# Patient Record
Sex: Male | Born: 1950
Health system: Southern US, Community
[De-identification: ages and names within clinical notes are randomized; demographics above are authoritative.]

## PROBLEM LIST (undated history)

## (undated) DIAGNOSIS — Z9889 Other specified postprocedural states: Secondary | ICD-10-CM

## (undated) DIAGNOSIS — Z860100 Personal history of colon polyps, unspecified: Secondary | ICD-10-CM

## (undated) DIAGNOSIS — I4891 Unspecified atrial fibrillation: Secondary | ICD-10-CM

## (undated) DIAGNOSIS — N400 Enlarged prostate without lower urinary tract symptoms: Secondary | ICD-10-CM

## (undated) DIAGNOSIS — R972 Elevated prostate specific antigen [PSA]: Secondary | ICD-10-CM

## (undated) DIAGNOSIS — E785 Hyperlipidemia, unspecified: Secondary | ICD-10-CM

## (undated) DIAGNOSIS — I10 Essential (primary) hypertension: Secondary | ICD-10-CM

## (undated) HISTORY — DX: Hyperlipidemia, unspecified: E78.5

## (undated) HISTORY — DX: Other specified postprocedural states: Z98.890

## (undated) HISTORY — DX: Benign prostatic hyperplasia without lower urinary tract symptoms: N40.0

## (undated) HISTORY — DX: Essential (primary) hypertension: I10

## (undated) HISTORY — DX: Personal history of colon polyps, unspecified: Z86.0100

## (undated) HISTORY — DX: Elevated prostate specific antigen (PSA): R97.20

## (undated) HISTORY — DX: Personal history of colonic polyps: Z86.010

## (undated) HISTORY — PX: APPENDECTOMY: SHX54

## (undated) HISTORY — PX: LIPOMA EXCISION: SHX5283

## (undated) HISTORY — PX: HERNIA REPAIR: SHX51

## (undated) HISTORY — PX: COLONOSCOPY W/ POLYPECTOMY: SHX1380

---

## 2005-05-14 ENCOUNTER — Ambulatory Visit: Payer: Self-pay | Admitting: Gastroenterology

## 2005-07-10 ENCOUNTER — Ambulatory Visit: Payer: Self-pay | Admitting: Gastroenterology

## 2005-07-10 ENCOUNTER — Encounter (INDEPENDENT_AMBULATORY_CARE_PROVIDER_SITE_OTHER): Payer: Self-pay | Admitting: *Deleted

## 2005-12-03 ENCOUNTER — Encounter: Admission: RE | Admit: 2005-12-03 | Discharge: 2005-12-03 | Payer: Self-pay | Admitting: Family Medicine

## 2006-02-26 ENCOUNTER — Ambulatory Visit: Payer: Self-pay | Admitting: Internal Medicine

## 2006-03-12 ENCOUNTER — Ambulatory Visit: Payer: Self-pay | Admitting: Internal Medicine

## 2006-10-14 ENCOUNTER — Ambulatory Visit: Payer: Self-pay | Admitting: Internal Medicine

## 2006-10-18 LAB — CONVERTED CEMR LAB: PSA: 5.95 ng/mL — ABNORMAL HIGH (ref 0.10–4.00)

## 2006-10-21 ENCOUNTER — Ambulatory Visit: Payer: Self-pay | Admitting: Internal Medicine

## 2007-03-16 ENCOUNTER — Ambulatory Visit: Payer: Self-pay | Admitting: Internal Medicine

## 2007-05-12 ENCOUNTER — Ambulatory Visit: Payer: Self-pay | Admitting: Internal Medicine

## 2007-05-12 DIAGNOSIS — R972 Elevated prostate specific antigen [PSA]: Secondary | ICD-10-CM

## 2007-05-12 DIAGNOSIS — E782 Mixed hyperlipidemia: Secondary | ICD-10-CM

## 2007-05-12 LAB — CONVERTED CEMR LAB
HDL goal, serum: 40 mg/dL
LDL Goal: 160 mg/dL

## 2007-05-16 LAB — CONVERTED CEMR LAB
BUN: 15 mg/dL (ref 6–23)
Creatinine, Ser: 0.9 mg/dL (ref 0.4–1.5)
Hgb A1c MFr Bld: 5.6 % (ref 4.6–6.0)
LDL Cholesterol: 96 mg/dL (ref 0–99)
Triglycerides: 68 mg/dL (ref 0–149)
VLDL: 14 mg/dL (ref 0–40)

## 2007-05-17 ENCOUNTER — Encounter (INDEPENDENT_AMBULATORY_CARE_PROVIDER_SITE_OTHER): Payer: Self-pay | Admitting: *Deleted

## 2007-12-14 ENCOUNTER — Ambulatory Visit: Payer: Self-pay | Admitting: Internal Medicine

## 2007-12-15 ENCOUNTER — Encounter (INDEPENDENT_AMBULATORY_CARE_PROVIDER_SITE_OTHER): Payer: Self-pay | Admitting: *Deleted

## 2008-01-14 ENCOUNTER — Encounter: Payer: Self-pay | Admitting: Internal Medicine

## 2008-02-21 ENCOUNTER — Ambulatory Visit (HOSPITAL_BASED_OUTPATIENT_CLINIC_OR_DEPARTMENT_OTHER): Admission: RE | Admit: 2008-02-21 | Discharge: 2008-02-21 | Payer: Self-pay | Admitting: Surgery

## 2008-02-21 ENCOUNTER — Encounter (INDEPENDENT_AMBULATORY_CARE_PROVIDER_SITE_OTHER): Payer: Self-pay | Admitting: Surgery

## 2008-03-10 ENCOUNTER — Encounter: Payer: Self-pay | Admitting: Internal Medicine

## 2008-05-15 ENCOUNTER — Ambulatory Visit: Payer: Self-pay | Admitting: Internal Medicine

## 2008-05-15 DIAGNOSIS — I10 Essential (primary) hypertension: Secondary | ICD-10-CM

## 2008-05-15 DIAGNOSIS — Z86010 Personal history of colonic polyps: Secondary | ICD-10-CM

## 2008-05-16 ENCOUNTER — Ambulatory Visit: Payer: Self-pay | Admitting: Internal Medicine

## 2008-05-22 ENCOUNTER — Encounter (INDEPENDENT_AMBULATORY_CARE_PROVIDER_SITE_OTHER): Payer: Self-pay | Admitting: *Deleted

## 2009-02-12 ENCOUNTER — Ambulatory Visit: Payer: Self-pay | Admitting: Internal Medicine

## 2009-05-17 ENCOUNTER — Ambulatory Visit: Payer: Self-pay | Admitting: Internal Medicine

## 2009-05-17 DIAGNOSIS — N400 Enlarged prostate without lower urinary tract symptoms: Secondary | ICD-10-CM

## 2009-05-21 ENCOUNTER — Encounter (INDEPENDENT_AMBULATORY_CARE_PROVIDER_SITE_OTHER): Payer: Self-pay | Admitting: *Deleted

## 2009-08-10 ENCOUNTER — Encounter (INDEPENDENT_AMBULATORY_CARE_PROVIDER_SITE_OTHER): Payer: Self-pay | Admitting: *Deleted

## 2009-08-10 ENCOUNTER — Telehealth: Payer: Self-pay | Admitting: Internal Medicine

## 2009-08-10 DIAGNOSIS — K4091 Unilateral inguinal hernia, without obstruction or gangrene, recurrent: Secondary | ICD-10-CM

## 2009-09-28 ENCOUNTER — Encounter: Payer: Self-pay | Admitting: Internal Medicine

## 2009-11-27 ENCOUNTER — Ambulatory Visit (HOSPITAL_BASED_OUTPATIENT_CLINIC_OR_DEPARTMENT_OTHER): Admission: RE | Admit: 2009-11-27 | Discharge: 2009-11-27 | Payer: Self-pay | Admitting: Surgery

## 2009-12-12 ENCOUNTER — Encounter: Payer: Self-pay | Admitting: Internal Medicine

## 2010-02-12 ENCOUNTER — Telehealth (INDEPENDENT_AMBULATORY_CARE_PROVIDER_SITE_OTHER): Payer: Self-pay | Admitting: *Deleted

## 2010-03-05 ENCOUNTER — Ambulatory Visit: Payer: Self-pay | Admitting: Internal Medicine

## 2010-05-19 DIAGNOSIS — Z9889 Other specified postprocedural states: Secondary | ICD-10-CM

## 2010-05-19 HISTORY — DX: Other specified postprocedural states: Z98.890

## 2010-06-10 ENCOUNTER — Telehealth (INDEPENDENT_AMBULATORY_CARE_PROVIDER_SITE_OTHER): Payer: Self-pay | Admitting: *Deleted

## 2010-06-14 ENCOUNTER — Ambulatory Visit
Admission: RE | Admit: 2010-06-14 | Discharge: 2010-06-14 | Payer: Self-pay | Source: Home / Self Care | Attending: Internal Medicine | Admitting: Internal Medicine

## 2010-06-14 ENCOUNTER — Other Ambulatory Visit: Payer: Self-pay | Admitting: Internal Medicine

## 2010-06-14 LAB — LIPID PANEL
Cholesterol: 178 mg/dL (ref 0–200)
HDL: 67.3 mg/dL (ref >39.00)
Triglycerides: 56 mg/dL (ref 0.0–149.0)
VLDL: 11.2 mg/dL (ref 0.0–40.0)

## 2010-06-14 LAB — CONVERTED CEMR LAB
Blood in Urine, dipstick: NEGATIVE
Glucose, Urine, Semiquant: NEGATIVE
Ketones, urine, test strip: NEGATIVE
Nitrite: NEGATIVE
Urobilinogen, UA: 0.2
WBC Urine, dipstick: NEGATIVE

## 2010-06-14 LAB — HEPATIC FUNCTION PANEL
ALT: 20 U/L (ref 0–53)
AST: 23 U/L (ref 0–37)
Alkaline Phosphatase: 49 U/L (ref 39–117)

## 2010-06-14 LAB — CBC WITH DIFFERENTIAL/PLATELET
Basophils Relative: 0.6 % (ref 0.0–3.0)
Eosinophils Absolute: 0.1 10*3/uL (ref 0.0–0.7)
Eosinophils Relative: 0.9 % (ref 0.0–5.0)
Lymphocytes Relative: 48.5 % — ABNORMAL HIGH (ref 12.0–46.0)
Lymphs Abs: 2.8 10*3/uL (ref 0.7–4.0)
MCV: 92.2 fl (ref 78.0–100.0)
Monocytes Absolute: 0.4 10*3/uL (ref 0.1–1.0)
RDW: 13.4 % (ref 11.5–14.6)

## 2010-06-14 LAB — BASIC METABOLIC PANEL
CO2: 28 mEq/L (ref 19–32)
Calcium: 9.2 mg/dL (ref 8.4–10.5)
Creatinine, Ser: 0.8 mg/dL (ref 0.4–1.5)
Glucose, Bld: 98 mg/dL (ref 70–99)

## 2010-06-14 LAB — TSH: TSH: 1.76 u[IU]/mL (ref 0.35–5.50)

## 2010-06-16 LAB — CONVERTED CEMR LAB
ALT: 20 units/L (ref 0–53)
ALT: 23 units/L (ref 0–53)
Albumin: 4.3 g/dL (ref 3.5–5.2)
BUN: 13 mg/dL (ref 6–23)
BUN: 15 mg/dL (ref 6–23)
Basophils Absolute: 0 10*3/uL (ref 0.0–0.1)
Basophils Absolute: 0 10*3/uL (ref 0.0–0.1)
Basophils Relative: 0.6 % (ref 0.0–3.0)
Bilirubin, Direct: 0.1 mg/dL (ref 0.0–0.3)
CO2: 29 meq/L (ref 19–32)
CO2: 30 meq/L (ref 19–32)
Calcium: 9.2 mg/dL (ref 8.4–10.5)
Calcium: 9.4 mg/dL (ref 8.4–10.5)
Chloride: 103 meq/L (ref 96–112)
Cholesterol: 179 mg/dL (ref 0–200)
Creatinine, Ser: 0.8 mg/dL (ref 0.4–1.5)
Creatinine, Ser: 0.8 mg/dL (ref 0.4–1.5)
Eosinophils Absolute: 0 10*3/uL (ref 0.0–0.7)
Eosinophils Absolute: 0.1 10*3/uL (ref 0.0–0.7)
Eosinophils Relative: 0.8 % (ref 0.0–5.0)
GFR calc non Af Amer: 105 mL/min
Hemoglobin: 14.8 g/dL (ref 13.0–17.0)
Lymphs Abs: 2.4 10*3/uL (ref 0.7–4.0)
MCHC: 33.9 g/dL (ref 30.0–36.0)
MCV: 91.9 fL (ref 78.0–100.0)
Monocytes Absolute: 0.4 10*3/uL (ref 0.1–1.0)
Monocytes Relative: 7.4 % (ref 3.0–12.0)
Neutro Abs: 2.7 10*3/uL (ref 1.4–7.7)
Neutrophils Relative %: 46.1 % (ref 43.0–77.0)
Neutrophils Relative %: 54.3 % (ref 43.0–77.0)
Platelets: 217 10*3/uL (ref 150–400)
Platelets: 223 10*3/uL (ref 150.0–400.0)
Potassium: 5 meq/L (ref 3.5–5.1)
RDW: 13.3 % (ref 11.5–14.6)
Total Bilirubin: 0.9 mg/dL (ref 0.3–1.2)
Total CHOL/HDL Ratio: 3.2
Triglycerides: 58 mg/dL (ref 0–149)
WBC: 5.6 10*3/uL (ref 4.5–10.5)

## 2010-06-20 NOTE — Progress Notes (Addendum)
Summary: Hernia Concerns  Phone Note Call from Patient Call back at Home Phone [phone] Call back at [phone]   Caller: Patient Summary of Call: Message left on VM, Patient was told by Dr.Arek Spadafore that he has a hernia and would like to know if we will contact Dr.Streck or should he.  Dr.Tyner Codner please advise  Shonna Chock  August 10, 2009 8:34 AM   New Problems: INGUINAL HERNIA (ICD-550.90)   New Problems: INGUINAL HERNIA (ICD-550.90)

## 2010-06-20 NOTE — Progress Notes (Addendum)
Summary: Cpx Lab Orders  Phone Note Call from Patient   Caller: Patient Summary of Call: Pt wants to come in for cpx labs on 1/27 (cpx 2/9). Please provide orders Initial call taken by: Lavell Islam,  June 10, 2010 9:26 AM  Follow-up for Phone Call        Lipid/Hep/BMP/CBCD/TSH/PSA/Udip/Stool Cards v70.0/272.2/401.9 Follow-up by: Shonna Chock CMA,  June 10, 2010 10:20 AM  Additional Follow-up for Phone Call Additional follow up Details #1::        orders added to appt Additional Follow-up by: Lavell Islam,  June 10, 2010 10:32 AM

## 2010-06-20 NOTE — Letter (Addendum)
Summary: Results Follow up Letter  [name] [facility]  [address] [place], [place] [address]   Phone: [phone]  Fax: [phone]    05/21/2009 MRN: [id number]  [name] [name] [address] [place], [place]  [address]  Dear Mr. Hevener,  The following are the results of your recent test(s):  Test         Result    Pap Smear:        Normal _____  Not Normal _____ Comments: ______________________________________________________ Cholesterol: LDL(Bad cholesterol):         Your goal is less than:         HDL (Good cholesterol):       Your goal is more than: Comments:  ______________________________________________________ Mammogram:        Normal _____  Not Normal _____ Comments:  ___________________________________________________________________ Hemoccult:        Normal _____  Not normal _______ Comments:    _____________________________________________________________________ Other Tests: Please see attached labs done on 05/17/2009    We routinely do not discuss normal results over the telephone.  If you desire a copy of the results, or you have any questions about this information we can discuss them at your next office visit.   Sincerely,

## 2010-06-20 NOTE — Letter (Addendum)
Summary: [facility]   Imported By: Lanelle Bal 12/25/2009 14:16:57  _____________________________________________________________________  External Attachment:    Type:   Image     Comment:   External Document

## 2010-06-20 NOTE — Progress Notes (Addendum)
Summary: spider bite  Phone Note Call from Patient Call back at Work Phone [phone]   Caller: Patient Summary of Call: patient called wanted to know what to do about spider bite  Follow-up for Phone Call        left message on machine needs to scheduled ov .......Marland KitchenDoristine Devoid CMA  February 12, 2010 1:34 PM   spoke w/ patient wife says that he is fine no fevers or muscle aches or weakness along w/ no streaking and spots are starting to fade away says that if he develops any further symptoms will come in to see Dr....Marland KitchenMarland KitchenDoristine Devoid CMA  February 13, 2010 11:37 AM

## 2010-06-20 NOTE — Consult Note (Addendum)
Summary: [facility]   Imported By: Lanelle Bal 10/19/2009 09:54:48  _____________________________________________________________________  External Attachment:    Type:   Image     Comment:   External Document

## 2010-06-20 NOTE — Letter (Addendum)
Summary: Primary Care Consult Scheduled Letter  [place] [facility] [place]  [address] [place], [place] [address]   Phone: [phone]  Fax: [phone]      08/10/2009 MRN: [id number]  Abhay Tworek [address] [place], [place]  [address]    Dear Mr. Besser,    We have scheduled an appointment for you.  At the recommendation of Dr. Marga Melnick, we have scheduled you a consult with Dr. Cyndia Bent of [facility] on 09-28-2009 arrive by 10:15am.  Their address is [address], [address], [place] [address]. The office phone number is [phone].  If this appointment day and time is not convenient for you, please feel free to call the office of the doctor you are being referred to at the number listed above and reschedule the appointment.    It is important for you to keep your scheduled appointments. We are here to make sure you are given good patient care.   Thank you,    Renee, Patient Care Coordinator [place] at [facility]

## 2010-06-20 NOTE — Assessment & Plan Note (Addendum)
Summary: flu shot//lch   Nurse Visit   Allergies: No Known Drug Allergies  Orders Added: 1)  Admin 1st Vaccine [90471] 2)  Flu Vaccine 54yrs + [29562] Flu Vaccine Consent Questions     Do you have a history of severe allergic reactions to this vaccine? no    Any prior history of allergic reactions to egg and/or gelatin? no    Do you have a sensitivity to the preservative Thimersol? no    Do you have a past history of Guillan-Barre Syndrome? no    Do you currently have an acute febrile illness? no    Have you ever had a severe reaction to latex? no    Vaccine information given and explained to patient? yes    Are you currently pregnant? no    Lot Number:[id number]   Exp Date:11/16/2010   Site Given  Left Deltoid IM .lbflu

## 2010-06-27 ENCOUNTER — Encounter: Payer: Self-pay | Admitting: Internal Medicine

## 2010-06-27 ENCOUNTER — Encounter (INDEPENDENT_AMBULATORY_CARE_PROVIDER_SITE_OTHER): Payer: BC Managed Care – PPO | Admitting: Internal Medicine

## 2010-06-27 DIAGNOSIS — R972 Elevated prostate specific antigen [PSA]: Secondary | ICD-10-CM

## 2010-06-27 DIAGNOSIS — N400 Enlarged prostate without lower urinary tract symptoms: Secondary | ICD-10-CM

## 2010-06-27 DIAGNOSIS — Z86010 Personal history of colonic polyps: Secondary | ICD-10-CM

## 2010-06-27 DIAGNOSIS — Z0000 Encounter for general adult medical examination without abnormal findings: Secondary | ICD-10-CM

## 2010-06-27 DIAGNOSIS — I10 Essential (primary) hypertension: Secondary | ICD-10-CM

## 2010-06-28 DIAGNOSIS — N400 Enlarged prostate without lower urinary tract symptoms: Secondary | ICD-10-CM | POA: Insufficient documentation

## 2010-07-01 ENCOUNTER — Telehealth (INDEPENDENT_AMBULATORY_CARE_PROVIDER_SITE_OTHER): Payer: Self-pay

## 2010-07-03 ENCOUNTER — Encounter (INDEPENDENT_AMBULATORY_CARE_PROVIDER_SITE_OTHER): Payer: Self-pay | Admitting: *Deleted

## 2010-07-04 NOTE — Miscellaneous (Signed)
Summary: Office Visit ([facility])    Vital Signs:  Patient profile:   60 year old male Height:      77 inches Weight:      224 pounds BMI:     26.66 Temp:     98.2 degrees F oral Pulse rate:   75 / minute Resp:     18 [name] [name] minute BP sitting:   122 / 80  (left arm)  Vitals Entered By: Jeremy Johann CMA (June 27, 2010 2:17 PM) CC: cpx   CC:  cpx.  History of Present Illness:    [name] [name] is here for a physical ; he is asymptomatic.    Hypertension Follow-Up:  The patient reports urinary frequency (in context of BPH), but denies lightheadedness, headaches, edema, and fatigue.  The patient denies the following associated symptoms: chest pain, chest pressure, exercise intolerance, dyspnea, palpitations, and syncope.  Compliance with medications (by patient report) has been near 100%.  The patient reports that dietary compliance has been good.  The patient reports exercising 5-6 X Trennon week.  He is not  restricting   salt .  Preventive Screening-Counseling & Management  Alcohol-Tobacco     Smoking Status: never  Caffeine-Diet-Exercise     Does Patient Exercise: yes  Current Medications (verified): 1)  Benazepril Hcl 20 Mg Tabs (Benazepril Hcl) .Marland Kitchen.. 1 By Mouth Qd  Allergies (verified): No Known Drug Allergies  Past History:  Past Medical History: Central Retinal Occlusion , Dr Charlann Noss; Elevated PSA, (  3.51 in 2003;  2.68 in  2006;  3.87 in 2007; 5.95 in 2008; 4.36 in 2010) Note: he has not pursued  Urologic evaluation to date due to the conflicting literature recommendations  Colonic polyps, PMH  of, Hyperplastic 2003, 2007, Dr Victorino Dike Hypertension Benign prostatic hypertrophy  Past Surgical History: Lipomas resected from R neck Appendectomy Colon polypectomy Inguinal herniorrhaphy 2009 , 2011, Dr Cicero Duck   Family History: Father: MI @ 38 Mother: Alzheimer's Siblings: bro: HTN; MGM: HTN No FH of prostate cancer  Social  History: Occupation:Business Owner/ [employer] Married Never Smoked Alcohol use-yes: 2-3 / day Regular exercise-yes Smoking Status:  never Does Patient Exercise:  yes  Review of Systems  The patient denies anorexia, fever, weight loss, weight gain, vision loss, hoarseness, prolonged cough, hemoptysis, abdominal pain, melena, hematochezia, severe indigestion/heartburn, hematuria, suspicious skin lesions, depression, unusual weight change, enlarged lymph nodes, and angioedema.         Some incomplete voiding.    Physical Exam  General:  well-nourished;alert,appropriate and cooperative throughout examination Head:  Normocephalic and atraumatic without obvious abnormalities.  Pattern  alopecia  Eyes:  No corneal or conjunctival inflammation noted. Perrla. Funduscopic exam benign, without hemorrhages, exudates or papilledema. Ears:  External ear exam shows no significant lesions or deformities.  Otoscopic examination reveals clear canals, tympanic membranes are intact bilaterally without bulging, retraction, inflammation or discharge. Hearing is grossly normal bilaterally. Nose:  External nasal examination shows no deformity or inflammation. Nasal mucosa are pink and moist without lesions or exudates. septal dislocation & deviation Mouth:  Oral mucosa and oropharynx without lesions or exudates.  Teeth in good repair. Neck:  No deformities, masses, or tenderness noted. Lungs:  Normal respiratory effort, chest expands symmetrically. Lungs are clear to auscultation, no crackles or wheezes. Heart:  regular rhythm, no murmur, no gallop, no rub, no JVD, no HJR, and bradycardia.  S4 Abdomen:  Bowel sounds positive,abdomen soft and non-tender without masses, organomegaly or hernias noted. Rectal:  No external abnormalities noted. Normal sphincter tone. No rectal masses or tenderness. Genitalia:  Testes bilaterally descended without nodularity, tenderness or masses. No scrotal masses or lesions. R testicle >  L.No penis lesions or urethral discharge. Prostate:  1.5 -2 + enlarged.  no nodules, no asymmetry, no induration Msk:  Upper back Epidermoid Inclusion Cyst Pulses:  R and L carotid,radial,dorsalis pedis and posterior tibial pulses are full and equal bilaterally Extremities:  No clubbing, cyanosis, edema, or deformity noted with normal full range of motion of all joints.   Neurologic:  alert & oriented X3 and DTRs symmetrical and normal.   Skin:  Intact without suspicious lesions or rashes Cervical Nodes:  No lymphadenopathy noted Axillary Nodes:  No palpable lymphadenopathy Inguinal Nodes:  No significant adenopathy Psych:  memory intact for recent and remote, normally interactive, and good eye contact.     Impression & Recommendations:  Problem # 1:  ROUTINE GENERAL MEDICAL EXAM@HEALTH  CARE FACL (ICD-V70.0)  Orders: EKG w/ Interpretation (93000)  Problem # 2:  HYPERTENSION (ICD-401.9) controlled His updated medication list for this problem includes:    Benazepril Hcl 20 Mg Tabs (Benazepril hcl) .Marland Kitchen... 1 by mouth qd  Problem # 3:  PROSTATE SPECIFIC ANTIGEN, ELEVATED (ICD-790.93)  Options discussed; I recommend consultation with Dr Crecencio Mc , Urology, to optimally assess risks & options  Orders: Urology Referral (Urology)  Problem # 4:  BENIGN PROSTATIC HYPERTROPHY (ICD-600.00)  Orders: Urology Referral (Urology)  Problem # 5:  COLONIC POLYPS, HX OF (ICD-V12.72) Hyperplastic, last Colonoscopy 2007, Dr Victorino Dike  Complete Medication List: 1)  Benazepril Hcl 20 Mg Tabs (Benazepril hcl) .Marland Kitchen.. 1 by mouth qd   Patient Instructions: 1)  Referral will be made with Dr Laverle Patter as discussed. I'll verify survelliance interval for Colonoscopy. ]

## 2010-07-05 ENCOUNTER — Encounter: Payer: Self-pay | Admitting: Gastroenterology

## 2010-07-10 NOTE — Miscellaneous (Signed)
Summary: [facility]  Clinical Lists Changes  Medications: Added new medication of MOVIPREP 100 GM  SOLR (PEG-KCL-NACL-NASULF-NA ASC-C) As [place] prep instructions. - Signed Rx of MOVIPREP 100 GM  SOLR (PEG-KCL-NACL-NASULF-NA ASC-C) As [place]ee prep instructions.;  #1 x 0;  Signed;  Entered by: Ezra Sites RN;  Authorized by: Louis Meckel MD;  Method used: Electronically to [facility]*, [address] [place], [place], [place]  [address], Ph: [phone], Fax: [phone] Observations: Added new observation of NKA: T (07/05/2010 13:44)    Prescriptions: MOVIPREP 100 GM  SOLR (PEG-KCL-NACL-NASULF-NA ASC-C) As Daryan prep instructions.  #1 x 0   Entered by:   Ezra Sites RN   Authorized by:   Louis Meckel MD   Signed by:   Ezra Sites RN on 07/05/2010   Method used:   Electronically to        [facility]* (retail)       [address] [place], [place]  [address]       Ph: [phone]       Fax: [phone]   RxID:   [id number]

## 2010-07-10 NOTE — Letter (Signed)
Summary: [facility] Instructions  [facility]  [address] [place], [place] [address]   Phone: [phone]  Fax: [phone]       [name] [name]    04/01/1951    MRN: [id number]        Procedure Day /Date: Thursday 07/11/2010     Arrival Time: 9:30 am     Procedure Time: 10:30 am     Location of Procedure:                    _x _  [facility] (4th Floor)                        PREPARATION FOR COLONOSCOPY WITH MOVIPREP   Starting 5 days prior to your procedure Saturday 2/18 do not eat nuts, seeds, popcorn, corn, beans, peas,  salads, or any raw vegetables.  Do not take any fiber supplements (e.g. Metamucil, Citrucel, and Benefiber).  THE DAY BEFORE YOUR PROCEDURE         DATE: Wednesday 2/22  1.  Drink clear liquids the entire day-NO SOLID FOOD  2.  Do not drink anything colored red or purple.  Avoid juices with pulp.  No orange juice.  3.  Drink at least 64 oz. (8 glasses) of fluid/clear liquids during the day to prevent dehydration and help the prep work efficiently.  CLEAR LIQUIDS INCLUDE: Water Jello Ice Popsicles Tea (sugar ok, no milk/cream) Powdered fruit flavored drinks Coffee (sugar ok, no milk/cream) Gatorade Juice: apple, white grape, white cranberry  Lemonade Clear bullion, consomm, broth Carbonated beverages (any kind) Strained chicken noodle soup Hard Candy                             4.  In the morning, mix first dose of MoviPrep solution:    Empty 1 Pouch A and 1 Pouch B into the disposable container    Add lukewarm drinking water to the top line of the container. Mix to dissolve    Refrigerate (mixed solution should be used within 24 hrs)  5.  Begin drinking the prep at 5:00 p.m. The MoviPrep container is divided by 4 marks.   Every 15 minutes drink the solution down to the next mark (approximately 8 oz) until the full liter is complete.   6.  Follow completed prep with 16 oz of clear liquid of your choice  (Nothing red or purple).  Continue to drink clear liquids until bedtime.  7.  Before going to bed, mix second dose of MoviPrep solution:    Empty 1 Pouch A and 1 Pouch B into the disposable container    Add lukewarm drinking water to the top line of the container. Mix to dissolve    Refrigerate  THE DAY OF YOUR PROCEDURE      DATE: Thursday 2/23  Beginning at 5:30 a.m. (5 hours before procedure):         1. Every 15 minutes, drink the solution down to the next mark (approx 8 oz) until the full liter is complete.  2. Follow completed prep with 16 oz. of clear liquid of your choice.    3. You may drink clear liquids until 8:30 am (2 HOURS BEFORE PROCEDURE).   MEDICATION INSTRUCTIONS  Unless otherwise instructed, you should take regular prescription medications with a small sip of water   as early as possible the morning of your procedure.  OTHER INSTRUCTIONS  You will need a responsible adult at least 62 years of age to accompany you and drive you home.   This person must remain in the waiting room during your procedure.  Wear loose fitting clothing that is easily removed.  Leave jewelry and other valuables at home.  However, you may wish to bring a book to read or  an iPod/MP3 player to listen to music as you wait for your procedure to start.  Remove all body piercing jewelry and leave at home.  Total time from sign-in until discharge is approximately 2-3 hours.  You should go home directly after your procedure and rest.  You can resume normal activities the  day after your procedure.  The day of your procedure you should not:   Drive   Make legal decisions   Operate machinery   Drink alcohol   Return to work  You will receive specific instructions about eating, activities and medications before you leave.    The above instructions have been reviewed and explained to me by   Ezra Sites RN  July 05, 2010 2:13 PM    I fully understand  and can verbalize these instructions _____________________________ Date _________

## 2010-07-10 NOTE — Procedures (Signed)
Summary: Colon  Patient Name: [name], [name] Val [place] MRN:  Procedure Procedures: Colonoscopy CPT: [id number].  Personnel: Endoscopist: Ulyess Mort, MD.  Exam Location: Exam performed in Outpatient Clinic. Outpatient  Patient Consent: Procedure, Alternatives, Risks and Benefits discussed, consent obtained, from patient. Consent was obtained by the RN.  Indications  Surveillance of: Adenomatous Polyp(s).  History  Current Medications: Patient is not currently taking Coumadin.  Pre-Exam Physical: Performed Jan 11, 2002. Entire physical exam was normal. Rectal exam abnormal. Abdominal exam, Extremity exam, Mental status exam WNL. Abnormal PE findings include: hems. and old scarring  prev. hx of fistula.  Comments: Pt. history reviewed/updated, physical exam performed prior to initiation of sedation? Exam Exam: Extent of exam reached: Cecum, extent intended: Cecum.  Colon retroflexion performed. Images taken. ASA Classification: II. Tolerance: good.  Monitoring: Pulse and BP monitoring, Oximetry used. Supplemental O2 given.  Colon Prep Prep results: good.  Sedation Meds: Patient assessed and found to be appropriate for moderate (conscious) sedation. Fentanyl given IV. Versed given IV.  Findings POLYP: Cecum, Maximum size: 3 mm. sessile polyp. Procedure:  hot biopsy, removed, retrieved, Polyp sent to pathology.  POLYP: Cecum, Maximum size: 2 mm. sessile polyp. Procedure:  hot biopsy, removed, not retrieved,  - HEMORRHOIDS: External. Size: Grade I. ICD9: Hemorrhoids, External: 455.3. Comments: no fistulae seen.   Assessment Abnormal examination, see findings above.  Diagnoses: 455.3: Hemorrhoids, External.   Events  Unplanned Interventions: No intervention was required.  Unplanned Events: There were no complications. Plans Medication Plan: Await pathology.  Patient Education: Patient given standard instructions for: Polyps. Hemorrhoids. Yearly hemoccult testing  recommended. Patient instructed to get routine colonoscopy every 7 years.  Disposition: After procedure patient sent to recovery. After recovery patient sent home.   CC:  [name] Rud, MD This report was created from the original endoscopy report, which was reviewed and signed by the above listed endoscopist.

## 2010-07-10 NOTE — Progress Notes (Signed)
Summary: colon   ---- Converted from flag ---- ---- 07/01/2010 8:43 AM, Merri Ray CMA (AAMA) wrote:   ---- 07/01/2010 8:40 AM, Louis Meckel MD wrote: Yes. An adenomatous polyp was removed in 2007.  I will get it scheduled. Rob  ---- 06/28/2010 6:20 AM, Marga Melnick MD wrote: Cleone Slim, is Eluzer due for Colonoscopy ? Thanks, Hopp ------------------------------       Additional Follow-up for Phone Call Additional follow up Details #2::    Left message to call back Follow-up by: Selinda Michaels RN,  July 01, 2010 3:33 PM  Additional Follow-up for Phone Call Additional follow up Details #3:: Details for Additional Follow-up Action Taken: Patient scheduled for previsit 07/05/10@2pm , colon scheduled for 07/11/10@10 :30am. Patient aware of appointment date and time. Additional Follow-up by: Selinda Michaels RN,  July 02, 2010 3:48 PM

## 2010-07-10 NOTE — Letter (Signed)
Summary: [facility] Risk Profile  [facility] Risk Profile   Imported By: Maryln Gottron 07/02/2010 10:19:47  _____________________________________________________________________  External Attachment:    Type:   Image     Comment:   External Document

## 2010-07-11 ENCOUNTER — Encounter: Payer: Self-pay | Admitting: Gastroenterology

## 2010-07-11 ENCOUNTER — Other Ambulatory Visit (AMBULATORY_SURGERY_CENTER): Payer: BC Managed Care – PPO | Admitting: Gastroenterology

## 2010-07-11 DIAGNOSIS — D175 Benign lipomatous neoplasm of intra-abdominal organs: Secondary | ICD-10-CM

## 2010-07-11 DIAGNOSIS — Z1211 Encounter for screening for malignant neoplasm of colon: Secondary | ICD-10-CM

## 2010-07-11 DIAGNOSIS — Z86010 Personal history of colonic polyps: Secondary | ICD-10-CM

## 2010-07-11 DIAGNOSIS — K5730 Diverticulosis of large intestine without perforation or abscess without bleeding: Secondary | ICD-10-CM

## 2010-07-16 NOTE — Procedures (Signed)
Summary: Colonoscopy  Patient: [name] Note: All result statuses are Final unless otherwise noted.  Tests: (1) Colonoscopy (COL)   COL Colonoscopy           DONE     [facility]     [address]     [place], [place]  [address]           COLONOSCOPY PROCEDURE REPORT           PATIENT:  [name] [name], [name] [name]  MR#:  [id number]     BIRTHDATE:  1950-11-04, 59 yrs. old  GENDER:  male           ENDOSCOPIST:  Barbette Hair. Arlyce Dice, MD     Referred by:           PROCEDURE DATE:  07/11/2010     PROCEDURE:  Diagnostic Colonoscopy     ASA CLASS:  Class II     INDICATIONS:  1) screening  2) history of pre-cancerous     (adenomatous) colon polyps Index polypectomy 2007           MEDICATIONS:   Fentanyl 50 mcg IV, Versed 5 mg IV           DESCRIPTION OF PROCEDURE:   After the risks benefits and     alternatives of the procedure were thoroughly explained, informed     consent was obtained.  Digital rectal exam was performed and     revealed no abnormalities.   The [id number] endoscope was     introduced through the anus and advanced to the cecum, which was     identified by the ileocecal valve, without limitations.  The     quality of the prep was excellent, using MoviPrep.  The instrument     was then slowly withdrawn as the colon was fully examined.     <<PROCEDUREIMAGES>>           FINDINGS:  A lipoma was found in the cecum (see image2). 1cm     submucosal firm, yellowish mass  Mild diverticulosis was found in     the sigmoid colon (see image12).  This was otherwise a normal     examination of the colon (see image3, image4, image6, image7,     image8, image14, and image15).   Retroflexed views in the rectum     revealed no abnormalities.    The time to cecum =  2.25  minutes.     The scope was then withdrawn (time =  8.75  min) from the patient     and the procedure completed.           COMPLICATIONS:  None           ENDOSCOPIC IMPRESSION:     1) Lipoma in the cecum     2)  Mild diverticulosis in the sigmoid colon     3) Otherwise normal examination     RECOMMENDATIONS:     1) Colonoscopy 10 years           REPEAT EXAM:   10 year(s) Colonoscopy           ______________________________     Barbette Hair. Arlyce Dice, MD           CC:  Pecola Lawless, MD           n.     Rosalie Doctor:   Barbette Hair. Caspar Favila at 07/11/2010 11:15 AM  Heinkel, Vann, [phone]  Note: An exclamation mark (!) indicates a result that was not dispersed into the flowsheet. Document Creation Date: 07/11/2010 11:15 AM _______________________________________________________________________  (1) Order result status: Final Collection or observation date-time: 07/11/2010 11:07 Requested date-time:  Receipt date-time:  Reported date-time:  Referring Physician:   Ordering Physician: Melvia Heaps [phone]) Specimen Source:  Source: Launa Grill Order Number: [id number] Lab site:   Appended Document: Colonoscopy    Clinical Lists Changes  Observations: Added new observation of COLONNXTDUE: 06/2020 (07/11/2010 16:28)

## 2010-08-04 LAB — POCT HEMOGLOBIN-HEMACUE: Hemoglobin: 15.4 g/dL (ref 13.0–17.0)

## 2010-08-04 LAB — BASIC METABOLIC PANEL
BUN: 16 mg/dL (ref 6–23)
CO2: 29 mEq/L (ref 19–32)
Creatinine, Ser: 0.83 mg/dL (ref 0.4–1.5)
GFR calc non Af Amer: >60 mL/min (ref >60)
Sodium: 140 mEq/L (ref 135–145)

## 2010-09-17 HISTORY — PX: PROSTATE BIOPSY: SHX241

## 2010-09-18 ENCOUNTER — Other Ambulatory Visit: Payer: Self-pay | Admitting: Internal Medicine

## 2010-10-01 NOTE — Op Note (Signed)
NAME:  [name] [name], [name] [name]                ACCOUNT NO.:  [id number]   MEDICAL RECORD NO.:  [id number]          PATIENT TYPE:  AMB   LOCATION:  DSC                          FACILITY:  [facility]   PHYSICIAN:  Currie Paris, M.D.DATE OF BIRTH:  [birth year]   DATE OF PROCEDURE:  02/21/2008  DATE OF DISCHARGE:                               OPERATIVE REPORT   PREOPERATIVE DIAGNOSES:  1. Left inguinal hernia.  2. Dysplastic nevus, left lower quadrant.   POSTOPERATIVE DIAGNOSES:  1. Left inguinal hernia.  2. Dysplastic nevus, left lower quadrant.   OPERATION:  Repair of left inguinal hernia with mesh; excision of  dysplastic nevus site (3-cm incision).   SURGEON:  Currie Paris, MD   ANESTHESIA:  General.   CLINICAL HISTORY:  This is a 60 year old gentleman who has presented  with a fairly large, but reducible left inguinal hernia that he wished  to have repaired.  He is noted that his left testis was a bit smaller  than the right, but other than that no other symptoms.  He had also had  a dysplastic nevus removed by Dr. Venancio Poisson, and this was from the  left lower quadrant, and this would need a wider excision.  After  discussion with the patient, we elected to proceed to repair as well as  re-excision of the nevus site.   DESCRIPTION OF PROCEDURE:  The patient was seen in the holding area, and  he had no further questions.  I initialed left inguinal area as the  operative site for the hernia repair and marked the nevus excision scar  both with the patient's cooperation.   The patient was then taken to the operating room.  After satisfactory  general anesthesia had been obtained, the left lower quadrant was  clipped, prepped, and draped.  The time-out was done.   I used 0.25% plain Marcaine for to help with postop anesthesia.  I  infiltrated the area of the nevus as well as the skin and subcu tissues  at the incision site for the hernia repair as well as below the fascia.   The nevus scar was oriented somewhat obliquely transversely, and I did  an elliptical incision taking about a 2-mm margin on either side.  This  was done with knife and then cautery.  I oriented the specimen for  pathology.  I closed the incision with 3-0 Vicryl and 4-0 Monocryl  subcuticular plus Dermabond.   Attention was turned to the inguinal hernia and a standard angle oblique  incision was made and deepened the extra oblique aponeurosis with  bleeders tied or cauterized.  The external oblique aponeurosis was noted  to be thinned out with a fairly dilated superficial ring.   The self-retaining retractors in place.  I opened the external oblique  in line of its fibers.  I was able to get the cord structures up off the  inguinal floor and there was a fairly large protrusion of the hernia and  initially was unclear whether this was direct or indirect.  As I  continued to open the splayed out muscle  fibers on the cord, I was able  to clearly identify that this was a very large indirect sac with what  appeared to be some colon coming out in it.  Eventually, I was able to  get this stripped off enough likely to identify clearly a sac edge.  There appeared to be a complex sac with a narrow neck with a small piece  of omentum out in it as well as a broader part.  I opened this up so I  could expose this and found that did indeed have an appendix epiploica  that was out and a tiny sac component and then the main sac which was a  broad based one.  At first, I thought this might represent a sliding  hernia, but on careful inspection and freeing up, I saw that the colon  was simply stuck to the wall of the hernia sac, and I was able to  sharply free up all of this.   I then put a purse-string and closed the sac so that we had this nicely  reduced.   There was a lot of other of fatty tissue coming through and I was able  to divide this and tied off at its base to try to narrow the amount of   tissue coming through the deep ring.  I got this down to where I could  clearly see the vessels and the vas, but very little cremaster or other  tissue.  I then freed up and cleaned off the deeper tissues to be sure  that we had an intact floor.  This was clearly all coming out lateral to  the epigastric vessels,  which I could identify nicely.   I then closed the deep ring was some sutures of Prolene to get this  narrowed down.  I then put a Marlex mesh patch on as an onlay graft to  do a tension-free repair to cover the entire floor as well as to go  laterally and beyond the deep ring.  This was sutured in with a running  suture inferiorly starting at the pubic tubercle and running laterally  to the deep ring and then tacked medially well up under the external  oblique and onto the internal oblique.  What I had identified as the  ilioinguinal was allowed to come through the split and the mesh to stay  with the cord structures.  I put more local in as I worked.   Once the repair was completed, I irrigated and checked for hemostasis,  and everything appeared to be dry.  I then closed using 3-0 Vicryl to  close the external oblique and Scarpa's and 4-0 Monocryl subcuticular  plus Dermabond for the skin.   The patient tolerated the procedure well and there were no  complications.  All counts were correct.      Currie Paris, M.D.  Electronically Signed     CJS/MEDQ  D:  02/21/2008  T:  02/21/2008  Job:  [id number]   cc:   Titus Dubin. Alwyn Ren, MD,FACP,FCCP

## 2011-02-18 LAB — BASIC METABOLIC PANEL
Glucose, Bld: 112 — ABNORMAL HIGH
Potassium: 4.7

## 2011-05-06 ENCOUNTER — Ambulatory Visit (INDEPENDENT_AMBULATORY_CARE_PROVIDER_SITE_OTHER): Payer: BC Managed Care – PPO | Admitting: Internal Medicine

## 2011-05-06 VITALS — BP 138/88 | HR 72 | Temp 98.6°F | Wt 224.8 lb

## 2011-05-06 DIAGNOSIS — J019 Acute sinusitis, unspecified: Secondary | ICD-10-CM

## 2011-05-06 MED ORDER — BENZONATATE 200 MG PO CAPS: 200.0000 mg | ORAL_CAPSULE | Freq: Two times a day (BID) | ORAL | Status: AC | PRN

## 2011-05-06 MED ORDER — AMOXICILLIN 500 MG PO CAPS: 500.0000 mg | ORAL_CAPSULE | Freq: Three times a day (TID) | ORAL | Status: AC

## 2011-05-06 NOTE — Progress Notes (Signed)
  Subjective:    Patient ID: [name] [name], male    DOB: 1950/07/15, 60 y.o.   MRN: [id number]  HPI Respiratory tract infection Onset/symptoms:04/19/2011 with fever, chills, and headache; cough and nasal congestion on 04/20/2011  Exposures (illness/environmental/extrinsic): no Progression of symptoms:Began with fever, chills and headache, then non-productive cough and nasal mucous production. Headache persisted  Treatments/response:Mucinex DM/beneficial, Tylenol/beneficial but sweats occurred while taking, Claritin/not beneficial  Present symptoms: Fever/chills/sweats:No Frontal headache:Yes, R frontal Facial pain:No Nasal purulence:Mostly clear but sometimes yellow Sore throat:No  Dental pain:Yes, Right Side Lymphadenopathy:No Wheezing/shortness of breath:No Cough/sputum/hemoptysis:No, dry cough Pleuritic pain:No  Associated extrinsic/allergic symptoms:itchy eyes/ sneezing:No Past medical history: Seasonal allergies/asthma:No Smoking history:Non smoker               Review of Systems     Objective:   Physical Exam General appearance is of good health and nourishment; no acute distress or increased work of breathing is present.  No  lymphadenopathy about the head, neck, or axilla noted.   Eyes: No conjunctival inflammation or lid edema is present. EOMI.  Ears:  External ear exam shows no significant lesions or deformities.  Otoscopic examination reveals clear canals, tympanic membranes are intact bilaterally without bulging, retraction, inflammation or discharge.  Nose:  External nasal examination shows no deformity or inflammation. Nasal mucosa are pink and moist without lesions or exudates. No septal dislocation .No obstruction to airflow.   Oral exam: Dental hygiene is good; lips and gums are healthy appearing.There is no oropharyngeal erythema or exudate noted.      Heart:  Normal rate and regular rhythm. S1 and S2 normal without gallop, murmur, click, rub.S  4 Lungs:Chest clear to auscultation; no wheezes, rhonchi,rales ,or rubs present.No increased work of breathing.    Extremities:  No cyanosis, edema, or clubbing  noted    Skin: Warm & dry         Assessment & Plan:   #1 right frontal and maxillary sinusitis  #2 dry cough; he is on an ACE inhibitor, but the cough is achieved.  Plan: See orders and recommendations

## 2011-05-06 NOTE — Patient Instructions (Signed)
Plain Mucinex for thick secretions ;force NON dairy fluids. Use a Neti pot daily as needed for sinus congestion  

## 2011-06-13 ENCOUNTER — Encounter: Payer: Self-pay | Admitting: Internal Medicine

## 2011-06-13 ENCOUNTER — Ambulatory Visit (INDEPENDENT_AMBULATORY_CARE_PROVIDER_SITE_OTHER): Payer: BC Managed Care – PPO | Admitting: Internal Medicine

## 2011-06-13 VITALS — BP 114/80 | HR 70 | Temp 97.9°F | Resp 12 | Ht 77.0 in | Wt 222.8 lb

## 2011-06-13 DIAGNOSIS — Z0000 Encounter for general adult medical examination without abnormal findings: Secondary | ICD-10-CM

## 2011-06-13 DIAGNOSIS — E782 Mixed hyperlipidemia: Secondary | ICD-10-CM

## 2011-06-13 DIAGNOSIS — I10 Essential (primary) hypertension: Secondary | ICD-10-CM

## 2011-06-13 LAB — BASIC METABOLIC PANEL
Calcium: 9.2 mg/dL (ref 8.4–10.5)
GFR: 95.54 mL/min (ref >60.00)
Glucose, Bld: 98 mg/dL (ref 70–99)
Potassium: 4.8 mEq/L (ref 3.5–5.1)

## 2011-06-13 LAB — CBC WITH DIFFERENTIAL/PLATELET
Basophils Absolute: 0 10*3/uL (ref 0.0–0.1)
Eosinophils Absolute: 0 10*3/uL (ref 0.0–0.7)
Lymphs Abs: 1.8 10*3/uL (ref 0.7–4.0)
MCHC: 34 g/dL (ref 30.0–36.0)
MCV: 90 fl (ref 78.0–100.0)
Monocytes Absolute: 0.4 10*3/uL (ref 0.1–1.0)
Neutro Abs: 2.5 10*3/uL (ref 1.4–7.7)
Platelets: 236 10*3/uL (ref 150.0–400.0)
WBC: 4.7 10*3/uL (ref 4.5–10.5)

## 2011-06-13 LAB — LIPID PANEL
HDL: 62.9 mg/dL (ref >39.00)
Total CHOL/HDL Ratio: 3
Triglycerides: 48 mg/dL (ref 0.0–149.0)

## 2011-06-13 LAB — HEPATIC FUNCTION PANEL
AST: 19 U/L (ref 0–37)
Albumin: 4.4 g/dL (ref 3.5–5.2)
Bilirubin, Direct: 0 mg/dL (ref 0.0–0.3)
Total Bilirubin: 0.8 mg/dL (ref 0.3–1.2)

## 2011-06-13 NOTE — Assessment & Plan Note (Signed)
Blood pressures been well-controlled at home in the range of 120-130/70-80.

## 2011-06-13 NOTE — Progress Notes (Signed)
Subjective:    Patient ID: [name] [name], male    DOB: [birth year], 61 y.o.   MRN: [id number]  HPI  Mr. Mears is here for a physical; he denies acute issues .      Review of Systems Patient reports no new  vision/ hearing changes,anorexia, weight change, fever ,adenopathy, persistant / recurrent hoarseness, swallowing issues, chest pain,palpitations, edema,persistant / recurrent cough, hemoptysis, dyspnea(rest, exertional, paroxysmal nocturnal), gastrointestinal  bleeding (melena, rectal bleeding), abdominal pain, excessive heart burn, GU symptoms( dysuria, hematuria, pyuria, voiding/incontinence  issues) syncope, focal weakness, memory loss,numbness & tingling, skin/hair/nail changes,depression, anxiety, abnormal bruising/bleeding,or  musculoskeletal symptoms/signs.      Objective:   Physical Exam Gen.: Healthy and well-nourished in appearance. Alert, appropriate and cooperative throughout exam. Head: Normocephalic without obvious abnormalities; pattern alopecia  Eyes: No corneal or conjunctival inflammation noted. Pupils equal round reactive to light and accommodation. Fundal exam is benign without hemorrhages, exudate, papilledema. Extraocular motion intact. Vision grossly decreased OS. Ears: External  ear exam reveals no significant lesions or deformities. Canals clear .TMs normal. Hearing is grossly normal bilaterally. Nose: External nasal exam reveals no deformity or inflammation. Nasal mucosa are pink and moist. No lesions or exudates noted. Septum  Deviated to R  Mouth: Oral mucosa and oropharynx reveal no lesions or exudates. Teeth in good repair. Neck: No deformities, masses, or tenderness noted. Range of motion & Thyroid normal. Lungs: Normal respiratory effort; chest expands symmetrically. Lungs are clear to auscultation without rales, wheezes, or increased work of breathing. Heart: Normal rate and rhythm. Normal S1 and S2. No gallop, click, or rub. S 4 w/o  murmur. Abdomen:  Bowel sounds normal; abdomen soft and nontender. No masses, organomegaly or hernias noted. Genitalia: Dr Laverle Patter   .                                                                                   Musculoskeletal/extremities: No deformity or scoliosis noted of  the thoracic or lumbar spine. No clubbing, cyanosis, edema, or deformity noted. Range of motion  normal .Tone & strength  normal.Joints normal. Nail health  good. Vascular: Carotid, radial artery, dorsalis pedis and  posterior tibial pulses are full and equal. No bruits present. Large varicosities left upper, medial calf area. Neurologic: Alert and oriented x3. Deep tendon reflexes symmetrical and normal.          Skin: Intact without suspicious lesions or rashes. Large epidermoid inclusion cyst upper back. Multiple subcutaneous lipoma of trunk and extremities. Lymph: No cervical, axillary lymphadenopathy present. Psych: Mood and affect are normal. Normally interactive                                                                                         Assessment & Plan:  #1 comprehensive physical exam; no acute findings #2 see Problem List with  Assessments & Recommendations Plan: see Orders

## 2011-06-13 NOTE — Patient Instructions (Addendum)
Preventive Health Care: Exercise at least 30-45 minutes a day,  3-4 days a week.  Eat a low-fat diet with lots of fruits and vegetables, up to 7-9 servings Christropher day. Consume less than 40 grams of sugar Cruzito day from foods & drinks with High Fructose Corn Sugar as # 1,2,3 or # 4 on label. Blood Pressure Goal  Ideally is an AVERAGE < 135/85. This AVERAGE should be calculated from @ least 5-7 BP readings taken @ different times of day on different days of week. You should not respond to isolated BP readings , but rather the AVERAGE for that week   

## 2011-07-11 ENCOUNTER — Other Ambulatory Visit: Payer: Self-pay | Admitting: Internal Medicine

## 2011-10-02 ENCOUNTER — Ambulatory Visit (INDEPENDENT_AMBULATORY_CARE_PROVIDER_SITE_OTHER): Payer: BC Managed Care – PPO

## 2011-10-02 DIAGNOSIS — Z23 Encounter for immunization: Secondary | ICD-10-CM

## 2012-03-23 ENCOUNTER — Other Ambulatory Visit: Payer: Self-pay | Admitting: Internal Medicine

## 2012-07-22 ENCOUNTER — Other Ambulatory Visit: Payer: Self-pay | Admitting: Internal Medicine

## 2012-08-13 ENCOUNTER — Encounter: Payer: Self-pay | Admitting: Internal Medicine

## 2012-08-13 ENCOUNTER — Ambulatory Visit (INDEPENDENT_AMBULATORY_CARE_PROVIDER_SITE_OTHER): Payer: BC Managed Care – PPO | Admitting: Internal Medicine

## 2012-08-13 VITALS — BP 126/78 | HR 71 | Temp 98.2°F | Resp 12 | Ht 76.07 in | Wt 229.8 lb

## 2012-08-13 DIAGNOSIS — H547 Unspecified visual loss: Secondary | ICD-10-CM

## 2012-08-13 DIAGNOSIS — H543 Unqualified visual loss, both eyes: Secondary | ICD-10-CM

## 2012-08-13 DIAGNOSIS — Z0000 Encounter for general adult medical examination without abnormal findings: Secondary | ICD-10-CM

## 2012-08-13 NOTE — Progress Notes (Signed)
  Subjective:    Patient ID: [name] [name], male    DOB: [birth year], 62 y.o.   MRN: [id number]  HPI He is here for a physical; he denies acute issues .     Review of Systems   He and Dr. Laverle Patter are conducting a watchful monitor of his elevated PSA. Dr. Vevelyn Royals 07/2012 office visit note was reviewed.     Objective:   Physical Exam Gen.: Well-nourished in appearance. Alert, appropriate and cooperative throughout exam. Head: Normocephalic without obvious abnormalities;  pattern alopecia  Eyes: No corneal or conjunctival inflammation noted. Extraocular motion intact. Vision grossly decreased OS even  with lenses Ears: External  ear exam reveals no significant lesions or deformities. Canals clear but narrowed by osteomata .TMs normal. Hearing is grossly normal bilaterally. Nose: External nasal exam reveals no deformity or inflammation. Nasal mucosa are pink and moist. No lesions or exudates noted. Septum deviated to R Mouth: Oral mucosa and oropharynx reveal no lesions or exudates. Teeth in good repair. Neck: No deformities, masses, or tenderness noted. Range of motion &Thyroid  normal. Lungs: Normal respiratory effort; chest expands symmetrically. Lungs are clear to auscultation without rales, wheezes, or increased work of breathing. Heart: Normal rate and rhythm. Normal S1 and S2. No gallop, click, or rub. S4 with slurring at  LSB. Abdomen: Bowel sounds normal; abdomen soft and nontender. No masses, organomegaly or hernias noted. Genitalia: Dr Laverle Patter                              Musculoskeletal/extremities: There is some asymmetry of the posterior thoracic musculature suggesting occult scoliosis. No clubbing, cyanosis, edema, or significant extremity  deformity noted. Range of motion normal .Tone & strength  Normal.Joints normal. Nail health good. Able to lie down & sit up w/o help. Negative SLR bilaterally but decreased ROM Vascular: Carotid, radial artery, dorsalis pedis and  posterior  tibial pulses are full and equal. No bruits present. Neurologic: Alert and oriented x3. Deep tendon reflexes symmetrical and normal.     Skin: Intact without suspicious lesions or rashes. Multiple lipomata Lymph: No cervical, axillary lymphadenopathy present. Psych: Mood and affect are normal. Normally interactive                                                                                        Assessment & Plan:  #1 comprehensive physical exam; no acute findings  Plan: see Orders  & Recommendations

## 2012-08-13 NOTE — Patient Instructions (Addendum)
Preventive Health Care: Exercise at least 30-45 minutes a day,  3-4 days a week.  Eat a low-fat diet with lots of fruits and vegetables, up to 7-9 servings Pinchos day. This would eliminate the need for vitamin supplements. Consume less than 40 grams of sugar (preferably ZERO) Kayshaun day from foods & drinks with High Fructose Corn Sugar as #1,2,3 or # 4 on label. Please  schedule fasting Labs : BMET,Lipids, hepatic panel, CBC & dif, TSH.PLEASE BRING THESE INSTRUCTIONS TO FOLLOW UP  LAB APPOINTMENT.This will guarantee correct labs are drawn, eliminating need for repeat blood sampling ( needle sticks ! ). Diagnoses /Codes: V70.0  If you activate the  My Chart system; lab & Xray results will be released directly  to you as soon as I review & address these through the computer. If you choose not to sign up for My Chart within 36 hours of labs being drawn; results will be reviewed & interpretation added before being copied & mailed, causing a delay in getting the results to you.If you do not receive that report within 7-10 days ,please call. Additionally you can use this system to gain direct  access to your records  if  out of town or @ an office of a  physician who is not in  the My Chart network.  This improves continuity of care & places you in control of your medical record.

## 2012-08-16 ENCOUNTER — Other Ambulatory Visit: Payer: Self-pay | Admitting: Internal Medicine

## 2012-08-16 ENCOUNTER — Encounter: Payer: Self-pay | Admitting: Internal Medicine

## 2012-08-16 DIAGNOSIS — Z0000 Encounter for general adult medical examination without abnormal findings: Secondary | ICD-10-CM

## 2012-08-17 ENCOUNTER — Other Ambulatory Visit (INDEPENDENT_AMBULATORY_CARE_PROVIDER_SITE_OTHER): Payer: BC Managed Care – PPO

## 2012-08-17 DIAGNOSIS — Z0000 Encounter for general adult medical examination without abnormal findings: Secondary | ICD-10-CM

## 2012-08-17 LAB — CBC WITH DIFFERENTIAL/PLATELET
Basophils Absolute: 0 10*3/uL (ref 0.0–0.1)
Hemoglobin: 14.1 g/dL (ref 13.0–17.0)
Lymphocytes Relative: 50.2 % — ABNORMAL HIGH (ref 12.0–46.0)
Monocytes Relative: 8.7 % (ref 3.0–12.0)
Neutro Abs: 1.9 10*3/uL (ref 1.4–7.7)
Neutrophils Relative %: 39.4 % — ABNORMAL LOW (ref 43.0–77.0)
RBC: 4.69 Mil/uL (ref 4.22–5.81)
RDW: 12.9 % (ref 11.5–14.6)

## 2012-08-17 LAB — HEPATIC FUNCTION PANEL
ALT: 23 U/L (ref 0–53)
Albumin: 4.2 g/dL (ref 3.5–5.2)
Total Protein: 7.1 g/dL (ref 6.0–8.3)

## 2012-08-17 LAB — LIPID PANEL
Cholesterol: 171 mg/dL (ref 0–200)
HDL: 53.1 mg/dL (ref >39.00)
Triglycerides: 74 mg/dL (ref 0.0–149.0)

## 2012-08-17 LAB — BASIC METABOLIC PANEL
CO2: 25 mEq/L (ref 19–32)
Calcium: 8.9 mg/dL (ref 8.4–10.5)
Creatinine, Ser: 0.8 mg/dL (ref 0.4–1.5)
Glucose, Bld: 103 mg/dL — ABNORMAL HIGH (ref 70–99)

## 2012-08-19 ENCOUNTER — Ambulatory Visit: Payer: BC Managed Care – PPO

## 2012-08-19 DIAGNOSIS — R7309 Other abnormal glucose: Secondary | ICD-10-CM

## 2012-08-19 LAB — HEMOGLOBIN A1C: Hgb A1c MFr Bld: 5.6 % (ref 4.6–6.5)

## 2012-10-27 ENCOUNTER — Other Ambulatory Visit: Payer: Self-pay | Admitting: Internal Medicine

## 2012-12-17 HISTORY — PX: OTHER SURGICAL HISTORY: SHX169

## 2012-12-23 ENCOUNTER — Other Ambulatory Visit: Payer: Self-pay | Admitting: Dermatology

## 2013-02-24 ENCOUNTER — Encounter: Payer: Self-pay | Admitting: Internal Medicine

## 2013-02-27 ENCOUNTER — Encounter: Payer: Self-pay | Admitting: Internal Medicine

## 2013-03-18 ENCOUNTER — Ambulatory Visit: Payer: BC Managed Care – PPO | Admitting: Internal Medicine

## 2013-03-21 ENCOUNTER — Encounter: Payer: Self-pay | Admitting: Internal Medicine

## 2013-03-21 ENCOUNTER — Ambulatory Visit (HOSPITAL_BASED_OUTPATIENT_CLINIC_OR_DEPARTMENT_OTHER)
Admission: RE | Admit: 2013-03-21 | Discharge: 2013-03-21 | Disposition: A | Discharge status: Home / Self Care | Payer: BC Managed Care – PPO | Source: Ambulatory Visit | Attending: Internal Medicine | Admitting: Internal Medicine

## 2013-03-21 ENCOUNTER — Other Ambulatory Visit: Payer: Self-pay | Admitting: Internal Medicine

## 2013-03-21 ENCOUNTER — Ambulatory Visit (INDEPENDENT_AMBULATORY_CARE_PROVIDER_SITE_OTHER): Payer: BC Managed Care – PPO | Admitting: Internal Medicine

## 2013-03-21 VITALS — BP 120/79 | HR 64 | Temp 98.4°F

## 2013-03-21 DIAGNOSIS — I809 Phlebitis and thrombophlebitis of unspecified site: Secondary | ICD-10-CM

## 2013-03-21 MED ORDER — AMOXICILLIN 500 MG PO CAPS: 500.0000 mg | ORAL_CAPSULE | Freq: Three times a day (TID) | ORAL | Status: DC

## 2013-03-21 MED ORDER — RIVAROXABAN 15 MG PO TABS: 15.0000 mg | ORAL_TABLET | Freq: Two times a day (BID) | ORAL | Status: DC

## 2013-03-21 NOTE — Progress Notes (Signed)
  Subjective:    Patient ID: [name] [name], male    DOB: 1951-01-29, 62 y.o.   MRN: [id number]  HPI  Symptoms began approximately 2 weeks ago as pain at the anterior left ankle. This progressed to involve the left medial calf up to the knee area. This was associated with constant pain up to a level VII which was up to level IX with palpation. There was no injury or trigger other than recent air travel to [place].  There has been associated rash and itching. He also noted a lump in the upper medial calf.  Aspercreme appears to be improving the symptoms. Ambulation improves it. It is worse in am after arising or after sitting for.a period  There is no personal or family history of deep venous thrombosis or pulmonary thromboemboli    Review of Systems  He denies fever, chills, or sweats  There's been no chest pain,pleurisy, palpitations, dyspnea, paroxysmal nocturnal dyspnea, or hemoptysis.     Objective:   Physical Exam Appears healthy and well-nourished & in no acute distress  No carotid bruits are present.No neck pain distention present at 10 - 15 degrees. Thyroid normal to palpation  Heart rhythm and rate are normal with no significant murmurs or gallops.  Chest is clear with no increased work of breathing  There is no evidence of aortic aneurysm or renal artery bruits  Abdomen soft with no organomegaly or masses. No HJR  No clubbing, cyanosis or edema present.  Pedal pulses are intact   Homans sign negative bilaterally. Left calf 37.5 cm in diameter and right calf 36 cm diameter 8 cm below the inferior patellar margin.  Erythema at the superior aspect of the left medial calf with palpable superficial thrombus.  No ischemic skin changes are present . Nails healthy  Alert and oriented. Strength, tone normal          Assessment & Plan:  #1 superficial thrombophlebitis in the context of transcontinental air travel. Rule out deep venous thrombosis.  See orders

## 2013-03-21 NOTE — Patient Instructions (Signed)
Your next office appointment will be determined based upon review of your pending Venous Doppler. Those instructions will be transmitted to you through My Chart  . Please report any significant change in your symptoms. Use warm moist compresses to 3 times a day to the affected area.

## 2013-03-24 ENCOUNTER — Other Ambulatory Visit: Payer: Self-pay

## 2013-03-28 ENCOUNTER — Ambulatory Visit (INDEPENDENT_AMBULATORY_CARE_PROVIDER_SITE_OTHER): Payer: BC Managed Care – PPO | Admitting: Internal Medicine

## 2013-03-28 ENCOUNTER — Encounter: Payer: Self-pay | Admitting: Internal Medicine

## 2013-03-28 VITALS — BP 145/87 | HR 60 | Temp 98.4°F | Resp 12 | Wt 239.4 lb

## 2013-03-28 DIAGNOSIS — I82812 Embolism and thrombosis of superficial veins of left lower extremities: Secondary | ICD-10-CM

## 2013-03-28 DIAGNOSIS — R972 Elevated prostate specific antigen [PSA]: Secondary | ICD-10-CM

## 2013-03-28 DIAGNOSIS — I82819 Embolism and thrombosis of superficial veins of unspecified lower extremities: Secondary | ICD-10-CM | POA: Insufficient documentation

## 2013-03-28 NOTE — Assessment & Plan Note (Signed)
03/28/13 no associated phlebitis suggested clinically.  Literature was reviewed in Up to Date; absence of risk of deep venous propagation mitigates against continued anticoagulation. Anti-inflammatory agents topically and by mouth will be continued along with compresses. He is to call should he have any warning symptoms as discussed.

## 2013-03-28 NOTE — Progress Notes (Signed)
Pre visit review using our clinic review tool, if applicable. No additional management support is needed unless otherwise documented below in the visit note. 

## 2013-03-28 NOTE — Progress Notes (Signed)
  Subjective:    Patient ID: [name] [name], male    DOB: [birth year], 62 y.o.   MRN: [id number]  HPI  He has noted improvement in the tenderness and pain as well as the extent of the clot. Previously it had symptoms mainly above the left medial malleolus; the symptoms have resolved. He completed the seven-day course of Xarelto last night.  The Doppler study and literature were reviewed. The deep 6 saphenous system is patent. The major risk is that the length of the thrombosed superficial veins is 10 cm.    Review of Systems  He denies any chest pain or dyspnea. There is no localized erythema and he's had no fever, chills, or sweats.     Objective:   Physical Exam  He appears healthy and well-nourished in no distress  Pedal pulses are intact. He has no edema.  There is an indurated thrombosed superficial calf vein over the left medial superior calf extending 10 cm in length and 3 cm in width. There is no associated tenderness or erythema.  Homans sign is negative.        Assessment & Plan:  See Current Assessment & Plan in Problem List under specific Diagnosis

## 2013-03-28 NOTE — Patient Instructions (Signed)
Use an anti-inflammatory cream such as Aspercreme or Zostrix cream twice a day to the affected area as needed. In lieu of this warm moist compresses or  hot water bottle can be used. Do not apply ice .Take Aleve one to 2 every 8-12 hours with food as needed. Please report warning signs as we discussed. Worrisome would be change in color or size of leg, increased pain, fever, or any chest symptoms.

## 2013-07-20 ENCOUNTER — Other Ambulatory Visit: Payer: Self-pay | Admitting: *Deleted

## 2013-07-20 MED ORDER — BENAZEPRIL HCL 20 MG PO TABS: ORAL_TABLET | ORAL | Status: DC

## 2013-07-20 NOTE — Telephone Encounter (Signed)
Rx sent to the pharmacy by e-script.//AB/CMA 

## 2013-08-30 ENCOUNTER — Encounter: Payer: BC Managed Care – PPO | Admitting: Internal Medicine

## 2013-09-01 ENCOUNTER — Ambulatory Visit (INDEPENDENT_AMBULATORY_CARE_PROVIDER_SITE_OTHER): Payer: Medicare Other | Admitting: Internal Medicine

## 2013-09-01 ENCOUNTER — Other Ambulatory Visit (INDEPENDENT_AMBULATORY_CARE_PROVIDER_SITE_OTHER): Payer: Medicare Other

## 2013-09-01 ENCOUNTER — Encounter: Payer: Self-pay | Admitting: Internal Medicine

## 2013-09-01 VITALS — BP 128/90 | HR 65 | Temp 98.3°F | Resp 13 | Ht 76.0 in | Wt 238.4 lb

## 2013-09-01 DIAGNOSIS — R7309 Other abnormal glucose: Secondary | ICD-10-CM

## 2013-09-01 DIAGNOSIS — IMO0001 Reserved for inherently not codable concepts without codable children: Secondary | ICD-10-CM

## 2013-09-01 DIAGNOSIS — Z860100 Personal history of colon polyps, unspecified: Secondary | ICD-10-CM

## 2013-09-01 DIAGNOSIS — E782 Mixed hyperlipidemia: Secondary | ICD-10-CM

## 2013-09-01 DIAGNOSIS — L570 Actinic keratosis: Secondary | ICD-10-CM | POA: Insufficient documentation

## 2013-09-01 DIAGNOSIS — L821 Other seborrheic keratosis: Secondary | ICD-10-CM | POA: Insufficient documentation

## 2013-09-01 DIAGNOSIS — I10 Essential (primary) hypertension: Secondary | ICD-10-CM

## 2013-09-01 LAB — BASIC METABOLIC PANEL
BUN: 15 mg/dL (ref 6–23)
CALCIUM: 9.5 mg/dL (ref 8.4–10.5)
CO2: 29 mEq/L (ref 19–32)
Chloride: 101 mEq/L (ref 96–112)
Creatinine, Ser: 0.8 mg/dL (ref 0.4–1.5)
GFR: 103.12 mL/min (ref >60.00)
Glucose, Bld: 108 mg/dL — ABNORMAL HIGH (ref 70–99)
Potassium: 5.3 mEq/L — ABNORMAL HIGH (ref 3.5–5.1)
SODIUM: 137 meq/L (ref 135–145)

## 2013-09-01 LAB — CBC WITH DIFFERENTIAL/PLATELET
BASOS PCT: 0.4 % (ref 0.0–3.0)
Basophils Absolute: 0 10*3/uL (ref 0.0–0.1)
EOS PCT: 0.4 % (ref 0.0–5.0)
Eosinophils Absolute: 0 10*3/uL (ref 0.0–0.7)
HCT: 44.2 % (ref 39.0–52.0)
Hemoglobin: 15.1 g/dL (ref 13.0–17.0)
LYMPHS PCT: 35.9 % (ref 12.0–46.0)
Lymphs Abs: 2 10*3/uL (ref 0.7–4.0)
MCHC: 34.3 g/dL (ref 30.0–36.0)
MCV: 89.2 fl (ref 78.0–100.0)
MONOS PCT: 7.4 % (ref 3.0–12.0)
Monocytes Absolute: 0.4 10*3/uL (ref 0.1–1.0)
NEUTROS PCT: 55.9 % (ref 43.0–77.0)
Neutro Abs: 3 10*3/uL (ref 1.4–7.7)
Platelets: 243 10*3/uL (ref 150.0–400.0)
RBC: 4.95 Mil/uL (ref 4.22–5.81)
RDW: 13.2 % (ref 11.5–14.6)
WBC: 5.4 10*3/uL (ref 4.5–10.5)

## 2013-09-01 LAB — LIPID PANEL
CHOLESTEROL: 181 mg/dL (ref 0–200)
HDL: 72.4 mg/dL (ref >39.00)
LDL CALC: 104 mg/dL — AB (ref 0–99)
TRIGLYCERIDES: 25 mg/dL (ref 0.0–149.0)
Total CHOL/HDL Ratio: 3
VLDL: 5 mg/dL (ref 0.0–40.0)

## 2013-09-01 LAB — HEPATIC FUNCTION PANEL
ALBUMIN: 4.3 g/dL (ref 3.5–5.2)
ALK PHOS: 61 U/L (ref 39–117)
ALT: 29 U/L (ref 0–53)
AST: 25 U/L (ref 0–37)
Bilirubin, Direct: 0.1 mg/dL (ref 0.0–0.3)
TOTAL PROTEIN: 7.3 g/dL (ref 6.0–8.3)
Total Bilirubin: 0.6 mg/dL (ref 0.3–1.2)

## 2013-09-01 LAB — MAGNESIUM: Magnesium: 1.9 mg/dL (ref 1.5–2.5)

## 2013-09-01 LAB — HEMOGLOBIN A1C: Hgb A1c MFr Bld: 5.4 % (ref 4.6–6.5)

## 2013-09-01 LAB — TSH: TSH: 1.36 u[IU]/mL (ref 0.35–5.50)

## 2013-09-01 NOTE — Progress Notes (Signed)
Subjective:    Patient ID: [name] [name], male    DOB: 16-Dec-1950, 63 y.o.   MRN: [id number]  HPI He is here to assess active health issues & conditions. PMH, FH, & Social history verified & updated .  Blood pressure range / average :not monitored X 6 mos Compliant with anti hypertemsive medication. No lightheadedness ; occasional dry cough.  A heart healthy /low salt diet not followed. Exercise encompasses 60  minutes 3-4  times Shaun week as  gym or golf without symptoms.  To date no statin. Low dose ASA taken    Review of Systems Significant headaches, epistaxis, chest pain, palpitations, exertional dyspnea, claudication, paroxysmal nocturnal dyspnea, or edema absent.  Significant abdominal symptoms, memory deficit, or myalgias not present. He has shin pain for which he takes magnesium daily  Minor tinnitus bilaterally.     Objective:   Physical Exam Gen.: Healthy and well-nourished in appearance. Alert, appropriate and cooperative throughout exam. Head: Normocephalic without obvious abnormalities; pattern alopecia  Eyes: No corneal or conjunctival inflammation noted. Pupils equal round reactive to light and accommodation. Extraocular motion intact.  Ears: External  ear exam reveals no significant lesions or deformities. Some wax ;TMs partially visualized. Hearing is grossly normal bilaterally. Nose: External nasal exam reveals no deformity or inflammation. Nasal mucosa are pink and moist. No lesions or exudates noted.   Mouth: Oral mucosa and oropharynx reveal no lesions or exudates. Teeth in good repair. Neck: No deformities, masses, or tenderness noted. Range of motion decreased. Thyroid normal. Lungs: Normal respiratory effort; chest expands symmetrically. Lungs are clear to auscultation without rales, wheezes, or increased work of breathing. Heart: Rate slow and rhythm regular. Normal S1 and S2. No gallop, click, or rub. No murmur. Abdomen: Bowel sounds normal; abdomen soft  and nontender. No masses, organomegaly or hernias noted. Small epigastric lipomata Genitalia: as Emanuell Dr Alinda Money                                  Musculoskeletal/extremities: No deformity or scoliosis noted of  the thoracic or lumbar spine.  No clubbing, cyanosis, edema, or significant extremity  deformity noted. Range of motion normal .Tone & strength normal. Hand joints normal   Fingernail  health good. L great toenail deformed Able to lie down & sit up w/o help. Negative SLR bilaterally Vascular: Carotid, radial artery, dorsalis pedis and  posterior tibial pulses are full and equal. No bruits present. Neurologic: Alert and oriented x3. Deep tendon reflexes symmetrical and normal.  Gait normal  . Skin: Intact without suspicious lesions or rashes. Large lipoma upper back Lymph: No cervical, axillary, or inguinal lymphadenopathy present. Psych: Mood and affect are normal. Normally interactive                                                                                        Assessment & Plan:  See Current Assessment & Plan in Problem List under specific Diagnosis The labs will be reviewed and risks and options assessed. Written recommendations will be provided by mail or directly through My Chart.Further evaluation or  change in medical therapy will be directed by those results.

## 2013-09-01 NOTE — Assessment & Plan Note (Signed)
CBC & dif 

## 2013-09-01 NOTE — Progress Notes (Signed)
Pre visit review using our clinic review tool, if applicable. No additional management support is needed unless otherwise documented below in the visit note. 

## 2013-09-01 NOTE — Assessment & Plan Note (Signed)
Check Mg++ level as he is on maintenance supplement

## 2013-09-01 NOTE — Assessment & Plan Note (Signed)
Monitor annually by Dr Alinda Money

## 2013-09-01 NOTE — Assessment & Plan Note (Signed)
Blood pressure goals reviewed. BMET 

## 2013-09-01 NOTE — Patient Instructions (Signed)

## 2013-09-01 NOTE — Assessment & Plan Note (Signed)
Lipids, LFTs,CK 

## 2013-10-24 ENCOUNTER — Other Ambulatory Visit: Payer: Self-pay | Admitting: Internal Medicine

## 2013-12-19 ENCOUNTER — Other Ambulatory Visit: Payer: Self-pay | Admitting: Dermatology

## 2014-02-14 ENCOUNTER — Encounter: Payer: Self-pay | Admitting: Internal Medicine

## 2014-03-14 ENCOUNTER — Encounter: Payer: Self-pay | Admitting: Internal Medicine

## 2014-03-14 ENCOUNTER — Ambulatory Visit (INDEPENDENT_AMBULATORY_CARE_PROVIDER_SITE_OTHER): Payer: Medicare Other | Admitting: Internal Medicine

## 2014-03-14 VITALS — BP 128/78 | HR 69 | Temp 98.2°F | Resp 13 | Wt 241.5 lb

## 2014-03-14 DIAGNOSIS — M5459 Other low back pain: Secondary | ICD-10-CM

## 2014-03-14 DIAGNOSIS — M545 Low back pain: Secondary | ICD-10-CM

## 2014-03-14 DIAGNOSIS — J310 Chronic rhinitis: Secondary | ICD-10-CM

## 2014-03-14 NOTE — Progress Notes (Signed)
Pre visit review using our clinic review tool, if applicable. No additional management support is needed unless otherwise documented below in the visit note. 

## 2014-03-14 NOTE — Patient Instructions (Signed)
Plain Mucinex (NOT D) for thick secretions ;force NON dairy fluids .   Nasal cleansing in the shower as discussed with lather of mild shampoo.After 10 seconds wash off lather while  exhaling through nostrils. Make sure that all residual soap is removed to prevent irritation.  Flonase OR Nasacort AQ 1 spray in each nostril twice a day as needed. Use the "crossover" technique into opposite nostril spraying toward opposite ear @ 45 degree angle, not straight up into nostril.  Use a Neti pot daily only  as needed for significant sinus congestion; going from open side to congested side . Plain Allegra (NOT D )  160 daily , Loratidine 10 mg , OR Zyrtec 10 mg @ bedtime  as needed for itchy eyes & sneezing. The best exercises for the low back include freestyle swimming, stretch aerobics, and yoga.Cybex & Nautilus machines rather than dead weights are better for the back.

## 2014-03-14 NOTE — Progress Notes (Signed)
Subjective:    Patient ID: [name] [name], male    DOB: December 05, 1950, 63 y.o.   MRN: [id number]  HPI  He describes a "tiredness" for at least a month in the low back. It is present 24/7. There's been no definite treatment. It is in the lumbosacral area without radiation. It's described as dull. It is worse with activity. He is concerned as he will be retiring & wants to increase golf participation.  He has no worrisome neuromuscular or constitutional symptoms. He has no GI or genitourinary symptoms. Additionally for at least 10 years he has recurrent right facial sinus pressure associated with postnasal drainage. In the morning the right nostril is occluded. He's had a recent exacerbation which he feels is related to dental work. Dental films apparently were negative. The postnasal drainage is associated with cough productive of clear material He has no signs or symptoms of extrinsic process or upper respiratory tract infection.    Review of Systems  He denies numbness, tingling, or weakness in his upper or lower extremities. He has no gait or balance issues. There is no incontinence of urine or stool. Unexplained weight loss, abdominal pain, significant dyspepsia, dysphagia, melena, rectal bleeding, or persistently small caliber stools are denied. Dysuria, pyuria,or hematuria are denied. Frontal headache, facial pain , nasal purulence, dental pain, sore throat , otic pain or otic discharge denied. No fever , chills or sweats.Extrinsic symptoms of itchy, watery eyes, sneezing, or angioedema are denied. There is no significant cough, sputum production, wheezing,or  paroxysmal nocturnal dyspnea.     Objective:   Physical Exam Gen.: Healthy and well-nourished in appearance. Alert, appropriate and cooperative throughout exam. Appears younger than stated age  Head: Normocephalic without obvious abnormalities;pattern alopecia  Eyes: No corneal or conjunctival inflammation noted.  Ears:  External  ear exam reveals no significant lesions or deformities. Canals clear .TMs normal. Hearing is grossly normal bilaterally. Nose: External nasal exam reveals no deformity or inflammation. Nasal mucosa are pink and moist. No lesions or exudates noted.  Septal deviation to R & dislocation to L. Mouth: Oral mucosa and oropharynx reveal no lesions or exudates. Teeth in good repair. Neck: No deformities, masses, or tenderness noted. Range of motion normal. Lungs: Normal respiratory effort; chest expands symmetrically. Lungs are clear to auscultation without rales, wheezes, or increased work of breathing. Heart: Normal rate and rhythm. Normal S1 and S2. No gallop, click, or rub. Grade 1/2 systolic murmur. Abdomen: Bowel sounds normal; abdomen soft and nontender. No masses, organomegaly or hernias noted. Musculoskeletal/extremities: No deformity or scoliosis noted of  the thoracic or lumbar spine.  No clubbing, cyanosis, edema, or significant extremity  deformity noted. Range of motion normal .Tone & strength normal. Hand joints normal Fingernail health good. Minor crepitus knees. Able to lie down & sit up w/o help. Negative SLR bilaterally Vascular: Carotid, radial artery, dorsalis pedis and  posterior tibial pulses are full and equal. No bruits present. Neurologic: Alert and oriented x3. Deep tendon reflexes symmetrical and normal.  Gait normal  including heel & toe walking .  Skin: Intact without suspicious lesions or rashes. Lymph: No cervical, axillary lymphadenopathy present. Psych: Mood and affect are normal. Normally interactive  Assessment & Plan:  #1non allergic, perennial rhinitis #2 mechanical LBP See orders & AVS

## 2014-08-24 ENCOUNTER — Encounter: Payer: Medicare Other | Admitting: Internal Medicine

## 2014-09-05 ENCOUNTER — Ambulatory Visit (INDEPENDENT_AMBULATORY_CARE_PROVIDER_SITE_OTHER): Payer: Medicare HMO | Admitting: Internal Medicine

## 2014-09-05 ENCOUNTER — Other Ambulatory Visit (INDEPENDENT_AMBULATORY_CARE_PROVIDER_SITE_OTHER): Payer: Medicare HMO

## 2014-09-05 ENCOUNTER — Encounter: Payer: Self-pay | Admitting: Internal Medicine

## 2014-09-05 VITALS — BP 128/86 | HR 71 | Temp 98.4°F | Resp 14 | Ht 77.0 in | Wt 221.8 lb

## 2014-09-05 DIAGNOSIS — IMO0001 Reserved for inherently not codable concepts without codable children: Secondary | ICD-10-CM

## 2014-09-05 DIAGNOSIS — M791 Myalgia: Secondary | ICD-10-CM | POA: Diagnosis not present

## 2014-09-05 DIAGNOSIS — M609 Myositis, unspecified: Secondary | ICD-10-CM

## 2014-09-05 DIAGNOSIS — I10 Essential (primary) hypertension: Secondary | ICD-10-CM | POA: Diagnosis not present

## 2014-09-05 DIAGNOSIS — Z86010 Personal history of colonic polyps: Secondary | ICD-10-CM

## 2014-09-05 DIAGNOSIS — R972 Elevated prostate specific antigen [PSA]: Secondary | ICD-10-CM | POA: Diagnosis not present

## 2014-09-05 DIAGNOSIS — R739 Hyperglycemia, unspecified: Secondary | ICD-10-CM

## 2014-09-05 DIAGNOSIS — E782 Mixed hyperlipidemia: Secondary | ICD-10-CM | POA: Diagnosis not present

## 2014-09-05 LAB — HEMOGLOBIN A1C: Hgb A1c MFr Bld: 5.5 % (ref 4.6–6.5)

## 2014-09-05 LAB — CBC WITH DIFFERENTIAL/PLATELET
Basophils Absolute: 0 10*3/uL (ref 0.0–0.1)
Basophils Relative: 0.6 % (ref 0.0–3.0)
EOS ABS: 0 10*3/uL (ref 0.0–0.7)
Eosinophils Relative: 0.4 % (ref 0.0–5.0)
HCT: 44.3 % (ref 39.0–52.0)
Hemoglobin: 15.3 g/dL (ref 13.0–17.0)
LYMPHS PCT: 33.3 % (ref 12.0–46.0)
Lymphs Abs: 1.8 10*3/uL (ref 0.7–4.0)
MCHC: 34.6 g/dL (ref 30.0–36.0)
MCV: 87.9 fl (ref 78.0–100.0)
MONO ABS: 0.4 10*3/uL (ref 0.1–1.0)
MONOS PCT: 8.5 % (ref 3.0–12.0)
NEUTROS PCT: 57.2 % (ref 43.0–77.0)
Neutro Abs: 3 10*3/uL (ref 1.4–7.7)
PLATELETS: 255 10*3/uL (ref 150.0–400.0)
RBC: 5.03 Mil/uL (ref 4.22–5.81)
RDW: 13.6 % (ref 11.5–15.5)
WBC: 5.3 10*3/uL (ref 4.0–10.5)

## 2014-09-05 LAB — LIPID PANEL
CHOLESTEROL: 170 mg/dL (ref 0–200)
HDL: 77.1 mg/dL (ref >39.00)
LDL Cholesterol: 82 mg/dL (ref 0–99)
NONHDL: 92.9
Total CHOL/HDL Ratio: 2
Triglycerides: 54 mg/dL (ref 0.0–149.0)
VLDL: 10.8 mg/dL (ref 0.0–40.0)

## 2014-09-05 LAB — BASIC METABOLIC PANEL
BUN: 17 mg/dL (ref 6–23)
CO2: 29 mEq/L (ref 19–32)
CREATININE: 0.91 mg/dL (ref 0.40–1.50)
Calcium: 9.8 mg/dL (ref 8.4–10.5)
Chloride: 103 mEq/L (ref 96–112)
GFR: 88.6 mL/min (ref >60.00)
GLUCOSE: 104 mg/dL — AB (ref 70–99)
POTASSIUM: 5.1 meq/L (ref 3.5–5.1)
Sodium: 137 mEq/L (ref 135–145)

## 2014-09-05 LAB — HEPATIC FUNCTION PANEL
ALBUMIN: 4.5 g/dL (ref 3.5–5.2)
ALT: 25 U/L (ref 0–53)
AST: 26 U/L (ref 0–37)
Alkaline Phosphatase: 76 U/L (ref 39–117)
Bilirubin, Direct: 0.2 mg/dL (ref 0.0–0.3)
Total Bilirubin: 0.8 mg/dL (ref 0.2–1.2)
Total Protein: 7.3 g/dL (ref 6.0–8.3)

## 2014-09-05 LAB — TSH: TSH: 1.69 u[IU]/mL (ref 0.35–4.50)

## 2014-09-05 LAB — MAGNESIUM: MAGNESIUM: 2.1 mg/dL (ref 1.5–2.5)

## 2014-09-05 LAB — VITAMIN D 25 HYDROXY (VIT D DEFICIENCY, FRACTURES): VITD: 29.85 ng/mL — AB (ref 30.00–100.00)

## 2014-09-05 NOTE — Assessment & Plan Note (Addendum)
CBC Colonoscopy due 2022 as Dov Dr Deatra Ina

## 2014-09-05 NOTE — Progress Notes (Signed)
Subjective:    Patient ID: [name] [name], male    DOB: [birth year], 64 y.o.   MRN: [id number]  HPI The patient is here to assess status of active health conditions.  PMH, FH, & Social History reviewed & updated.  He has been compliant with his ACE inhibitor; he has an intermittent dry cough. BP averages 130/84 @ home. He is not restricting salt [name]. He is on a modified heart healthy diet. He's at the gym 3 times a week for at least 60 minutes; he also plays golf and does yardwork two times [name] week. He has no associated cardio pulmonary symptoms. The day after exercise he does have some knee discomfort. He has had myalgias for which she has taken magnesium with benefit. He is questioning his vitamin D status as playing role in the muscle discomfort.  He has had mild hyperglycemia without associated symptoms. Maternal grandfather may have had diabetes. His last A1c was 5.4%.  Colonoscopy was last performed 07/11/10 and was negative. He previously had hyperplastic polyps on 2 occasions. Dr. Deatra Ina apparently has told him that a 10 year follow-up is adequate. He has lost 20 pounds through therapeutic interventions. He has no active GI symptoms.  He has urologic surveillance every 6 months by Dr. Alinda Money. His PSA is stable at 6. He has no genitourinary symptoms except for slight hesitancy.  Review of Systems Chest pain, palpitations, tachycardia, exertional dyspnea, paroxysmal nocturnal dyspnea, claudication or edema are absent.  Unexplained weight loss, abdominal pain, significant dyspepsia, dysphagia, melena, rectal bleeding, or persistently small caliber stools are denied.  Dysuria, pyuria, hematuria, frequency, nocturia or polyuria are denied.  Tinnitus persistently bilaterally X 2 years; ENT referral declined.    Objective:   Physical Exam  Gen.: Adequately nourished in appearance. Alert, appropriate and cooperative throughout exam.  Appears younger than stated age  Head:  Normocephalic without obvious abnormalities;  pattern alopecia  Eyes: No corneal or conjunctival inflammation noted. Pupils equal round reactive to light and accommodation. Extraocular motion intact.  Ears: External  ear exam reveals no significant lesions or deformities. Canals clear .TMs normal. Hearing is grossly normal bilaterally. Nose: External nasal exam reveals no deformity or inflammation. Nasal mucosa are pink and moist. No lesions or exudates noted.Septum deviated to right. Mouth: Oral mucosa and oropharynx reveal no lesions or exudates. Teeth in good repair. Neck: No deformities, masses, or tenderness noted. Range of motion slightly decreased. Thyroid normal. Lungs: Normal respiratory effort; chest expands symmetrically. Lungs are clear to auscultation without rales, wheezes, or increased work of breathing. Heart: Normal rate and rhythm. Normal S1 and S2. No gallop, click, or rub. Grade 1/2 R base murmur. Abdomen: Bowel sounds normal; abdomen soft and nontender. No masses, organomegaly or hernias noted. Genitalia: as [name] Dr Alinda Money                              Musculoskeletal/extremities: No deformity or scoliosis noted of  the thoracic or lumbar spine. No clubbing, cyanosis, edema, or significant extremity  deformity noted.  Range of motion normal . Tone & strength normal. Hand joints normal  Fingernail  health good. Crepitus of knees  Able to lie down & sit up w/o help.  Negative SLR bilaterally Vascular: Carotid, radial artery, dorsalis pedis and  posterior tibial pulses are full and equal. No bruits present. Neurologic: Alert and oriented x3. Deep tendon reflexes symmetrical and normal.  Gait normal  Skin: Intact without suspicious lesions or rashes. Lymph: No cervical, axillary lymphadenopathy present. Psych: Mood and affect are normal. Normally interactive                                                                                      Assessment & Plan:  See  Current Assessment & Plan in Problem List under specific Diagnosis

## 2014-09-05 NOTE — Patient Instructions (Signed)
  Your next office appointment will be determined based upon review of your pending labs .  Those instructions will be transmitted to you by My Chart   Critical results will be called.   Followup as needed for any active or acute issue. Please report any significant change in your symptoms.  Minimal Blood Pressure Goal= AVERAGE < 140/90;  Ideal is an AVERAGE < 135/85. This AVERAGE should be calculated from @ least 5-7 BP readings taken @ different times of day on different days of week. You should not respond to isolated BP readings , but rather the AVERAGE for that week .Please bring your  blood pressure cuff to office visits to verify that it is reliable.It  can also be checked against the blood pressure device at the pharmacy.

## 2014-09-05 NOTE — Assessment & Plan Note (Signed)
6 month survelliance by Dr Alinda Money

## 2014-09-05 NOTE — Assessment & Plan Note (Signed)
Mg CK Vitamin D

## 2014-09-05 NOTE — Progress Notes (Signed)
Pre visit review using our clinic review tool, if applicable. No additional management support is needed unless otherwise documented below in the visit note. 

## 2014-09-05 NOTE — Assessment & Plan Note (Signed)
Lipids, LFTs, TSH  

## 2014-09-07 ENCOUNTER — Encounter: Payer: Self-pay | Admitting: Internal Medicine

## 2014-09-07 DIAGNOSIS — E559 Vitamin D deficiency, unspecified: Secondary | ICD-10-CM | POA: Insufficient documentation

## 2014-11-13 ENCOUNTER — Other Ambulatory Visit: Payer: Self-pay | Admitting: Internal Medicine

## 2015-02-02 ENCOUNTER — Other Ambulatory Visit: Payer: Self-pay | Admitting: Internal Medicine

## 2015-02-12 ENCOUNTER — Encounter: Payer: Self-pay | Admitting: Internal Medicine

## 2015-05-03 ENCOUNTER — Telehealth: Payer: Self-pay

## 2015-05-03 NOTE — Telephone Encounter (Signed)
Call regarding AWV; Agreed to come in 1/4 Wed at 11am with wife for AWV.

## 2015-05-23 ENCOUNTER — Ambulatory Visit (INDEPENDENT_AMBULATORY_CARE_PROVIDER_SITE_OTHER): Payer: Medicare HMO

## 2015-05-23 VITALS — BP 130/80 | Ht 77.0 in | Wt 213.5 lb

## 2015-05-23 DIAGNOSIS — Z0000 Encounter for general adult medical examination without abnormal findings: Secondary | ICD-10-CM

## 2015-05-23 NOTE — Progress Notes (Addendum)
Subjective:   [name] [name] is a 64 y.o. male who presents for an Initial Medicare Annual Wellness Visit.  Review of Systems  HRA assessment completed during visit;  The Patient was informed that this wellness visit is to identify risk and educate on how to reduce risk for increase disease through lifestyle changes.   ROS deferred and to schedule CPE for April 2016  Family Hx  Dad had heart attacks; no lifestyle issues  Mother and brother are fine x high BP   Just retired this year; Plans to travel and see children; going to [facility] Describe health great   Medical issues  Annual in April 2016; HTN;  hyperlipidemia; chol 170; Trig 54; HDL 77; LDL 82; ratio 2 hyperglycemia A1c 5.5  BMI: 25 Diet; eats breakfast; oatmeal and 2 boiled eggs Lunch fruit salad Supper; a lot of meat fish; shrimp; chicken; read meat x 2;  Salmon;  Practices Portion control  Does not eat deserts or sweets;   Exercise; going to the y; elliptical and rowing;  Work the different muscle groups;  60 min 3 days a week   SAFETY/ lives in more than one level; moved bedroom downstairs;  Safety reviewed for the home; has big shower; actually  roll in shower;  including removal of clutter; railing as needed; bathroom safety; community safety; smoke detectors and firearms safety no firearms Driving accidents (no ) and seatbelt yes Sun protection: does wear sun protection  See Dermatologist annually  Stressors; none at present   Medication review/ New meds  Fall assessment No  Gait assessment; no issues   Mobilization and Functional losses in the last year./ no functional changes  Sleep patterns; wakes up middle of night; sleeps on average 7 hours    Urinary or fecal incontinence reviewed;  PSA high norm and followed by urology   Counseling: Hep C screening to be done at next physical; to schedule today for April Vit d low; 29; takes Vit d 1000 u and will have this checked at annual PSA 5.95; followed  2012; baseline; [facility] Colonoscopy; 06/2010 due 06/2020 EKG: 07/2012 Hearing: 2000hz  both ears Ophthalmology exam; august eye exam;  retinal issue on left; doesn't see anything central in left eye; some issues playing golf but doesn't bother him Immunizations none due   Current Care Team reviewed and updated  Cardiac Risk Factors include: advanced age (>66men, >68 women);male gender   Objective:    Today's Vitals   05/23/15 1157  BP: 130/80  Height: 6\' 5"  (1.956 m)  Weight: 213 lb 8 oz (96.843 kg)    Current Medications (verified) Outpatient Encounter Prescriptions as of 05/23/2015  Medication Sig  . aspirin 81 MG tablet Take 81 mg by mouth daily.  . benazepril (LOTENSIN) 20 MG tablet TAKE ONE TABLET BY MOUTH ONCE DAILY  . Cholecalciferol (VITAMIN D3) 2000 units TABS Take by mouth.  . fish oil-omega-3 fatty acids 1000 MG capsule Take 2 g by mouth daily.   Marland Kitchen MAGNESIUM CITRATE PO Take 400 mg by mouth daily.   No facility-administered encounter medications on file as of 05/23/2015.    Allergies (verified) Review of patient's allergies indicates no known allergies.   History: Past Medical History  Diagnosis Date  . History of colonic polyps 2003, 2007    hyperplastic  . Hyperlipidemia   . Hypertension   . Benign prostatic hypertrophy with elevated PSA     Dr Alinda Money, High grade PIN w/o malignancy  . Retinal vascular occlusion  Dr Delman Cheadle  . Hx of colonoscopy 2012    neg   Past Surgical History  Procedure Laterality Date  . Appendectomy    . Colonoscopy w/ polypectomy       X2 , Dr Deatra Ina  . Hernia repair  [id number]     Dr Margot Chimes  . Prostate biopsy  09/2010    Dr Alinda Money  . Lipoma excision      X 5  . Lichenoid keratosis resected  8/14   Family History  Problem Relation Age of Onset  . Alzheimer's disease Mother   . Heart attack Father 13  . Hypertension Brother   . Hypertension Maternal Grandmother   . Diabetes Neg Hx   . Stroke Neg Hx    Social  History   Occupational History  . Not on file.   Social History Main Topics  . Smoking status: Not on file  . Smokeless tobacco: Not on file  . Alcohol Use: 12.6 oz/week    21 Glasses of wine Sabien week     Comment:  3 glasses of wine  . Drug Use: No  . Sexual Activity: Not on file   Tobacco Counseling Counseling given: Yes   Activities of Daily Living In your present state of health, do you have any difficulty performing the following activities: 05/23/2015 09/05/2014  Hearing? - N  Vision? N Y  Difficulty concentrating or making decisions? N N  Walking or climbing stairs? N Y  Dressing or bathing? N N  Doing errands, shopping? N N  Preparing Food and eating ? N -  Using the Toilet? N -  In the past six months, have you accidently leaked urine? N -  Do you have problems with loss of bowel control? N -  Managing your Medications? N -  Managing your Finances? N -  Housekeeping or managing your Housekeeping? N -    Immunizations and Health Maintenance Immunization History  Administered Date(s) Administered  . H1N1 05/15/2008  . Influenza Split 02/17/2011, 02/29/2012  . Influenza Whole 03/16/2007, 02/12/2009, 03/05/2010  . Influenza, High Dose Seasonal PF 02/01/2014  . Influenza-Unspecified 02/23/2013, 02/12/2015  . Pneumococcal Conjugate-13 02/01/2014  . Pneumococcal Polysaccharide-23 04/06/2015  . Td 02/22/2008  . Tdap 10/02/2011  . Zoster 06/01/2014   Health Maintenance Due  Topic Date Due  . Hepatitis C Screening  [birth year]   To complete at next physical and blood draw  Patient Care Team: Binnie Rail, MD as PCP - General (Internal Medicine)  Indicate any recent Medical Services you may have received from other than [facility] providers in the past year (date may be approximate).    Assessment:   This is a routine wellness examination for [name] [name].  Hearing/Vision screen  Hearing Screening   125Hz  250Hz  500Hz  1000Hz  2000Hz  4000Hz  8000Hz   Right ear:     100      Left ear:     100      Dietary issues and exercise activities discussed: Current Exercise Habits:: Structured exercise class, Time (Minutes): 60, Frequency (Times/Week): 3, Weekly Exercise (Minutes/Week): 180, Intensity: Moderate  Goals    . golf     To play more golf;       Depression Screen PHQ 2/9 Scores 05/23/2015 09/05/2014  PHQ - 2 Score 0 0    Fall Risk Fall Risk  05/23/2015 09/05/2014  Falls in the past year? No No    Cognitive Function: No flowsheet data found. AD8  Score 0; discussed memory and no concerns at  this time  Screening Tests Health Maintenance  Topic Date Due  . Hepatitis C Screening  Sep 29, 1950  . INFLUENZA VACCINE  12/18/2015  . COLONOSCOPY  07/11/2020  . TETANUS/TDAP  10/01/2021  . ZOSTAVAX  Completed  . PNA vac Low Risk Adult  Completed        Plan:    During the course of the visit Hai was educated and counseled about the following appropriate screening and preventive services:   Vaccines to include Pneumoccal, Influenza, Hepatitis B, Td, Zostavax, HCV/ none due  Electrocardiogram/ 07/2012  Colorectal cancer screening/ due 06/2020  Cardiovascular disease screening/ BP normal range today;  BMI normal  Diabetes screening/A1c 5.5  Glaucoma screening/ neg   Nutrition counseling/ diet is healthy; eats well; watches portions; lost 30 lbs last year by cutting back   Prostate cancer screening/ followed by urology   Smoking cessation counseling/ na/  Patient Instructions (the written plan) were given to the patient.   [id number], RN   05/23/2015     Medical screening examination/treatment/procedure(s) were performed by non-physician practitioner and as supervising provider I was immediately available for consultation/collaboration. I agree with above. Mauricio Po, FNP

## 2015-05-23 NOTE — Patient Instructions (Addendum)
[name] [name] , Thank you for taking time to come for your Medicare Wellness Visit. I appreciate your ongoing commitment to your health goals. Please review the following plan we discussed and let me know if I can assist you in the future.   Hep c will have drawn at next lab draw  These are the goals we discussed: Goals    . golf     To play more golf;        This is a list of the screening recommended for you and due dates:  Health Maintenance  Topic Date Due  .  Hepatitis C: One time screening is recommended by Center for Disease Control  (CDC) for  adults born from [birth year] through 1967.   Aug 19, 1950  . Flu Shot  12/18/2015  . Colon Cancer Screening  07/11/2020  . Tetanus Vaccine  10/01/2021  . Shingles Vaccine  Completed  . Pneumonia vaccines  Completed     Fall Prevention in the Home  Falls can cause injuries. They can happen to people of all ages. There are many things you can do to make your home safe and to help prevent falls.  WHAT CAN I DO ON THE OUTSIDE OF MY HOME?  Regularly fix the edges of walkways and driveways and fix any cracks.  Remove anything that might make you trip as you walk through a door, such as a raised step or threshold.  Trim any bushes or trees on the path to your home.  Use bright outdoor lighting.  Clear any walking paths of anything that might make someone trip, such as rocks or tools.  Regularly check to see if handrails are loose or broken. Make sure that both sides of any steps have handrails.  Any raised decks and porches should have guardrails on the edges.  Have any leaves, snow, or ice cleared regularly.  Use sand or salt on walking paths during winter.  Clean up any spills in your garage right away. This includes oil or grease spills. WHAT CAN I DO IN THE BATHROOM?   Use night lights.  Install grab bars by the toilet and in the tub and shower. Do not use towel bars as grab bars.  Use non-skid mats or decals in the tub or  shower.  If you need to sit down in the shower, use a plastic, non-slip stool.  Keep the floor dry. Clean up any water that spills on the floor as soon as it happens.  Remove soap buildup in the tub or shower regularly.  Attach bath mats securely with double-sided non-slip rug tape.  Do not have throw rugs and other things on the floor that can make you trip. WHAT CAN I DO IN THE BEDROOM?  Use night lights.  Make sure that you have a light by your bed that is easy to reach.  Do not use any sheets or blankets that are too big for your bed. They should not hang down onto the floor.  Have a firm chair that has side arms. You can use this for support while you get dressed.  Do not have throw rugs and other things on the floor that can make you trip. WHAT CAN I DO IN THE KITCHEN?  Clean up any spills right away.  Avoid walking on wet floors.  Keep items that you use a lot in easy-to-reach places.  If you need to reach something above you, use a strong step stool that has a grab bar.  Keep electrical cords out of the way.  Do not use floor polish or wax that makes floors slippery. If you must use wax, use non-skid floor wax.  Do not have throw rugs and other things on the floor that can make you trip. WHAT CAN I DO WITH MY STAIRS?  Do not leave any items on the stairs.  Make sure that there are handrails on both sides of the stairs and use them. Fix handrails that are broken or loose. Make sure that handrails are as long as the stairways.  Check any carpeting to make sure that it is firmly attached to the stairs. Fix any carpet that is loose or worn.  Avoid having throw rugs at the top or bottom of the stairs. If you do have throw rugs, attach them to the floor with carpet tape.  Make sure that you have a light switch at the top of the stairs and the bottom of the stairs. If you do not have them, ask someone to add them for you. WHAT ELSE CAN I DO TO HELP PREVENT  FALLS?  Wear shoes that:  Do not have high heels.  Have rubber bottoms.  Are comfortable and fit you well.  Are closed at the toe. Do not wear sandals.  If you use a stepladder:  Make sure that it is fully opened. Do not climb a closed stepladder.  Make sure that both sides of the stepladder are locked into place.  Ask someone to hold it for you, if possible.  Clearly mark and make sure that you can see:  Any grab bars or handrails.  First and last steps.  Where the edge of each step is.  Use tools that help you move around (mobility aids) if they are needed. These include:  Canes.  Walkers.  Scooters.  Crutches.  Turn on the lights when you go into a dark area. Replace any light bulbs as soon as they burn out.  Set up your furniture so you have a clear path. Avoid moving your furniture around.  If any of your floors are uneven, fix them.  If there are any pets around you, be aware of where they are.  Review your medicines with your doctor. Some medicines can make you feel dizzy. This can increase your chance of falling. Ask your doctor what other things that you can do to help prevent falls.   This information is not intended to replace advice given to you by your health care provider. Make sure you discuss any questions you have with your health care provider.   Document Released: 03/01/2009 Document Revised: 09/19/2014 Document Reviewed: 06/09/2014 Elsevier Interactive Patient Education 2016 [place] Maintenance, Male A healthy lifestyle and preventative care can promote health and wellness.  Maintain regular health, dental, and eye exams.  Eat a healthy diet. Foods like vegetables, fruits, whole grains, low-fat dairy products, and lean protein foods contain the nutrients you need and are low in calories. Decrease your intake of foods high in solid fats, added sugars, and salt. Get information about a proper diet from your health care  provider, if necessary.  Regular physical exercise is one of the most important things you can do for your health. Most adults should get at least 150 minutes of moderate-intensity exercise (any activity that increases your heart rate and causes you to sweat) each week. In addition, most adults need muscle-strengthening exercises on 2 or more days a week.   Maintain a healthy  weight. The body mass index (BMI) is a screening tool to identify possible weight problems. It provides an estimate of body fat based on height and weight. Your health care provider can find your BMI and can help you achieve or maintain a healthy weight. For males 20 years and older:  A BMI below 18.5 is considered underweight.  A BMI of 18.5 to 24.9 is normal.  A BMI of 25 to 29.9 is considered overweight.  A BMI of 30 and above is considered obese.  Maintain normal blood lipids and cholesterol by exercising and minimizing your intake of saturated fat. Eat a balanced diet with plenty of fruits and vegetables. Blood tests for lipids and cholesterol should begin at age 69 and be repeated every 5 years. If your lipid or cholesterol levels are high, you are over age 11, or you are at high risk for heart disease, you may need your cholesterol levels checked more frequently.Ongoing high lipid and cholesterol levels should be treated with medicines if diet and exercise are not working.  If you smoke, find out from your health care provider how to quit. If you do not use tobacco, do not start.  Lung cancer screening is recommended for adults aged 76-80 years who are at high risk for developing lung cancer because of a history of smoking. A yearly low-dose CT scan of the lungs is recommended for people who have at least a 30-pack-year history of smoking and are current smokers or have quit within the past 15 years. A pack year of smoking is smoking an average of 1 pack of cigarettes a day for 1 year (for example, a 30-pack-year  history of smoking could mean smoking 1 pack a day for 30 years or 2 packs a day for 15 years). Yearly screening should continue until the smoker has stopped smoking for at least 15 years. Yearly screening should be stopped for people who develop a health problem that would prevent them from having lung cancer treatment.  If you choose to drink alcohol, do not have more than 2 drinks Gian day. One drink is considered to be 12 oz (360 mL) of beer, 5 oz (150 mL) of wine, or 1.5 oz (45 mL) of liquor.  Avoid the use of street drugs. Do not share needles with anyone. Ask for help if you need support or instructions about stopping the use of drugs.  High blood pressure causes heart disease and increases the risk of stroke. High blood pressure is more likely to develop in:  People who have blood pressure in the end of the normal range (100-139/85-89 mm Hg).  People who are overweight or obese.  People who are African American.  If you are 63-67 years of age, have your blood pressure checked every 3-5 years. If you are 2 years of age or older, have your blood pressure checked every year. You should have your blood pressure measured twice--once when you are at a hospital or clinic, and once when you are not at a hospital or clinic. Record the average of the two measurements. To check your blood pressure when you are not at a hospital or clinic, you can use:  An automated blood pressure machine at a pharmacy.  A home blood pressure monitor.  If you are 76-32 years old, ask your health care provider if you should take aspirin to prevent heart disease.  Diabetes screening involves taking a blood sample to check your fasting blood sugar level. This should be done  once every 3 years after age 61 if you are at a normal weight and without risk factors for diabetes. Testing should be considered at a younger age or be carried out more frequently if you are overweight and have at least 1 risk factor for  diabetes.  Colorectal cancer can be detected and often prevented. Most routine colorectal cancer screening begins at the age of 20 and continues through age 29. However, your health care provider may recommend screening at an earlier age if you have risk factors for colon cancer. On a yearly basis, your health care provider may provide home test kits to check for hidden blood in the stool. A small camera at the end of a tube may be used to directly examine the colon (sigmoidoscopy or colonoscopy) to detect the earliest forms of colorectal cancer. Talk to your health care provider about this at age 41 when routine screening begins. A direct exam of the colon should be repeated every 5-10 years through age 70, unless early forms of precancerous polyps or small growths are found.  People who are at an increased risk for hepatitis B should be screened for this virus. You are considered at high risk for hepatitis B if:  You were born in a country where hepatitis B occurs often. Talk with your health care provider about which countries are considered high risk.  Your parents were born in a high-risk country and you have not received a shot to protect against hepatitis B (hepatitis B vaccine).  You have HIV or AIDS.  You use needles to inject street drugs.  You live with, or have sex with, someone who has hepatitis B.  You are a man who has sex with other men (MSM).  You get hemodialysis treatment.  You take certain medicines for conditions like cancer, organ transplantation, and autoimmune conditions.  Hepatitis C blood testing is recommended for all people born from [birth year] through 1965 and any individual with known risk factors for hepatitis C.  Healthy men should no longer receive prostate-specific antigen (PSA) blood tests as part of routine cancer screening. Talk to your health care provider about prostate cancer screening.  Testicular cancer screening is not recommended for adolescents or  adult males who have no symptoms. Screening includes self-exam, a health care provider exam, and other screening tests. Consult with your health care provider about any symptoms you have or any concerns you have about testicular cancer.  Practice safe sex. Use condoms and avoid high-risk sexual practices to reduce the spread of sexually transmitted infections (STIs).  You should be screened for STIs, including gonorrhea and chlamydia if:  You are sexually active and are younger than 24 years.  You are older than 24 years, and your health care provider tells you that you are at risk for this type of infection.  Your sexual activity has changed since you were last screened, and you are at an increased risk for chlamydia or gonorrhea. Ask your health care provider if you are at risk.  If you are at risk of being infected with HIV, it is recommended that you take a prescription medicine daily to prevent HIV infection. This is called pre-exposure prophylaxis (PrEP). You are considered at risk if:  You are a man who has sex with other men (MSM).  You are a heterosexual man who is sexually active with multiple partners.  You take drugs by injection.  You are sexually active with a partner who has HIV.  Talk with your  health care provider about whether you are at high risk of being infected with HIV. If you choose to begin PrEP, you should first be tested for HIV. You should then be tested every 3 months for as long as you are taking PrEP.  Use sunscreen. Apply sunscreen liberally and repeatedly throughout the day. You should seek shade when your shadow is shorter than you. Protect yourself by wearing long sleeves, pants, a wide-brimmed hat, and sunglasses year round whenever you are outdoors.  Tell your health care provider of new moles or changes in moles, especially if there is a change in shape or color. Also, tell your health care provider if a mole is larger than the size of a pencil  eraser.  A one-time screening for abdominal aortic aneurysm (AAA) and surgical repair of large AAAs by ultrasound is recommended for men aged 44-75 years who are current or former smokers.  Stay current with your vaccines (immunizations).   This information is not intended to replace advice given to you by your health care provider. Make sure you discuss any questions you have with your health care provider.   Document Released: 11/01/2007 Document Revised: 05/26/2014 Document Reviewed: 09/30/2010 Elsevier Interactive Patient Education [employer].

## 2015-08-18 ENCOUNTER — Other Ambulatory Visit: Payer: Self-pay | Admitting: Internal Medicine

## 2015-09-11 ENCOUNTER — Other Ambulatory Visit (INDEPENDENT_AMBULATORY_CARE_PROVIDER_SITE_OTHER): Payer: Medicare HMO

## 2015-09-11 ENCOUNTER — Encounter: Payer: Self-pay | Admitting: Internal Medicine

## 2015-09-11 ENCOUNTER — Ambulatory Visit (INDEPENDENT_AMBULATORY_CARE_PROVIDER_SITE_OTHER): Payer: Medicare HMO | Admitting: Internal Medicine

## 2015-09-11 VITALS — BP 146/92 | HR 62 | Temp 98.2°F | Resp 16 | Ht 76.0 in | Wt 214.0 lb

## 2015-09-11 DIAGNOSIS — I10 Essential (primary) hypertension: Secondary | ICD-10-CM

## 2015-09-11 DIAGNOSIS — I82812 Embolism and thrombosis of superficial veins of left lower extremities: Secondary | ICD-10-CM

## 2015-09-11 DIAGNOSIS — E559 Vitamin D deficiency, unspecified: Secondary | ICD-10-CM

## 2015-09-11 DIAGNOSIS — Z0000 Encounter for general adult medical examination without abnormal findings: Secondary | ICD-10-CM | POA: Diagnosis not present

## 2015-09-11 DIAGNOSIS — E782 Mixed hyperlipidemia: Secondary | ICD-10-CM

## 2015-09-11 DIAGNOSIS — Z1159 Encounter for screening for other viral diseases: Secondary | ICD-10-CM | POA: Diagnosis not present

## 2015-09-11 LAB — COMPREHENSIVE METABOLIC PANEL
ALBUMIN: 4.5 g/dL (ref 3.5–5.2)
ALK PHOS: 54 U/L (ref 39–117)
ALT: 25 U/L (ref 0–53)
AST: 24 U/L (ref 0–37)
BUN: 23 mg/dL (ref 6–23)
CALCIUM: 9.6 mg/dL (ref 8.4–10.5)
CO2: 29 mEq/L (ref 19–32)
Chloride: 103 mEq/L (ref 96–112)
Creatinine, Ser: 0.87 mg/dL (ref 0.40–1.50)
GFR: 93.03 mL/min (ref >60.00)
Glucose, Bld: 111 mg/dL — ABNORMAL HIGH (ref 70–99)
POTASSIUM: 5.3 meq/L — AB (ref 3.5–5.1)
Sodium: 140 mEq/L (ref 135–145)
TOTAL PROTEIN: 7.3 g/dL (ref 6.0–8.3)
Total Bilirubin: 0.6 mg/dL (ref 0.2–1.2)

## 2015-09-11 LAB — CBC WITH DIFFERENTIAL/PLATELET
Basophils Absolute: 0 10*3/uL (ref 0.0–0.1)
Basophils Relative: 0.6 % (ref 0.0–3.0)
EOS ABS: 0 10*3/uL (ref 0.0–0.7)
EOS PCT: 0.7 % (ref 0.0–5.0)
HEMATOCRIT: 42.9 % (ref 39.0–52.0)
HEMOGLOBIN: 14.8 g/dL (ref 13.0–17.0)
Lymphocytes Relative: 39.3 % (ref 12.0–46.0)
Lymphs Abs: 1.8 10*3/uL (ref 0.7–4.0)
MCHC: 34.5 g/dL (ref 30.0–36.0)
MCV: 89.3 fl (ref 78.0–100.0)
MONO ABS: 0.4 10*3/uL (ref 0.1–1.0)
Monocytes Relative: 9.2 % (ref 3.0–12.0)
Neutro Abs: 2.4 10*3/uL (ref 1.4–7.7)
Neutrophils Relative %: 50.2 % (ref 43.0–77.0)
Platelets: 239 10*3/uL (ref 150.0–400.0)
RBC: 4.8 Mil/uL (ref 4.22–5.81)
RDW: 13.4 % (ref 11.5–15.5)
WBC: 4.7 10*3/uL (ref 4.0–10.5)

## 2015-09-11 LAB — HEPATITIS C ANTIBODY: HCV Ab: NEGATIVE

## 2015-09-11 LAB — LIPID PANEL
CHOL/HDL RATIO: 2
Cholesterol: 190 mg/dL (ref 0–200)
HDL: 82.9 mg/dL (ref >39.00)
LDL Cholesterol: 98 mg/dL (ref 0–99)
NonHDL: 106.96
Triglycerides: 47 mg/dL (ref 0.0–149.0)
VLDL: 9.4 mg/dL (ref 0.0–40.0)

## 2015-09-11 LAB — TSH: TSH: 1.49 u[IU]/mL (ref 0.35–4.50)

## 2015-09-11 NOTE — Progress Notes (Signed)
Subjective:    Patient ID: [name] [name], male    DOB: Oct 02, 1950, 65 y.o.   MRN: [id number]  HPI He is here to establish with a new pcp.   He is here for a physical exam.   Hypertension: He is taking his medication daily. He is compliant with a low sodium diet.  He denies chest pain, palpitations, edema, shortness of breath and regular headaches. He is exercising regularly.  He does monitor his blood pressure at home, it is better controlled at home - 130/78 on average.    Right Shoulder and hand pain:  His shoulder has been painful for 10-12 years.  He did have a steroid injection years ago.  His ROM is good.  He has intermittent pain with certain movements.  His right hand hurts when his shoulder hurts.  His fingers get stuck at times.  His 4-5th fingers are slightly tender and get stuck at times or feels like a tendon gets stuck. His daughter is a PT and he just wants to know exactly what is wrong so he can do PT.  He does not want surgery.    He intentionally lost weight and he feels better.  His knees and back do not hurt.   He wonders what supplements he should and should not take.    Medications and allergies reviewed with patient and updated if appropriate.  Patient Active Problem List   Diagnosis Date Noted  . Vitamin D deficiency 09/07/2014  . Lichenoid keratosis [id number]  . Myalgia and myositis 09/01/2013  . Superficial thrombosis of leg 03/28/2013  . 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 History of colonic polyps 05/15/2008  . Essential hypertension 05/15/2008  . HYPERLIPIDEMIA 05/12/2007  . Elevated prostate specific antigen (PSA) 05/12/2007    Current Outpatient Prescriptions on File Prior to Visit  Medication Sig Dispense Refill  . aspirin 81 MG tablet Take 81 mg by mouth daily.    . benazepril (LOTENSIN) 20 MG tablet TAKE ONE TABLET BY MOUTH ONCE DAILY 90 tablet 0  . Cholecalciferol (VITAMIN D3) 2000 units TABS Take by mouth.    . fish oil-omega-3 fatty acids 1000  MG capsule Take 2 g by mouth daily.     Marland Kitchen MAGNESIUM CITRATE PO Take 400 mg by mouth daily.     No current facility-administered medications on file prior to visit.    Past Medical History  Diagnosis Date  . History of colonic polyps 2003, 2007    hyperplastic  . Hyperlipidemia   . Hypertension   . Benign prostatic hypertrophy with elevated PSA     Dr Alinda Money, High grade PIN w/o malignancy  . Retinal vascular occlusion     Dr Delman Cheadle  . Hx of colonoscopy 2012    neg    Past Surgical History  Procedure Laterality Date  . Appendectomy    . Colonoscopy w/ polypectomy       X2 , Dr Deatra Ina  . Hernia repair  [id number]     Dr Margot Chimes  . Prostate biopsy  09/2010    Dr Alinda Money  . Lipoma excision      X 5  . Lichenoid keratosis resected  8/14    Social History   Social History  . Marital Status: Married    Spouse Name: N/A  . Number of Children: N/A  . Years of Education: N/A   Social History Main Topics  . Smoking status: Not on file  . Smokeless tobacco: Not on file  .  Alcohol Use: 12.6 oz/week    21 Glasses of wine [place] week     Comment:  3 glasses of wine  . Drug Use: No  . Sexual Activity: Not on file   Other Topics Concern  . Not on file   Social History Narrative    Family History  Problem Relation Age of Onset  . Alzheimer's disease Mother   . Heart attack Father 57  . Hypertension Brother   . Hypertension Maternal Grandmother   . Diabetes Neg Hx   . Stroke Neg Hx     Review of Systems  Constitutional: Negative for fever, chills, appetite change, fatigue and unexpected weight change.  HENT: Positive for hearing loss (decreased - his wife thinks he has hearing difficulty) and tinnitus.   Eyes: Positive for visual disturbance (not new - central vision loss in left eye).  Respiratory: Negative for cough, shortness of breath and wheezing.   Cardiovascular: Positive for leg swelling (wears compression sockings - left ankle ). Negative for chest pain and  palpitations.  Gastrointestinal: Negative for nausea, abdominal pain, diarrhea, constipation and blood in stool.       No gerd  Genitourinary: Negative for dysuria and hematuria.  Musculoskeletal: Positive for arthralgias. Negative for myalgias and back pain.  Skin: Negative for color change and rash.  Neurological: Negative for dizziness, light-headedness and headaches.  Psychiatric/Behavioral: Negative for dysphoric mood. The patient is not nervous/anxious.         Objective:   Filed Vitals:   09/11/15 1009  BP: 146/92  Pulse: 62  Temp: 98.2 F (36.8 C)  Resp: 16   Filed Weights   09/11/15 1009  Weight: 214 lb (97.07 kg)   Body mass index is 26.06 kg/(m^2).   Physical Exam Constitutional: He appears well-developed and well-nourished. No distress.  HENT:  Head: Normocephalic and atraumatic.  Right Ear: External ear normal.  Left Ear: External ear normal.  Mouth/Throat: Oropharynx is clear and moist.  Normal ear canals and TM b/l  Eyes: Conjunctivae and EOM are normal.  Neck: Neck supple. No tracheal deviation present. No thyromegaly present.  No carotid bruit  Cardiovascular: Normal rate, regular rhythm, normal heart sounds and intact distal pulses.   No murmur heard. Pulmonary/Chest: Effort normal and breath sounds normal. No respiratory distress. He has no wheezes. He has no rales.  Abdominal: Soft. Bowel sounds are normal. He exhibits no distension. There is no tenderness.  Genitourinary: deferred to urology Musculoskeletal: He exhibits no edema.  Lymphadenopathy:    He has no cervical adenopathy.  Skin: Skin is warm and dry. He is not diaphoretic.  Psychiatric: He has a normal mood and affect. His behavior is normal.       Assessment & Plan:    Physical exam: Screening blood work oredered Immunizations  Up to date  Colonoscopy  Up to date  Eye exams - Up to date  - done in August EKG  Last done 2014, no need to repeat today [facility] Weight - has lost weight Skin  - sees dermatology for skin check Substance abuse - drinks wine at night - has cut down, no excessive use   Right shoulder pain, right hand pain He will see Dr. Tamala Julian for further evaluation and treatment Can do PT through his daughter  See Problem List for Assessment and Plan of chronic medical problems.   Follow up annually

## 2015-09-11 NOTE — Assessment & Plan Note (Signed)
Has some residual swelling Wears compression socks in cooler weather

## 2015-09-11 NOTE — Patient Instructions (Signed)
Test(s) ordered today. Your results will be released to MyChart (or called to you) after review, usually within 72hours after test completion. If any changes need to be made, you will be notified at that same time.  All other Health Maintenance issues reviewed.   All recommended immunizations and age-appropriate screenings are up-to-date or discussed.  No immunizations administered today.   Medications reviewed and updated.  No changes recommended at this time.    Please followup in one year  Health Maintenance, Male A healthy lifestyle and preventative care can promote health and wellness.  Maintain regular health, dental, and eye exams.  Eat a healthy diet. Foods like vegetables, fruits, whole grains, low-fat dairy products, and lean protein foods contain the nutrients you need and are low in calories. Decrease your intake of foods high in solid fats, added sugars, and salt. Get information about a proper diet from your health care provider, if necessary.  Regular physical exercise is one of the most important things you can do for your health. Most adults should get at least 150 minutes of moderate-intensity exercise (any activity that increases your heart rate and causes you to sweat) each week. In addition, most adults need muscle-strengthening exercises on 2 or more days a week.   Maintain a healthy weight. The body mass index (BMI) is a screening tool to identify possible weight problems. It provides an estimate of body fat based on height and weight. Your health care provider can find your BMI and can help you achieve or maintain a healthy weight. For males 20 years and older:  A BMI below 18.5 is considered underweight.  A BMI of 18.5 to 24.9 is normal.  A BMI of 25 to 29.9 is considered overweight.  A BMI of 30 and above is considered obese.  Maintain normal blood lipids and cholesterol by exercising and minimizing your intake of saturated fat. Eat a balanced diet with  plenty of fruits and vegetables. Blood tests for lipids and cholesterol should begin at age 20 and be repeated every 5 years. If your lipid or cholesterol levels are high, you are over age 50, or you are at high risk for heart disease, you may need your cholesterol levels checked more frequently.Ongoing high lipid and cholesterol levels should be treated with medicines if diet and exercise are not working.  If you smoke, find out from your health care provider how to quit. If you do not use tobacco, do not start.  Lung cancer screening is recommended for adults aged 55-80 years who are at high risk for developing lung cancer because of a history of smoking. A yearly low-dose CT scan of the lungs is recommended for people who have at least a 30-pack-year history of smoking and are current smokers or have quit within the past 15 years. A pack year of smoking is smoking an average of 1 pack of cigarettes a day for 1 year (for example, a 30-pack-year history of smoking could mean smoking 1 pack a day for 30 years or 2 packs a day for 15 years). Yearly screening should continue until the smoker has stopped smoking for at least 15 years. Yearly screening should be stopped for people who develop a health problem that would prevent them from having lung cancer treatment.  If you choose to drink alcohol, do not have more than 2 drinks Alson day. One drink is considered to be 12 oz (360 mL) of beer, 5 oz (150 mL) of wine, or 1.5 oz (45   mL) of liquor.  Avoid the use of street drugs. Do not share needles with anyone. Ask for help if you need support or instructions about stopping the use of drugs.  High blood pressure causes heart disease and increases the risk of stroke. High blood pressure is more likely to develop in:  People who have blood pressure in the end of the normal range (100-139/85-89 mm Hg).  People who are overweight or obese.  People who are African American.  If you are 18-39 years of age, have  your blood pressure checked every 3-5 years. If you are 40 years of age or older, have your blood pressure checked every year. You should have your blood pressure measured twice--once when you are at a hospital or clinic, and once when you are not at a hospital or clinic. Record the average of the two measurements. To check your blood pressure when you are not at a hospital or clinic, you can use:  An automated blood pressure machine at a pharmacy.  A home blood pressure monitor.  If you are 45-79 years old, ask your health care provider if you should take aspirin to prevent heart disease.  Diabetes screening involves taking a blood sample to check your fasting blood sugar level. This should be done once every 3 years after age 45 if you are at a normal weight and without risk factors for diabetes. Testing should be considered at a younger age or be carried out more frequently if you are overweight and have at least 1 risk factor for diabetes.  Colorectal cancer can be detected and often prevented. Most routine colorectal cancer screening begins at the age of 50 and continues through age 75. However, your health care provider may recommend screening at an earlier age if you have risk factors for colon cancer. On a yearly basis, your health care provider may provide home test kits to check for hidden blood in the stool. A small camera at the end of a tube may be used to directly examine the colon (sigmoidoscopy or colonoscopy) to detect the earliest forms of colorectal cancer. Talk to your health care provider about this at age 50 when routine screening begins. A direct exam of the colon should be repeated every 5-10 years through age 75, unless early forms of precancerous polyps or small growths are found.  People who are at an increased risk for hepatitis B should be screened for this virus. You are considered at high risk for hepatitis B if:  You were born in a country where hepatitis B occurs often.  Talk with your health care provider about which countries are considered high risk.  Your parents were born in a high-risk country and you have not received a shot to protect against hepatitis B (hepatitis B vaccine).  You have HIV or AIDS.  You use needles to inject street drugs.  You live with, or have sex with, someone who has hepatitis B.  You are a man who has sex with other men (MSM).  You get hemodialysis treatment.  You take certain medicines for conditions like cancer, organ transplantation, and autoimmune conditions.  Hepatitis C blood testing is recommended for all people born from 1945 through 1965 and any individual with known risk factors for hepatitis C.  Healthy men should no longer receive prostate-specific antigen (PSA) blood tests as part of routine cancer screening. Talk to your health care provider about prostate cancer screening.  Testicular cancer screening is not recommended for adolescents   or adult males who have no symptoms. Screening includes self-exam, a health care provider exam, and other screening tests. Consult with your health care provider about any symptoms you have or any concerns you have about testicular cancer.  Practice safe sex. Use condoms and avoid high-risk sexual practices to reduce the spread of sexually transmitted infections (STIs).  You should be screened for STIs, including gonorrhea and chlamydia if:  You are sexually active and are younger than 24 years.  You are older than 24 years, and your health care provider tells you that you are at risk for this type of infection.  Your sexual activity has changed since you were last screened, and you are at an increased risk for chlamydia or gonorrhea. Ask your health care provider if you are at risk.  If you are at risk of being infected with HIV, it is recommended that you take a prescription medicine daily to prevent HIV infection. This is called pre-exposure prophylaxis (PrEP). You are  considered at risk if:  You are a man who has sex with other men (MSM).  You are a heterosexual man who is sexually active with multiple partners.  You take drugs by injection.  You are sexually active with a partner who has HIV.  Talk with your health care provider about whether you are at high risk of being infected with HIV. If you choose to begin PrEP, you should first be tested for HIV. You should then be tested every 3 months for as long as you are taking PrEP.  Use sunscreen. Apply sunscreen liberally and repeatedly throughout the day. You should seek shade when your shadow is shorter than you. Protect yourself by wearing long sleeves, pants, a wide-brimmed hat, and sunglasses year round whenever you are outdoors.  Tell your health care provider of new moles or changes in moles, especially if there is a change in shape or color. Also, tell your health care provider if a mole is larger than the size of a pencil eraser.  A one-time screening for abdominal aortic aneurysm (AAA) and surgical repair of large AAAs by ultrasound is recommended for men aged 65-75 years who are current or former smokers.  Stay current with your vaccines (immunizations).   This information is not intended to replace advice given to you by your health care provider. Make sure you discuss any questions you have with your health care provider.   Document Released: 11/01/2007 Document Revised: 05/26/2014 Document Reviewed: 09/30/2010 Elsevier Interactive Patient Education 2016 [employer]  

## 2015-09-11 NOTE — Assessment & Plan Note (Signed)
Taking fish oil Exercising Has lost weight Check lipid, cmp

## 2015-09-11 NOTE — Progress Notes (Signed)
Pre visit review using our clinic review tool, if applicable. No additional management support is needed unless otherwise documented below in the visit note. 

## 2015-09-11 NOTE — Assessment & Plan Note (Signed)
Taking vit d daily Will check level

## 2015-09-11 NOTE — Assessment & Plan Note (Signed)
Slightly elevated here today but well controlled at home He will continue to monitor Continue current medication Check basic labs

## 2015-09-14 ENCOUNTER — Other Ambulatory Visit: Payer: Self-pay | Admitting: Internal Medicine

## 2015-09-14 MED ORDER — BENAZEPRIL HCL 20 MG PO TABS: 20.0000 mg | ORAL_TABLET | Freq: Every day | ORAL | Status: DC

## 2015-09-14 NOTE — Addendum Note (Signed)
Addended by: Earnstine Regal on: 09/14/2015 08:25 AM   Modules accepted: Orders

## 2015-09-15 LAB — VITAMIN D 1,25 DIHYDROXY
VITAMIN D3 1, 25 (OH): 56 pg/mL
Vitamin D 1, 25 (OH)2 Total: 56 pg/mL (ref 18–72)
Vitamin D2 1, 25 (OH)2: <8 pg/mL

## 2015-09-16 ENCOUNTER — Encounter: Payer: Self-pay | Admitting: Internal Medicine

## 2015-09-27 ENCOUNTER — Ambulatory Visit (INDEPENDENT_AMBULATORY_CARE_PROVIDER_SITE_OTHER): Payer: Medicare HMO | Admitting: Family Medicine

## 2015-09-27 ENCOUNTER — Other Ambulatory Visit: Payer: Self-pay

## 2015-09-27 ENCOUNTER — Encounter: Payer: Self-pay | Admitting: Family Medicine

## 2015-09-27 VITALS — BP 130/82 | HR 71 | Ht 75.98 in | Wt 217.0 lb

## 2015-09-27 DIAGNOSIS — M25511 Pain in right shoulder: Secondary | ICD-10-CM

## 2015-09-27 DIAGNOSIS — M75101 Unspecified rotator cuff tear or rupture of right shoulder, not specified as traumatic: Secondary | ICD-10-CM | POA: Diagnosis not present

## 2015-09-27 NOTE — Assessment & Plan Note (Signed)
Patient does have what appears to be a rotator cuff syndrome. I think that this is a multifactorial with a subacromial bursitis as well as a small partial tear. Patient does have good range of motion. I do not feel that any x-rays are necessary at this time. Given topical anti-inflammatories a try, icing protocol, and patient work with Product/process development scientist to learn home exercises. Patient will try to make these changes and come back and see me again in 3-4 weeks. If continuing have pain we will consider formal physical therapy as well as injections.

## 2015-09-27 NOTE — Patient Instructions (Addendum)
Good to see you.  Ice 20 minutes 2 times daily. Usually after activity and before bed. Exercises 3 times a week.  pennsaid pinkie amount topically 2 times daily as needed.  Avoid activity where you cannot see your hands within your peripheral vision.  Vitamin D 2000IU daily Turmeric 500mg  daily can help with inflammation  See me again in 3 weeks.

## 2015-09-27 NOTE — Progress Notes (Signed)
Pre visit review using our clinic review tool, if applicable. No additional management support is needed unless otherwise documented below in the visit note. 

## 2015-09-27 NOTE — Progress Notes (Signed)
[name] [place], [place] [address] Phone: [phone] Subjective:    I'm seeing this patient by the request  of:  Binnie Rail, MD   CC: Right shoulder pain   [id number] [name] [name] is a 65 y.o. male coming in with complaint of Right shoulder pain. Patient states that this pain is more of a dull throbbing aching pain that happens intermittently. Sometimes he has no pain and symptoms of 6 out of 10 pain. Nothing that stops him from activity but seems to be annoying. Increasing in frequency. Does not rib or any true injury. Has been having this off and on for approximately 2 years. No radiation down the arm sometimes it does feel like he has some numbness. Some activities such as reaching across his chest came be painful. No nighttime pain. Some mild neck pain but no significant association. Does respond to anti-inflammatories when used.    Past Medical History  Diagnosis Date  . History of colonic polyps 2003, 2007    hyperplastic  . Hyperlipidemia   . Hypertension   . Benign prostatic hypertrophy with elevated PSA     Dr Alinda Money, High grade PIN w/o malignancy  . Retinal vascular occlusion     Dr Delman Cheadle  . Hx of colonoscopy 2012    neg   Past Surgical History  Procedure Laterality Date  . Appendectomy    . Colonoscopy w/ polypectomy       X2 , Dr Deatra Ina  . Hernia repair  [id number]     Dr Margot Chimes  . Prostate biopsy  09/2010    Dr Alinda Money  . Lipoma excision      X 5  . Lichenoid keratosis resected  8/14   Social History   Social History  . Marital Status: Married    Spouse Name: N/A  . Number of Children: N/A  . Years of Education: N/A   Social History Main Topics  . Smoking status: Never Smoker   . Smokeless tobacco: None  . Alcohol Use: 12.6 oz/week    21 Glasses of wine [place] week     Comment:  3 glasses of wine  . Drug Use: No  . Sexual Activity: Not Asked   Other Topics Concern  . None   Social History  Narrative   No Known Allergies Family History  Problem Relation Age of Onset  . Alzheimer's disease Mother   . Heart attack Father 52  . Hypertension Brother   . Hypertension Maternal Grandmother   . Diabetes Neg Hx   . Stroke Neg Hx     Past medical history, social, surgical and family history all reviewed in electronic medical record.  No pertanent information unless stated regarding to the chief complaint.   Review of Systems: No headache, visual changes, nausea, vomiting, diarrhea, constipation, dizziness, abdominal pain, skin rash, fevers, chills, night sweats, weight loss, swollen lymph nodes, body aches, joint swelling, muscle aches, chest pain, shortness of breath, mood changes.   Objective Blood pressure 130/82, pulse 71, height 6' 3.98" (1.93 m), weight 217 lb (98.431 kg), SpO2 95 %.  General: No apparent distress alert and oriented x3 mood and affect normal, dressed appropriately.  HEENT: Pupils equal, extraocular movements intact  Respiratory: Patient's speak in full sentences and does not appear short of breath  Cardiovascular: No lower extremity edema, non tender, no erythema  Skin: Warm dry intact with no signs of infection or rash on extremities or on axial  skeleton.  Abdomen: Soft nontender  Neuro: Cranial nerves II through XII are intact, neurovascularly intact in all extremities with 2+ DTRs and 2+ pulses.  Lymph: No lymphadenopathy of posterior or anterior cervical chain or axillae bilaterally.  Gait normal with good balance and coordination.  MSK:  Non tender with full range of motion and good stability and symmetric strength and tone of , elbows, wrist, hip, knee and ankles bilaterally.  Neck: Inspection unremarkable. No palpable stepoffs. Negative Spurling's maneuver. Lacks last 10 of extension. Lacks 15 of right-sided side bending as well Grip strength and sensation normal in bilateral hands Strength good C4 to T1 distribution No sensory change to C4 to  T1 Negative Hoffman sign bilaterally Reflexes normal  Shoulder: Right Inspection reveals no abnormalities, atrophy or asymmetry. Palpation is normal with no tenderness over AC joint or bicipital groove. ROM is full in all planes passively. Rotator cuff strength normal throughout. signs of impingement with positive Neer and Hawkin's tests, but negative empty can sign. Speeds and Yergason's tests normal. No labral pathology noted with negative Obrien's, negative clunk and good stability. Normal scapular function observed. No painful arc and no drop arm sign. No apprehension sign  MSK US performed of: Right This study was ordered, performed, and interpreted by Charlann Boxer D.O.  Shoulder:   Supraspinatus:  Questionable small partial-thickness tear noted an approximate 10%., Bursal bulge seen with shoulder abduction on impingement view. Infraspinatus:  Appears normal on long and transverse views. Significant increase in Doppler flow Subscapularis:  Appears normal on long and transverse views. Positive bursa Teres Minor:  Appears normal on long and transverse views. AC joint:  Mild to moderate arthritis Glenohumeral Joint:  Appears normal without effusion. Glenoid Labrum:  Intact without visualized tears. Biceps Tendon:  Appears normal on long and transverse views, no fraying of tendon, tendon located in intertubercular groove, no subluxation with shoulder internal or external rotation.  Impression: Subacromial bursitis with small tear of the supraspinatus.  Procedure note [id number]; 15 minutes spent for Therapeutic exercises as stated in above notes.  This included exercises focusing on stretching, strengthening, with significant focus on eccentric aspects. Shoulder Exercises that included:  Basic scapular stabilization to include adduction and depression of scapula Scaption, focusing on proper movement and good control Internal and External rotation utilizing a theraband, with elbow tucked  at side entire time Rows with theraband    Proper technique shown and discussed handout in great detail with ATC.  All questions were discussed and answered.      Impression and Recommendations:     This case required medical decision making of moderate complexity.      Note: This dictation was prepared with Dragon dictation along with smaller phrase technology. Any transcriptional errors that result from this process are unintentional.

## 2015-10-22 ENCOUNTER — Encounter: Payer: Self-pay | Admitting: Family Medicine

## 2015-10-22 ENCOUNTER — Ambulatory Visit (INDEPENDENT_AMBULATORY_CARE_PROVIDER_SITE_OTHER): Payer: Medicare HMO | Admitting: Family Medicine

## 2015-10-22 ENCOUNTER — Other Ambulatory Visit: Payer: Self-pay

## 2015-10-22 VITALS — BP 122/70 | HR 63 | Ht 76.0 in | Wt 214.0 lb

## 2015-10-22 DIAGNOSIS — M75101 Unspecified rotator cuff tear or rupture of right shoulder, not specified as traumatic: Secondary | ICD-10-CM | POA: Diagnosis not present

## 2015-10-22 DIAGNOSIS — M25511 Pain in right shoulder: Secondary | ICD-10-CM

## 2015-10-22 NOTE — Assessment & Plan Note (Signed)
Patient given injection today and tolerated the procedure well. We discussed icing regimen and home exercises. We discussed formal physical therapy and patient states he will work with his daughter. Encourage him to continue the vitamin supplementations. Patient will follow-up with me again in 4 weeks for further evaluation. Continuing have trouble we will either consider formal physical therapy or if any worsening symptoms radiation of pain I would consider advanced imaging. Differential would include labral pathology. Spent  25 minutes with patient face-to-face and had greater than 50% of counseling including as described above in assessment and plan.

## 2015-10-22 NOTE — Progress Notes (Signed)
Pre visit review using our clinic review tool, if applicable. No additional management support is needed unless otherwise documented below in the visit note. 

## 2015-10-22 NOTE — Progress Notes (Signed)
[name] [place], [place] [address] Phone: [phone] Subjective:    I'm seeing this patient by the request  of:  Binnie Rail, MD   CC: Right shoulder pain Follow-up  [id number] [name] [name] is a 65 y.o. male coming in with complaint of Right shoulder pain. Patient was found to have a subacromial bursitis and a very mild partial tear of the supraspinatus tendon. Patient is been doing the home exercises occasionally. Is taking the over-the-counter medications, not icing. States though that he is about 65% better. Still not all the way better. Trying to stay active and states that the shoulder doesn't stop him from doing activities but he does modify his activities.    Past Medical History  Diagnosis Date  . History of colonic polyps 2003, 2007    hyperplastic  . Hyperlipidemia   . Hypertension   . Benign prostatic hypertrophy with elevated PSA     Dr Alinda Money, High grade PIN w/o malignancy  . Retinal vascular occlusion     Dr Delman Cheadle  . Hx of colonoscopy 2012    neg   Past Surgical History  Procedure Laterality Date  . Appendectomy    . Colonoscopy w/ polypectomy       X2 , Dr Deatra Ina  . Hernia repair  [id number]     Dr Margot Chimes  . Prostate biopsy  09/2010    Dr Alinda Money  . Lipoma excision      X 5  . Lichenoid keratosis resected  8/14   Social History   Social History  . Marital Status: Married    Spouse Name: N/A  . Number of Children: N/A  . Years of Education: N/A   Social History Main Topics  . Smoking status: Never Smoker   . Smokeless tobacco: None  . Alcohol Use: 12.6 oz/week    21 Glasses of wine [place] week     Comment:  3 glasses of wine  . Drug Use: No  . Sexual Activity: Not Asked   Other Topics Concern  . None   Social History Narrative   No Known Allergies Family History  Problem Relation Age of Onset  . Alzheimer's disease Mother   . Heart attack Father 17  . Hypertension Brother   .  Hypertension Maternal Grandmother   . Diabetes Neg Hx   . Stroke Neg Hx     Past medical history, social, surgical and family history all reviewed in electronic medical record.  No pertanent information unless stated regarding to the chief complaint.   Review of Systems: No headache, visual changes, nausea, vomiting, diarrhea, constipation, dizziness, abdominal pain, skin rash, fevers, chills, night sweats, weight loss, swollen lymph nodes, body aches, joint swelling, muscle aches, chest pain, shortness of breath, mood changes.   Objective Blood pressure 122/70, pulse 63, height 6\' 4"  (1.93 m), weight 214 lb (97.07 kg), SpO2 97 %.  General: No apparent distress alert and oriented x3 mood and affect normal, dressed appropriately.  HEENT: Pupils equal, extraocular movements intact  Respiratory: Patient's speak in full sentences and does not appear short of breath  Cardiovascular: No lower extremity edema, non tender, no erythema  Skin: Warm dry intact with no signs of infection or rash on extremities or on axial skeleton.  Abdomen: Soft nontender  Neuro: Cranial nerves II through XII are intact, neurovascularly intact in all extremities with 2+ DTRs and 2+ pulses.  Lymph: No lymphadenopathy of posterior or anterior cervical chain  or axillae bilaterally.  Gait normal with good balance and coordination.  MSK:  Non tender with full range of motion and good stability and symmetric strength and tone of , elbows, wrist, hip, knee and ankles bilaterally.  Neck: Inspection unremarkable. No palpable stepoffs. Negative Spurling's maneuver. Lacks last 10 of extension. Lacks 15 of right-sided side bending as well Grip strength and sensation normal in bilateral hands Strength good C4 to T1 distribution No sensory change to C4 to T1 Negative Hoffman sign bilaterally Reflexes normal  Shoulder: Right Inspection reveals no abnormalities, atrophy or asymmetry. Palpation is normal with no tenderness  over AC joint or bicipital groove. ROM is full in all planes passively. Rotator cuff strength normal throughout. signs of impingement with positive Neer and Hawkin's tests, but negative empty can sign. Speeds and Yergason's tests normal. No labral pathology noted with negative Obrien's, negative clunk and good stability. Normal scapular function observed. No painful arc and no drop arm sign. No apprehension sign Contralateral side unremarkable Minimal change from previous exam  Procedure: Real-time Ultrasound Guided Injection of right glenohumeral joint Device: GE Logiq E  Ultrasound guided injection is preferred based studies that show increased duration, increased effect, greater accuracy, decreased procedural pain, increased response rate with ultrasound guided versus blind injection.  Verbal informed consent obtained.  Time-out conducted.  Noted no overlying erythema, induration, or other signs of local infection.  Skin prepped in a sterile fashion.  Local anesthesia: Topical Ethyl chloride.  With sterile technique and under real time ultrasound guidance:  Joint visualized.  23g 1  inch needle inserted posterior approach. Pictures taken for needle placement. Patient did have injection of 2 cc of 1% lidocaine, 2 cc of 0.5% Marcaine, and 1.0 cc of Kenalog 40 mg/dL. Completed without difficulty  Pain immediately resolved suggesting accurate placement of the medication.  Advised to call if fevers/chills, erythema, induration, drainage, or persistent bleeding.  Images permanently stored and available for review in the ultrasound unit.  Impression: Technically successful ultrasound guided injection.      Impression and Recommendations:     This case required medical decision making of moderate complexity.      Note: This dictation was prepared with Dragon dictation along with smaller phrase technology. Any transcriptional errors that result from this process are unintentional.

## 2015-10-22 NOTE — Patient Instructions (Signed)
Goo dot see you  Ice is your friend Talk to your daughter about some exercises Try to do the exercises 2-3 times a week.  See me again in 4 weeks to make sure you are doing fine

## 2015-11-21 ENCOUNTER — Ambulatory Visit (INDEPENDENT_AMBULATORY_CARE_PROVIDER_SITE_OTHER): Payer: Medicare HMO | Admitting: Family Medicine

## 2015-11-21 ENCOUNTER — Encounter: Payer: Self-pay | Admitting: Family Medicine

## 2015-11-21 VITALS — BP 118/74 | HR 69 | Ht 76.0 in | Wt 215.0 lb

## 2015-11-21 DIAGNOSIS — M75101 Unspecified rotator cuff tear or rupture of right shoulder, not specified as traumatic: Secondary | ICD-10-CM | POA: Diagnosis not present

## 2015-11-21 NOTE — Progress Notes (Signed)
[name], [place] [address] Phone: [phone] Subjective:    I'm seeing this patient by the request  of:  Binnie Rail, MD   CC: Right shoulder pain Follow-up  [id number] [name] [name] is a 65 y.o. male coming in with complaint of Right shoulder pain. Patient was found to have a subacromial bursitis and a very mild partial tear of the supraspinatus tendon. Patient was given an injection at last follow-up. States that he is doing better. States that he is 90% better. Still has some mild discomfort overall. Usually at night. Still has some mild with hematoma weakness but very minimal.    Past Medical History  Diagnosis Date  . History of colonic polyps 2003, 2007    hyperplastic  . Hyperlipidemia   . Hypertension   . Benign prostatic hypertrophy with elevated PSA     Dr Alinda Money, High grade PIN w/o malignancy  . Retinal vascular occlusion     Dr Delman Cheadle  . Hx of colonoscopy 2012    neg   Past Surgical History  Procedure Laterality Date  . Appendectomy    . Colonoscopy w/ polypectomy       X2 , Dr Deatra Ina  . Hernia repair  [id number]     Dr Margot Chimes  . Prostate biopsy  09/2010    Dr Alinda Money  . Lipoma excision      X 5  . Lichenoid keratosis resected  8/14   Social History   Social History  . Marital Status: Married    Spouse Name: N/A  . Number of Children: N/A  . Years of Education: N/A   Social History Main Topics  . Smoking status: Never Smoker   . Smokeless tobacco: None  . Alcohol Use: 12.6 oz/week    21 Glasses of wine [name] week     Comment:  3 glasses of wine  . Drug Use: No  . Sexual Activity: Not Asked   Other Topics Concern  . None   Social History Narrative   No Known Allergies Family History  Problem Relation Age of Onset  . Alzheimer's disease Mother   . Heart attack Father 62  . Hypertension Brother   . Hypertension Maternal Grandmother   . Diabetes Neg Hx   . Stroke Neg Hx     Past  medical history, social, surgical and family history all reviewed in electronic medical record.  No pertanent information unless stated regarding to the chief complaint.   Review of Systems: No headache, visual changes, nausea, vomiting, diarrhea, constipation, dizziness, abdominal pain, skin rash, fevers, chills, night sweats, weight loss, swollen lymph nodes, body aches, joint swelling, muscle aches, chest pain, shortness of breath, mood changes.   Objective Blood pressure 118/74, pulse 69, height 6\' 4"  (1.93 m), weight 215 lb (97.523 kg), SpO2 97 %.  General: No apparent distress alert and oriented x3 mood and affect normal, dressed appropriately.  HEENT: Pupils equal, extraocular movements intact  Respiratory: Patient's speak in full sentences and does not appear short of breath  Cardiovascular: No lower extremity edema, non tender, no erythema  Skin: Warm dry intact with no signs of infection or rash on extremities or on axial skeleton.  Abdomen: Soft nontender  Neuro: Cranial nerves II through XII are intact, neurovascularly intact in all extremities with 2+ DTRs and 2+ pulses.  Lymph: No lymphadenopathy of posterior or anterior cervical chain or axillae bilaterally.  Gait normal with good balance and coordination.  MSK:  Non tender with full range of motion and good stability and symmetric strength and tone of , elbows, wrist, hip, knee and ankles bilaterally.  Neck: Inspection unremarkable. No palpable stepoffs. Negative Spurling's maneuver. Lacks last 10 of extension. Lacks 15 of right-sided side bending as well Grip strength and sensation normal in bilateral hands Strength good C4 to T1 distribution No sensory change to C4 to T1 Negative Hoffman sign bilaterally Reflexes normal  Shoulder: Right Inspection reveals no abnormalities, atrophy or asymmetry. Palpation is normal with no tenderness over AC joint or bicipital groove. ROM is full in all planes passively. Rotator cuff  strength normal throughout. Improvement with minimal signs of impingement Speeds and Yergason's tests normal. No labral pathology noted with negative Obrien's, negative clunk and good stability. Normal scapular function observed. No painful arc and no drop arm sign. No apprehension sign Contralateral side unremarkable    Impression and Recommendations:     This case required medical decision making of moderate complexity.      Note: This dictation was prepared with Dragon dictation along with smaller phrase technology. Any transcriptional errors that result from this process are unintentional.

## 2015-11-21 NOTE — Progress Notes (Signed)
Pre visit review using our clinic review tool, if applicable. No additional management support is needed unless otherwise documented below in the visit note. 

## 2015-11-21 NOTE — Assessment & Plan Note (Signed)
Patient is doing significantly better. We discussed icing regimen. We discussed continuing with conservative therapy. Decline formal physical therapy because now daughter will be moving in and he thinks he will do better with the exercises on a regular basis. Patient and will follow-up with me again in 6 weeks. Likely we'll ultrasound at that time to make sure healing appropriately.

## 2015-11-21 NOTE — Patient Instructions (Signed)
Good to see you  Ice is your friend I think still. You can do it a little less at night if you would like to see what happens.  Stay active I would like to get you back in the gym but drop weight 50% and increase 10% a week.  Keep hands within peripheral vision at all times.  Have your daughter go to the gym and make sure everything is safe If you change your mind on the nitro.... Nitroglycerin Protocol   Apply 1/4 nitroglycerin patch to affected area daily.  Change position of patch within the affected area every 24 hours.  You may experience a headache during the first 1-2 weeks of using the patch, these should subside.  If you experience headaches after beginning nitroglycerin patch treatment, you may take your preferred over the counter pain reliever.  Another side effect of the nitroglycerin patch is skin irritation or rash related to patch adhesive.  Please notify our office if you develop more severe headaches or rash, and stop the patch.  Tendon healing with nitroglycerin patch may require 12 to 24 weeks depending on the extent of injury.  Men should not use if taking Viagra, Cialis, or Levitra.   Do not use if you have migraines or rosacea.   See me again in 6 weeks to make sure resolved.

## 2016-02-26 ENCOUNTER — Encounter: Payer: Self-pay | Admitting: Internal Medicine

## 2016-03-21 ENCOUNTER — Ambulatory Visit (INDEPENDENT_AMBULATORY_CARE_PROVIDER_SITE_OTHER): Payer: Medicare HMO | Admitting: Family Medicine

## 2016-03-21 ENCOUNTER — Encounter: Payer: Self-pay | Admitting: Family Medicine

## 2016-03-21 DIAGNOSIS — M75101 Unspecified rotator cuff tear or rupture of right shoulder, not specified as traumatic: Secondary | ICD-10-CM

## 2016-03-21 MED ORDER — GABAPENTIN 100 MG PO CAPS: 200.0000 mg | ORAL_CAPSULE | Freq: Every day | ORAL | 3 refills | Status: DC

## 2016-03-21 MED ORDER — NITROGLYCERIN 0.2 MG/HR TD PT24: MEDICATED_PATCH | TRANSDERMAL | 1 refills | Status: DC

## 2016-03-21 NOTE — Progress Notes (Signed)
[name] [address], [place] [address] Phone: [phone] Subjective:    I'm seeing this patient by the request  of:  Binnie Rail, MD   CC: Right shoulder pain Follow-up  [id number]  [name] [name] is a 65 y.o. male coming in with complaint of Right shoulder pain. Patient was found to have a subacromial bursitis and a very mild partial tear of the supraspinatus tendon. Patient had responded well to injection previously. States though worsening pain.  States unable to play golf.  No radiation but stay in shoulder region and a little in neck.  Waking him up at night as well.      Past Medical History:  Diagnosis Date  . Benign prostatic hypertrophy with elevated PSA    Dr Alinda Money, High grade PIN w/o malignancy  . History of colonic polyps 2003, 2007   hyperplastic  . Hx of colonoscopy 2012   neg  . Hyperlipidemia   . Hypertension   . Retinal vascular occlusion    Dr Delman Cheadle   Past Surgical History:  Procedure Laterality Date  . APPENDECTOMY    . COLONOSCOPY W/ POLYPECTOMY      X2 , Dr Deatra Ina  . HERNIA REPAIR  2009,2011    Dr Margot Chimes  . lichenoid keratosis resected  8/14  . LIPOMA EXCISION     X 5  . PROSTATE BIOPSY  09/2010   Dr Alinda Money   Social History   Social History  . Marital status: Married    Spouse name: N/A  . Number of children: N/A  . Years of education: N/A   Social History Main Topics  . Smoking status: Never Smoker  . Smokeless tobacco: None  . Alcohol use 12.6 oz/week    21 Glasses of wine [place] week     Comment:  3 glasses of wine  . Drug use: No  . Sexual activity: Not Asked   Other Topics Concern  . None   Social History Narrative  . None   No Known Allergies Family History  Problem Relation Age of Onset  . Alzheimer's disease Mother   . Heart attack Father 41  . Hypertension Brother   . Hypertension Maternal Grandmother   . Diabetes Neg Hx   . Stroke Neg Hx     Past medical history,  social, surgical and family history all reviewed in electronic medical record.  No pertanent information unless stated regarding to the chief complaint.   Review of Systems: No headache, visual changes, nausea, vomiting, diarrhea, constipation, dizziness, abdominal pain, skin rash, fevers, chills, night sweats, weight loss, swollen lymph nodes, body aches, joint swelling, muscle aches, chest pain, shortness of breath, mood changes.   Objective  Blood pressure 110/80, pulse 77, height 6\' 4"  (1.93 m), weight 221 lb (100.2 kg), SpO2 98 %.  General: No apparent distress alert and oriented x3 mood and affect normal, dressed appropriately.  HEENT: Pupils equal, extraocular movements intact  Respiratory: Patient's speak in full sentences and does not appear short of breath  Cardiovascular: No lower extremity edema, non tender, no erythema  Skin: Warm dry intact with no signs of infection or rash on extremities or on axial skeleton.  Abdomen: Soft nontender  Neuro: Cranial nerves II through XII are intact, neurovascularly intact in all extremities with 2+ DTRs and 2+ pulses.  Lymph: No lymphadenopathy of posterior or anterior cervical chain or axillae bilaterally.  Gait normal with good balance and coordination.  MSK:  Non tender with full range of motion and good stability and symmetric strength and tone of , elbows, wrist, hip, knee and ankles bilaterally.  Neck: Inspection unremarkable. No palpable stepoffs. Negative Spurling's maneuver. Lacks last 10 of extension.  Grip strength and sensation normal in bilateral hands Strength good C4 to T1 distribution No sensory change to C4 to T1 Negative Hoffman sign bilaterally Reflexes normal  Shoulder: Right Inspection reveals no abnormalities, atrophy or asymmetry. Palpation is normal with no tenderness over AC joint or bicipital groove. Mild limitation in external range of motion RTC strength  4/5 strength which is new from previous exam  Speeds  and Yergason's tests normal. Mild positive O'Brien's Normal scapular function observed. No painful arc and no drop arm sign. No apprehension sign Contralateral side unremarkable  After informed written and verbal consent, patient was seated on exam table. Right shoulder was prepped with alcohol swab and utilizing posterior approach, patient's right glenohumeral space was injected with 4:1  marcaine 0.5%: Kenalog 40mg /dL. Patient tolerated the procedure well without immediate complications.    Impression and Recommendations:     This case required medical decision making of moderate complexity.      Note: This dictation was prepared with Dragon dictation along with smaller phrase technology. Any transcriptional errors that result from this process are unintentional.

## 2016-03-21 NOTE — Assessment & Plan Note (Addendum)
Worsening symptoms. Patient was given an injection today. Tolerated the procedure well. We discussed icing regimen and home exercises. We discussed worsening symptoms at this point advance imaging would be warranted. Started on nitroglycerin warned of potential side effects. Patient will take the gabapentin on a more regular basis for nighttime relief. Follow-up again 4 weeks.

## 2016-03-21 NOTE — Patient Instructions (Signed)
Good to see you  pennsaid pinkie amount topically 2 times daily as needed.  Ice 20 minutes 2 times daily. Usually after activity and before bed. Gabapentin 200mg  at night Nitroglycerin Protocol   Apply 1/4 nitroglycerin patch to affected area daily.  Change position of patch within the affected area every 24 hours.  You may experience a headache during the first 1-2 weeks of using the patch, these should subside.  If you experience headaches after beginning nitroglycerin patch treatment, you may take your preferred over the counter pain reliever.  Another side effect of the nitroglycerin patch is skin irritation or rash related to patch adhesive.  Please notify our office if you develop more severe headaches or rash, and stop the patch.  Tendon healing with nitroglycerin patch may require 12 to 24 weeks depending on the extent of injury.  Men should not use if taking Viagra, Cialis, or Levitra.   Do not use if you have migraines or rosacea.  Injected again today  Keep up with the activity but keep hands within peripheral vision.  See me again in 4 weeks.

## 2016-05-29 ENCOUNTER — Ambulatory Visit (INDEPENDENT_AMBULATORY_CARE_PROVIDER_SITE_OTHER): Payer: PPO | Admitting: Internal Medicine

## 2016-05-29 ENCOUNTER — Encounter: Payer: Self-pay | Admitting: Internal Medicine

## 2016-05-29 VITALS — BP 122/84 | HR 86 | Temp 98.4°F | Resp 16 | Wt 216.0 lb

## 2016-05-29 DIAGNOSIS — I10 Essential (primary) hypertension: Secondary | ICD-10-CM

## 2016-05-29 DIAGNOSIS — R05 Cough: Secondary | ICD-10-CM | POA: Diagnosis not present

## 2016-05-29 DIAGNOSIS — R059 Cough, unspecified: Secondary | ICD-10-CM

## 2016-05-29 MED ORDER — IRBESARTAN 150 MG PO TABS: 150.0000 mg | ORAL_TABLET | Freq: Every day | ORAL | 1 refills | Status: DC

## 2016-05-29 MED ORDER — BENZONATATE 200 MG PO CAPS: 200.0000 mg | ORAL_CAPSULE | Freq: Three times a day (TID) | ORAL | 0 refills | Status: DC | PRN

## 2016-05-29 MED ORDER — AZITHROMYCIN 250 MG PO TABS: ORAL_TABLET | ORAL | 0 refills | Status: DC

## 2016-05-29 NOTE — Assessment & Plan Note (Signed)
BP controlled, but experiencing dry cough Possibly ACE-I induced cough Stop benazepril Start avapro Monitor BP - call with questions or concerns

## 2016-05-29 NOTE — Assessment & Plan Note (Signed)
Dry cough, minimal other symptoms Likely viral or ACE-I induced Less likely bacterial Tessalon perles Stop ACE-I - start avapro 150 mg daily Monitor BP at home   Will give rx for a zpak   - use if symptoms not better in a few days

## 2016-05-29 NOTE — Progress Notes (Signed)
Pre visit review using our clinic review tool, if applicable. No additional management support is needed unless otherwise documented below in the visit note. 

## 2016-05-29 NOTE — Progress Notes (Signed)
Subjective:    Patient ID: [name], male    DOB: 1951/03/14, 66 y.o.   MRN: [id number]  HPI He is here for an acute visit for cold symptoms.  His symptoms got worse last week, but he has been coughing since November.  He has been coughing - it is barely productive.  He has fatigue. He has mild nasal congestion, PND, headaches and lightheadedness.  She denies fever, SOB, wheeze and sinus pain.     He has tried taking Excedrin, but nothing else.   He takes benazepril daily.   He is traveling to [place] next week.   Medications and allergies reviewed with patient and updated if appropriate.  Patient Active Problem List   Diagnosis Date Noted  . Rotator cuff syndrome of right shoulder 09/27/2015  . Vitamin D deficiency 09/07/2014  . Lichenoid keratosis [id number]  . Myalgia and myositis 09/01/2013  . Superficial thrombosis of leg 03/28/2013  . 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 History of colonic polyps 05/15/2008  . Essential hypertension 05/15/2008  . HYPERLIPIDEMIA 05/12/2007  . Elevated prostate specific antigen (PSA) 05/12/2007    Current Outpatient Prescriptions on File Prior to Visit  Medication Sig Dispense Refill  . aspirin 81 MG tablet Take 81 mg by mouth daily.    . benazepril (LOTENSIN) 20 MG tablet Take 1 tablet (20 mg total) by mouth daily. 90 tablet 3  . Cholecalciferol (VITAMIN D3) 2000 units TABS Take by mouth.    . fish oil-omega-3 fatty acids 1000 MG capsule Take 2 g by mouth daily.     Marland Kitchen gabapentin (NEURONTIN) 100 MG capsule Take 2 capsules (200 mg total) by mouth at bedtime. 60 capsule 3  . MAGNESIUM CITRATE PO Take 400 mg by mouth daily.    . nitroGLYCERIN (NITRODUR - DOSED IN MG/24 HR) 0.2 mg/hr patch 1/4 patch daily 30 patch 1   No current facility-administered medications on file prior to visit.     Past Medical History:  Diagnosis Date  . Benign prostatic hypertrophy with elevated PSA    Dr Alinda Money, High grade PIN w/o malignancy  . History  of colonic polyps 2003, 2007   hyperplastic  . Hx of colonoscopy 2012   neg  . Hyperlipidemia   . Hypertension   . Retinal vascular occlusion    Dr Delman Cheadle    Past Surgical History:  Procedure Laterality Date  . APPENDECTOMY    . COLONOSCOPY W/ POLYPECTOMY      X2 , Dr Deatra Ina  . HERNIA REPAIR  2009,2011    Dr Margot Chimes  . lichenoid keratosis resected  8/14  . LIPOMA EXCISION     X 5  . PROSTATE BIOPSY  09/2010   Dr Alinda Money    Social History   Social History  . Marital status: Married    Spouse name: N/A  . Number of children: N/A  . Years of education: N/A   Social History Main Topics  . Smoking status: Never Smoker  . Smokeless tobacco: Not on file  . Alcohol use 12.6 oz/week    21 Glasses of wine [place] week     Comment:  3 glasses of wine  . Drug use: No  . Sexual activity: Not on file   Other Topics Concern  . Not on file   Social History Narrative  . No narrative on file    Family History  Problem Relation Age of Onset  . Alzheimer's disease Mother   . Heart attack Father 23  .  Hypertension Brother   . Hypertension Maternal Grandmother   . Diabetes Neg Hx   . Stroke Neg Hx     Review of Systems  Constitutional: Positive for chills. Negative for fever.  HENT: Positive for congestion and postnasal drip. Negative for ear pain, sinus pain and sore throat.   Respiratory: Positive for cough. Negative for chest tightness, shortness of breath and wheezing.   Cardiovascular: Negative for chest pain.  Gastrointestinal: Negative for abdominal pain, diarrhea and nausea.  Musculoskeletal: Negative for myalgias.  Neurological: Positive for light-headedness and headaches.       Objective:   Vitals:   05/29/16 1106  BP: 122/84  Pulse: 86  Resp: 16  Temp: 98.4 F (36.9 C)   Filed Weights   05/29/16 1106  Weight: 216 lb (98 kg)   Body mass index is 26.29 kg/m.  Wt Readings from Last 3 Encounters:  05/29/16 216 lb (98 kg)  03/21/16 221 lb (100.2 kg)    11/21/15 215 lb (97.5 kg)     Physical Exam GENERAL APPEARANCE: Appears stated age, well appearing, NAD EYES: conjunctiva clear, no icterus HEENT: bilateral tympanic membranes and ear canals normal, oropharynx with mild erythema, no thyromegaly, trachea midline, no cervical or supraclavicular lymphadenopathy LUNGS: Clear to auscultation without wheeze or crackles, unlabored breathing, good air entry bilaterally HEART: Normal S1,S2 without murmurs EXTREMITIES: Without clubbing, cyanosis, or edema        Assessment & Plan:   See Problem List for Assessment and Plan of chronic medical problems.

## 2016-05-29 NOTE — Patient Instructions (Signed)
  Medications reviewed and updated.  Changes include changing benazepril to avapro 150 mg daily for your blood pressure.  Monitor your BP at home and if there are concerns call me.   Your prescription(s) have been submitted to your pharmacy. Please take as directed and contact our office if you believe you are having problem(s) with the medication(s).  A cough medication was sent to your pharmacy.  An antibiotic was also sent - use if your symptoms do not improve.

## 2016-09-14 ENCOUNTER — Encounter: Payer: Self-pay | Admitting: Internal Medicine

## 2016-09-14 NOTE — Progress Notes (Signed)
Subjective:    Patient ID: [name] [name], male    DOB: 08-03-1950, 66 y.o.   MRN: [id number]  HPI Here for medicare wellness exam and annual physical exam.   I have personally reviewed and have noted 1.The patient's medical and social history 2.Their use of alcohol, tobacco or illicit drugs 3.Their current medications and supplements 4.The patient's functional ability including ADL's, fall risks, home safety risks and hearing or visual impairment. 5.Diet and physical activities 6.Evidence for depression or mood disorders 7.Care team reviewed  - urology - Dr Alinda Money, derm - dr lomax  He had an infection in his left lower leg a few years ago.  He has mild redness in the lower leg.  He denies itching and redness.   Shoulder getting better.  Still not 100%.    Are there smokers in your home (other than you)? No  Risk Factors Exercise:  Yard work, Office manager, gym in [employer] Dietary issues discussed: healthy diet, well balanced, eats at home  Cardiac risk factors: advanced age, hypertension, Depression Screen  Have you felt down, depressed or hopeless? No  Have you felt little interest or pleasure in doing things?  No  Activities of Daily Living In your present state of health, do you have any difficulty performing the following activities?:  Driving? No Managing money?  No Feeding yourself? No Getting from bed to chair? No Climbing a flight of stairs? No Preparing food and eating?: No Bathing or showering? No Getting dressed: No Getting to/using the toilet? No Moving around from place to place: No In the past year have you fallen or had a near fall?: No   Are you sexually active?  No  Do you have more than one partner?  N/A  Hearing Difficulties: yes, mild Do you often ask people to speak up or repeat themselves? No Do you experience ringing or noises in your ears? yes Do you have difficulty understanding  soft or whispered voices? yes Vision:              Any change in vision: no             Up to date with eye exam:   yes Memory:  Do you feel that you have a problem with memory? No  Do you often misplace items? No  Do you feel safe at home?  Yes  Cognitive Testing  Alert, Orientated? Yes  Normal Appearance? Yes  Recall of three objects?  Yes  Can perform simple calculations? Yes  Displays appropriate judgment? Yes  Can read the correct time from a watch face? Yes   Advanced Directives have been discussed with the patient? Yes - in place   Medications and allergies reviewed with patient and updated if appropriate.  Patient Active Problem List   Diagnosis Date Noted  . Cough 05/29/2016  . Rotator cuff syndrome of right shoulder 09/27/2015  . Vitamin D deficiency 09/07/2014  . Lichenoid keratosis 63/05/6008  . Myalgia and myositis 09/01/2013  . Superficial thrombosis of leg 03/28/2013  . 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 History of colonic polyps 05/15/2008  . Essential hypertension 05/15/2008  . HYPERLIPIDEMIA 05/12/2007  . Elevated prostate specific antigen (PSA) 05/12/2007    Current Outpatient Prescriptions on File Prior to Visit  Medication Sig Dispense Refill  . aspirin 81 MG tablet Take 81 mg by mouth daily.    . Cholecalciferol (VITAMIN D3) 2000 units TABS Take by mouth.    . fish oil-omega-3 fatty acids  1000 MG capsule Take 2 g by mouth daily.     Marland Kitchen MAGNESIUM CITRATE PO Take 400 mg by mouth daily.    . nitroGLYCERIN (NITRODUR - DOSED IN MG/24 HR) 0.2 mg/hr patch 1/4 patch daily 30 patch 1   No current facility-administered medications on file prior to visit.     Past Medical History:  Diagnosis Date  . Benign prostatic hypertrophy with elevated PSA    Dr Alinda Money, High grade PIN w/o malignancy  . History of colonic polyps 2003, 2007   hyperplastic  . Hx of colonoscopy 2012   neg  . Hyperlipidemia   . Hypertension   . Retinal vascular occlusion    Dr Delman Cheadle      Past Surgical History:  Procedure Laterality Date  . APPENDECTOMY    . COLONOSCOPY W/ POLYPECTOMY      X2 , Dr Deatra Ina  . HERNIA REPAIR  2009,2011    Dr Margot Chimes  . lichenoid keratosis resected  8/14  . LIPOMA EXCISION     X 5  . PROSTATE BIOPSY  09/2010   Dr Alinda Money    Social History   Social History  . Marital status: Married    Spouse name: N/A  . Number of children: N/A  . Years of education: N/A   Social History Main Topics  . Smoking status: Never Smoker  . Smokeless tobacco: Never Used  . Alcohol use 12.6 oz/week    21 Glasses of wine Yassir week     Comment:  3 glasses of wine  . Drug use: No  . Sexual activity: Not Asked   Other Topics Concern  . None   Social History Narrative  . None    Family History  Problem Relation Age of Onset  . Alzheimer's disease Mother   . Heart attack Father 34  . Hypertension Brother   . Hypertension Maternal Grandmother   . Diabetes Neg Hx   . Stroke Neg Hx     Review of Systems  Constitutional: Negative for appetite change, chills, fatigue and fever.  HENT: Positive for hearing loss, postnasal drip and tinnitus.   Eyes: Negative for visual disturbance.  Respiratory: Positive for cough (mild, dry). Negative for shortness of breath and wheezing.   Cardiovascular: Positive for leg swelling (mild, occasional). Negative for chest pain and palpitations.  Gastrointestinal: Negative for abdominal pain, blood in stool, constipation, diarrhea and nausea.       No gerd  Musculoskeletal: Negative for arthralgias and back pain.  Skin: Negative for color change and rash.  Neurological: Negative for dizziness, light-headedness and headaches.  Psychiatric/Behavioral: Negative for dysphoric mood. The patient is not nervous/anxious.        Objective:   Vitals:   09/15/16 1341  BP: 114/76  Pulse: 71  Resp: 16  Temp: 98 F (36.7 C)   Filed Weights   09/15/16 1341  Weight: 219 lb (99.3 kg)   Body mass index is 26.66  kg/m.  Wt Readings from Last 3 Encounters:  09/15/16 219 lb (99.3 kg)  05/29/16 216 lb (98 kg)  03/21/16 221 lb (100.2 kg)     Physical Exam Constitutional: He appears well-developed and well-nourished. No distress.  HENT:  Head: Normocephalic and atraumatic.  Right Ear: External ear normal.  Left Ear: External ear normal.  Mouth/Throat: Oropharynx is clear and moist.  Normal ear canals and TM b/l  Eyes: Conjunctivae and EOM are normal.  Neck: Neck supple. No tracheal deviation present. No thyromegaly present.  No  carotid bruit  Cardiovascular: Normal rate, regular rhythm, normal heart sounds and intact distal pulses.   No murmur heard. Pulmonary/Chest: Effort normal and breath sounds normal. No respiratory distress. He has no wheezes. He has no rales.  Abdominal: Soft. Bowel sounds are normal. He exhibits no distension. There is no tenderness.  Genitourinary: deferred to urology Musculoskeletal: He exhibits no edema.  Lymphadenopathy:   He has no cervical adenopathy.  Skin: Skin is warm and dry. He is not diaphoretic.  Psychiatric: He has a normal mood and affect. His behavior is normal.         Assessment & Plan:   Wellness Exam: Immunizations  Up to date  Colonoscopy  Up to date  Eye exam  Up to date  Hearing loss   Mild - does not want further evaluation at this time Memory concerns/difficulties  none Independent of ADLs  fully Stressed the importance of regular exercise   Patient received copy of preventative screening tests/immunizations recommended for the next 5-10 years.   Physical exam: Screening blood work  ordered Immunizations  Up to date  Colonoscopy  Up to date  Eye exams  Up to date  EKG  Last done 2014 Exercise  regularly Weight  BMI good for age Skin no concerns Substance abuse  - none  See Problem List for Assessment and Plan of chronic medical problems.   FU annually

## 2016-09-14 NOTE — Patient Instructions (Addendum)
[name] , Thank you for taking time to come for your Medicare Wellness Visit. I appreciate your ongoing commitment to your health goals. Please review the following plan we discussed and let me know if I can assist you in the future.   These are the goals we discussed: Goals    . Continue regular exercise                This is a list of the screening recommended for you and due dates:  Health Maintenance  Topic Date Due  . Flu Shot  12/17/2016  . Colon Cancer Screening  07/11/2020  . Tetanus Vaccine  10/01/2021  .  Hepatitis C: One time screening is recommended by Center for Disease Control  (CDC) for  adults born from [birth year] through 1967.   Completed  . Pneumonia vaccines  Completed    Test(s) ordered today. Your results will be released to [place] (or called to you) after review, usually within 72hours after test completion. If any changes need to be made, you will be notified at that same time.  All other Health Maintenance issues reviewed.   All recommended immunizations and age-appropriate screenings are up-to-date or discussed.  No immunizations administered today.   Medications reviewed and updated.   No changes recommended at this time.  Your prescription(s) have been submitted to your pharmacy. Please take as directed and contact our office if you believe you are having problem(s) with the medication(s).   Please followup in one year    Health Maintenance, Male A healthy lifestyle and preventive care is important for your health and wellness. Ask your health care provider about what schedule of regular examinations is right for you. What should I know about weight and diet?  Eat a Healthy Diet  Eat plenty of vegetables, fruits, whole grains, low-fat dairy products, and lean protein.  Do not eat a lot of foods high in solid fats, added sugars, or salt. Maintain a Healthy Weight  Regular exercise can help you achieve or maintain a healthy weight. You  should:  Do at least 150 minutes of exercise each week. The exercise should increase your heart rate and make you sweat (moderate-intensity exercise).  Do strength-training exercises at least twice a week. Watch Your Levels of Cholesterol and Blood Lipids  Have your blood tested for lipids and cholesterol every 5 years starting at 68 years of age. If you are at high risk for heart disease, you should start having your blood tested when you are 68 years old. You may need to have your cholesterol levels checked more often if:  Your lipid or cholesterol levels are high.  You are older than 68 years of age.  You are at high risk for heart disease. What should I know about cancer screening? Many types of cancers can be detected early and may often be prevented. Lung Cancer  You should be screened every year for lung cancer if:  You are a current smoker who has smoked for at least 30 years.  You are a former smoker who has quit within the past 15 years.  Talk to your health care provider about your screening options, when you should start screening, and how often you should be screened. Colorectal Cancer  Routine colorectal cancer screening usually begins at 68 years of age and should be repeated every 5-10 years until you are 68 years old. You may need to be screened more often if early forms of precancerous polyps or small  growths are found. Your health care provider may recommend screening at an earlier age if you have risk factors for colon cancer.  Your health care provider may recommend using home test kits to check for hidden blood in the stool.  A small camera at the end of a tube can be used to examine your colon (sigmoidoscopy or colonoscopy). This checks for the earliest forms of colorectal cancer. Prostate and Testicular Cancer  Depending on your age and overall health, your health care provider may do certain tests to screen for prostate and testicular cancer.  Talk to your  health care provider about any symptoms or concerns you have about testicular or prostate cancer. Skin Cancer  Check your skin from head to toe regularly.  Tell your health care provider about any new moles or changes in moles, especially if:  There is a change in a mole's size, shape, or color.  You have a mole that is larger than a pencil eraser.  Always use sunscreen. Apply sunscreen liberally and repeat throughout the day.  Protect yourself by wearing long sleeves, pants, a wide-brimmed hat, and sunglasses when outside. What should I know about heart disease, diabetes, and high blood pressure?  If you are 5-31 years of age, have your blood pressure checked every 3-5 years. If you are 21 years of age or older, have your blood pressure checked every year. You should have your blood pressure measured twice-once when you are at a hospital or clinic, and once when you are not at a hospital or clinic. Record the average of the two measurements. To check your blood pressure when you are not at a hospital or clinic, you can use:  An automated blood pressure machine at a pharmacy.  A home blood pressure monitor.  Talk to your health care provider about your target blood pressure.  If you are between 61-84 years old, ask your health care provider if you should take aspirin to prevent heart disease.  Have regular diabetes screenings by checking your fasting blood sugar level.  If you are at a normal weight and have a low risk for diabetes, have this test once every three years after the age of 13.  If you are overweight and have a high risk for diabetes, consider being tested at a younger age or more often.  A one-time screening for abdominal aortic aneurysm (AAA) by ultrasound is recommended for men aged 27-75 years who are current or former smokers. What should I know about preventing infection? Hepatitis B  If you have a higher risk for hepatitis B, you should be screened for this  virus. Talk with your health care provider to find out if you are at risk for hepatitis B infection. Hepatitis C  Blood testing is recommended for:  Everyone born from [birth year] through 1967.  Anyone with known risk factors for hepatitis C. Sexually Transmitted Diseases (STDs)  You should be screened each year for STDs including gonorrhea and chlamydia if:  You are sexually active and are younger than 68 years of age.  You are older than 68 years of age and your health care provider tells you that you are at risk for this type of infection.  Your sexual activity has changed since you were last screened and you are at an increased risk for chlamydia or gonorrhea. Ask your health care provider if you are at risk.  Talk with your health care provider about whether you are at high risk of being infected with  HIV. Your health care provider may recommend a prescription medicine to help prevent HIV infection. What else can I do?  Schedule regular health, dental, and eye exams.  Stay current with your vaccines (immunizations).  Do not use any tobacco products, such as cigarettes, chewing tobacco, and e-cigarettes. If you need help quitting, ask your health care provider.  Limit alcohol intake to no more than 2 drinks Ramey day. One drink equals 12 ounces of beer, 5 ounces of wine, or 1 ounces of hard liquor.  Do not use street drugs.  Do not share needles.  Ask your health care provider for help if you need support or information about quitting drugs.  Tell your health care provider if you often feel depressed.  Tell your health care provider if you have ever been abused or do not feel safe at home. This information is not intended to replace advice given to you by your health care provider. Make sure you discuss any questions you have with your health care provider. Document Released: 11/01/2007 Document Revised: 01/02/2016 Document Reviewed: 02/06/2015 Elsevier Interactive Patient  Education  2017 [employer].

## 2016-09-15 ENCOUNTER — Ambulatory Visit (INDEPENDENT_AMBULATORY_CARE_PROVIDER_SITE_OTHER): Payer: PPO | Admitting: Internal Medicine

## 2016-09-15 VITALS — BP 114/76 | HR 71 | Temp 98.0°F | Resp 16 | Ht 76.0 in | Wt 219.0 lb

## 2016-09-15 DIAGNOSIS — I10 Essential (primary) hypertension: Secondary | ICD-10-CM

## 2016-09-15 DIAGNOSIS — R972 Elevated prostate specific antigen [PSA]: Secondary | ICD-10-CM | POA: Diagnosis not present

## 2016-09-15 DIAGNOSIS — Z0000 Encounter for general adult medical examination without abnormal findings: Secondary | ICD-10-CM | POA: Diagnosis not present

## 2016-09-15 DIAGNOSIS — R739 Hyperglycemia, unspecified: Secondary | ICD-10-CM

## 2016-09-15 DIAGNOSIS — E782 Mixed hyperlipidemia: Secondary | ICD-10-CM | POA: Diagnosis not present

## 2016-09-15 MED ORDER — IRBESARTAN 150 MG PO TABS: 150.0000 mg | ORAL_TABLET | Freq: Every day | ORAL | 3 refills | Status: DC

## 2016-09-15 NOTE — Progress Notes (Signed)
Pre visit review using our clinic review tool, if applicable. No additional management support is needed unless otherwise documented below in the visit note. 

## 2016-09-15 NOTE — Assessment & Plan Note (Signed)
Check lipid panel  

## 2016-09-15 NOTE — Assessment & Plan Note (Signed)
BP well controlled Current regimen effective and well tolerated Continue current medications at current doses cmp,tsh 

## 2016-09-15 NOTE — Assessment & Plan Note (Signed)
Check a1c 

## 2016-09-15 NOTE — Assessment & Plan Note (Signed)
Following with Dr Alinda Money

## 2016-09-19 ENCOUNTER — Other Ambulatory Visit (INDEPENDENT_AMBULATORY_CARE_PROVIDER_SITE_OTHER): Payer: PPO

## 2016-09-19 DIAGNOSIS — R739 Hyperglycemia, unspecified: Secondary | ICD-10-CM | POA: Diagnosis not present

## 2016-09-19 DIAGNOSIS — I10 Essential (primary) hypertension: Secondary | ICD-10-CM

## 2016-09-19 DIAGNOSIS — Z0000 Encounter for general adult medical examination without abnormal findings: Secondary | ICD-10-CM

## 2016-09-19 LAB — COMPREHENSIVE METABOLIC PANEL
ALBUMIN: 4.3 g/dL (ref 3.5–5.2)
ALK PHOS: 51 U/L (ref 39–117)
ALT: 27 U/L (ref 0–53)
AST: 23 U/L (ref 0–37)
BILIRUBIN TOTAL: 0.5 mg/dL (ref 0.2–1.2)
BUN: 21 mg/dL (ref 6–23)
CALCIUM: 9.2 mg/dL (ref 8.4–10.5)
CO2: 25 mEq/L (ref 19–32)
Chloride: 106 mEq/L (ref 96–112)
Creatinine, Ser: 0.96 mg/dL (ref 0.40–1.50)
GFR: 82.78 mL/min (ref >60.00)
Glucose, Bld: 106 mg/dL — ABNORMAL HIGH (ref 70–99)
Potassium: 4.9 mEq/L (ref 3.5–5.1)
Sodium: 140 mEq/L (ref 135–145)
Total Protein: 6.8 g/dL (ref 6.0–8.3)

## 2016-09-19 LAB — LIPID PANEL
CHOL/HDL RATIO: 2
Cholesterol: 172 mg/dL (ref 0–200)
HDL: 73.1 mg/dL (ref >39.00)
LDL CALC: 88 mg/dL (ref 0–99)
NONHDL: 99.1
Triglycerides: 54 mg/dL (ref 0.0–149.0)
VLDL: 10.8 mg/dL (ref 0.0–40.0)

## 2016-09-19 LAB — CBC WITH DIFFERENTIAL/PLATELET
BASOS ABS: 0 10*3/uL (ref 0.0–0.1)
Basophils Relative: 1 % (ref 0.0–3.0)
EOS ABS: 0 10*3/uL (ref 0.0–0.7)
Eosinophils Relative: 0.8 % (ref 0.0–5.0)
HEMATOCRIT: 42.6 % (ref 39.0–52.0)
HEMOGLOBIN: 14.6 g/dL (ref 13.0–17.0)
LYMPHS PCT: 44.2 % (ref 12.0–46.0)
Lymphs Abs: 2.1 10*3/uL (ref 0.7–4.0)
MCHC: 34.2 g/dL (ref 30.0–36.0)
MCV: 91 fl (ref 78.0–100.0)
MONOS PCT: 8.9 % (ref 3.0–12.0)
Monocytes Absolute: 0.4 10*3/uL (ref 0.1–1.0)
Neutro Abs: 2.2 10*3/uL (ref 1.4–7.7)
Neutrophils Relative %: 45.1 % (ref 43.0–77.0)
PLATELETS: 226 10*3/uL (ref 150.0–400.0)
RBC: 4.68 Mil/uL (ref 4.22–5.81)
RDW: 14 % (ref 11.5–15.5)
WBC: 4.9 10*3/uL (ref 4.0–10.5)

## 2016-09-19 LAB — TSH: TSH: 1.72 u[IU]/mL (ref 0.35–4.50)

## 2016-09-19 LAB — HEMOGLOBIN A1C: Hgb A1c MFr Bld: 5.9 % (ref 4.6–6.5)

## 2016-09-20 ENCOUNTER — Encounter: Payer: Self-pay | Admitting: Internal Medicine

## 2016-09-20 DIAGNOSIS — R7303 Prediabetes: Secondary | ICD-10-CM | POA: Insufficient documentation

## 2016-09-30 DIAGNOSIS — H47292 Other optic atrophy, left eye: Secondary | ICD-10-CM | POA: Diagnosis not present

## 2017-01-02 ENCOUNTER — Encounter: Payer: Self-pay | Admitting: Family Medicine

## 2017-01-02 ENCOUNTER — Ambulatory Visit: Payer: Self-pay

## 2017-01-02 ENCOUNTER — Ambulatory Visit (INDEPENDENT_AMBULATORY_CARE_PROVIDER_SITE_OTHER): Payer: PPO | Admitting: Family Medicine

## 2017-01-02 ENCOUNTER — Ambulatory Visit (INDEPENDENT_AMBULATORY_CARE_PROVIDER_SITE_OTHER)
Admission: RE | Admit: 2017-01-02 | Discharge: 2017-01-02 | Disposition: A | Discharge status: Home / Self Care | Payer: PPO | Source: Ambulatory Visit | Attending: Family Medicine | Admitting: Family Medicine

## 2017-01-02 VITALS — BP 132/82 | HR 66 | Ht 76.0 in | Wt 226.0 lb

## 2017-01-02 DIAGNOSIS — G8929 Other chronic pain: Secondary | ICD-10-CM | POA: Diagnosis not present

## 2017-01-02 DIAGNOSIS — M7061 Trochanteric bursitis, right hip: Secondary | ICD-10-CM

## 2017-01-02 DIAGNOSIS — M25551 Pain in right hip: Secondary | ICD-10-CM | POA: Diagnosis not present

## 2017-01-02 DIAGNOSIS — M75101 Unspecified rotator cuff tear or rupture of right shoulder, not specified as traumatic: Secondary | ICD-10-CM

## 2017-01-02 DIAGNOSIS — M25511 Pain in right shoulder: Secondary | ICD-10-CM

## 2017-01-02 NOTE — Assessment & Plan Note (Signed)
Patient does have more of a greater trochanter bursitis. Discussed with patient at great length about icing regimen, home exercises, which activities to do an which was to avoid. Patient given topical anti-inflammatories, work with Product/process development scientist to learn home exercises in greater detail. X-rays ordered today because patient having some mild decrease in internal range of motion. Follow-up again in 4 weeks. Consider injection and possibly physical therapy at follow-up.

## 2017-01-02 NOTE — Progress Notes (Signed)
[name] [place], [place] [address] Phone: [phone] Subjective:    CC: Right shoulder pain, hip pain  XAJ:[id number]  [name] [name] is a 66 y.o. male coming in with complaint of right shoulder pain. He has been seen for this same issue before. No new injury but the pain has come back in the right shoulder. Patient was found to have a shoulder bursitis and did respond very well to injection and home exercises. Patient did travel all summer. Started having worsening pain again. Noticing some mild limitation in range of motion. Anything seems to be worsening over the course of time.  New problem. He is also having right hip pain. He has been trying various exercises to stretch that area. He does note pain when he lies on the right side.   Hip: Onset-3 months Location- right hip Duration- intermittent Character- stinging Aggravating factors-driving Reliving factors-  Therapies tried- stretching Severity-0/10     Past Medical History:  Diagnosis Date  . Benign prostatic hypertrophy with elevated PSA    Dr Alinda Money, High grade PIN w/o malignancy  . History of colonic polyps 2003, 2007   hyperplastic  . Hx of colonoscopy 2012   neg  . Hyperlipidemia   . Hypertension   . Retinal vascular occlusion    Dr Delman Cheadle   Past Surgical History:  Procedure Laterality Date  . APPENDECTOMY    . COLONOSCOPY W/ POLYPECTOMY      X2 , Dr Deatra Ina  . HERNIA REPAIR  2009,2011    Dr Margot Chimes  . lichenoid keratosis resected  8/14  . LIPOMA EXCISION     X 5  . PROSTATE BIOPSY  09/2010   Dr Alinda Money   Social History   Social History  . Marital status: Married    Spouse name: N/A  . Number of children: N/A  . Years of education: N/A   Social History Main Topics  . Smoking status: Never Smoker  . Smokeless tobacco: Never Used  . Alcohol use 12.6 oz/week    21 Glasses of wine Hendrick week     Comment:  3 glasses of wine  . Drug use: No  . Sexual  activity: Not Asked   Other Topics Concern  . None   Social History Narrative  . None   No Known Allergies Family History  Problem Relation Age of Onset  . Alzheimer's disease Mother   . Heart attack Father 2  . Hypertension Brother   . Hypertension Maternal Grandmother   . Diabetes Neg Hx   . Stroke Neg Hx      Past medical history, social, surgical and family history all reviewed in electronic medical record.  No pertanent information unless stated regarding to the chief complaint.   Review of Systems:Review of systems updated and as accurate as of 01/02/17  No headache, visual changes, nausea, vomiting, diarrhea, constipation, dizziness, abdominal pain, skin rash, fevers, chills, night sweats, weight loss, swollen lymph nodes, body aches, joint swelling, muscle aches, chest pain, shortness of breath, mood changes.   Objective  Blood pressure 132/82, pulse 66, height 6\' 4"  (1.93 m), weight 226 lb (102.5 kg). Systems examined below as of 01/02/17   General: No apparent distress alert and oriented x3 mood and affect normal, dressed appropriately.  HEENT: Pupils equal, extraocular movements intact  Respiratory: Patient's speak in full sentences and does not appear short of breath  Cardiovascular: No lower extremity edema, non tender, no erythema  Skin:  Warm dry intact with no signs of infection or rash on extremities or on axial skeleton.  Abdomen: Soft nontender  Neuro: Cranial nerves II through XII are intact, neurovascularly intact in all extremities with 2+ DTRs and 2+ pulses.  Lymph: No lymphadenopathy of posterior or anterior cervical chain or axillae bilaterally.  Gait normal with good balance and coordination.  MSK:  Non tender with full range of motion and good stability and symmetric strength and tone of  elbows, wrist,  knee and ankles bilaterally.  Shoulder: Right Inspection reveals no abnormalities, atrophy or asymmetry. Palpation is normal with no tenderness  over AC joint or bicipital groove. Very mild lack of external rotation Rotator cuff strength normal throughout. signs of impingement with positive Neer and Hawkin's tests, but negative empty can sign. Speeds and Yergason's tests normal. No labral pathology noted with negative Obrien's, negative clunk and good stability. Normal scapular function observed. No painful arc and no drop arm sign. No apprehension sign  Hip: Right ROM IR: 15 Deg, ER: 25 Deg, Flexion: 120 Deg, Extension: 100 Deg, Abduction: 45 Deg, Adduction: 25 Deg Strength IR: 5/5, ER: 5/5, Flexion: 5/5, Extension: 5/5, Abduction: 5/5, Adduction: 5/5 Pelvic alignment unremarkable to inspection and palpation. Standing hip rotation and gait without trendelenburg sign / unsteadiness. Tender over the greater trochanter area. Patient also has some pain with internal rotation of the hip No SI joint tenderness and normal minimal SI movement. Contralateral hip has increasing range of motion and non-tenderness  Procedure: Real-time Ultrasound Guided Injection of right glenohumeral joint Device: GE Logiq E  Ultrasound guided injection is preferred based studies that show increased duration, increased effect, greater accuracy, decreased procedural pain, increased response rate with ultrasound guided versus blind injection.  Verbal informed consent obtained.  Time-out conducted.  Noted no overlying erythema, induration, or other signs of local infection.  Skin prepped in a sterile fashion.  Local anesthesia: Topical Ethyl chloride.  With sterile technique and under real time ultrasound guidance:  Joint visualized.  23g 1  inch needle inserted posterior approach. Pictures taken for needle placement. Patient did have injection of 2 cc of 1% lidocaine, 2 cc of 0.5% Marcaine, and 1.0 cc of Kenalog 40 mg/dL. Completed without difficulty  Pain immediately resolved suggesting accurate placement of the medication.  Advised to call if  fevers/chills, erythema, induration, drainage, or persistent bleeding.  Images permanently stored and available for review in the ultrasound unit.  Impression: Technically successful ultrasound guided injection.  [id number]; 15 additional minutes spent for Therapeutic exercises as stated in above notes.  This included exercises focusing on stretching, strengthening, with significant focus on eccentric aspects.   Long term goals include an improvement in range of motion, strength, endurance as well as avoiding reinjury. Patient's frequency would include in 1-2 times a day, 3-5 times a week for a duration of 6-12 weeks.  Hip strengthening exercises which included:  Pelvic tilt/bracing to help with proper recruitment of the lower abs and pelvic floor muscles  Glute strengthening to properly contract glutes without over-engaging low back and hamstrings - prone hip extension and glute bridge exercises Proper stretching techniques to increase effectiveness for the hip flexors, groin, quads, piriformic and low back when appropriate  Thereband given today  Proper technique shown and discussed handout in great detail with ATC.  All questions were discussed and answered.     Impression and Recommendations:     This case required medical decision making of moderate complexity.      Note:  This dictation was prepared with Dragon dictation along with smaller phrase technology. Any transcriptional errors that result from this process are unintentional.

## 2017-01-02 NOTE — Patient Instructions (Addendum)
Great to see you  Alvera Singh is your friend.  COnitnue the nitro for the shoulder You know the drill for the shoulder Lets do get xray for the shoulder today as well.  For the hip  Exercises on wall.  Heel and butt touching.  Raise leg 6 inches and hold 2 seconds.  Down slow for count of 4 seconds.  1 set of 30 reps daily on both sides.  Exercises 3 times a week.  pennsaid pinkie amount topically 2 times daily as needed.  See me again in 4 weeks.

## 2017-01-02 NOTE — Assessment & Plan Note (Addendum)
Patient given another injection at this time. Patient has now had 3 in over 12 months. Discussed with patient and x-rays are needed. Encouraged patient continue home exercises. Patient has worsening symptoms we'll need to consider advanced imaging or formal physical therapy again. Patient is in agreement. Follow up again in 4 weeks encouraged to continue the nitroglycerin glycerin patches.

## 2017-01-21 DIAGNOSIS — L814 Other melanin hyperpigmentation: Secondary | ICD-10-CM | POA: Diagnosis not present

## 2017-01-21 DIAGNOSIS — D2272 Melanocytic nevi of left lower limb, including hip: Secondary | ICD-10-CM | POA: Diagnosis not present

## 2017-01-21 DIAGNOSIS — D225 Melanocytic nevi of trunk: Secondary | ICD-10-CM | POA: Diagnosis not present

## 2017-01-21 DIAGNOSIS — C44519 Basal cell carcinoma of skin of other part of trunk: Secondary | ICD-10-CM | POA: Diagnosis not present

## 2017-01-21 DIAGNOSIS — L821 Other seborrheic keratosis: Secondary | ICD-10-CM | POA: Diagnosis not present

## 2017-01-21 DIAGNOSIS — D485 Neoplasm of uncertain behavior of skin: Secondary | ICD-10-CM | POA: Diagnosis not present

## 2017-01-21 DIAGNOSIS — L111 Transient acantholytic dermatosis [Grover]: Secondary | ICD-10-CM | POA: Diagnosis not present

## 2017-01-21 DIAGNOSIS — D2271 Melanocytic nevi of right lower limb, including hip: Secondary | ICD-10-CM | POA: Diagnosis not present

## 2017-01-21 DIAGNOSIS — D2261 Melanocytic nevi of right upper limb, including shoulder: Secondary | ICD-10-CM | POA: Diagnosis not present

## 2017-01-21 DIAGNOSIS — D1721 Benign lipomatous neoplasm of skin and subcutaneous tissue of right arm: Secondary | ICD-10-CM | POA: Diagnosis not present

## 2017-03-13 ENCOUNTER — Ambulatory Visit (INDEPENDENT_AMBULATORY_CARE_PROVIDER_SITE_OTHER): Payer: PPO | Admitting: Family Medicine

## 2017-03-13 ENCOUNTER — Ambulatory Visit: Payer: Self-pay | Admitting: Family Medicine

## 2017-03-13 ENCOUNTER — Ambulatory Visit: Payer: Self-pay

## 2017-03-13 ENCOUNTER — Encounter: Payer: Self-pay | Admitting: Family Medicine

## 2017-03-13 VITALS — BP 128/90 | HR 59 | Ht 76.0 in | Wt 226.0 lb

## 2017-03-13 DIAGNOSIS — M25559 Pain in unspecified hip: Secondary | ICD-10-CM

## 2017-03-13 DIAGNOSIS — M7061 Trochanteric bursitis, right hip: Secondary | ICD-10-CM

## 2017-03-13 NOTE — Assessment & Plan Note (Addendum)
No improvement bursa worsening symptoms. Patient given injection. Tolerated the procedure well. We discussed icing regimen, topical anti-inflammatories again. Patient will continue to be active. Follow-up with me again in 4-6 weeks.

## 2017-03-13 NOTE — Patient Instructions (Signed)
Good to see you  Sorry again I hope this helps Exercises on wall.  Heel and butt touching.  Raise leg 6 inches and hold 2 seconds.  Down slow for count of 4 seconds.  1 set of 30 reps daily on both sides.  Exercises 3 times a week.  See me again in 6 weeks.

## 2017-03-13 NOTE — Progress Notes (Signed)
[name] [address], [place] [address] Phone: [phone] Subjective:    I'm seeing this patient by the request  of:    CC: Right hip pain  [id number]  [name] [name] is a 66 y.o. male coming in with complaint of right hip pain. Patient was found to have more of a greater trochanteric bursitis and electing trying to do home exercises. Patient states that only very mild improvement. Worsening symptoms overall. Waking him up at night. Seems to be worse after being sedentary even gets a little better with activity. Denies any radiatio  down the leg.    Past Medical History:  Diagnosis Date  . Benign prostatic hypertrophy with elevated PSA    Dr Alinda Money, High grade PIN w/o malignancy  . History of colonic polyps 2003, 2007   hyperplastic  . Hx of colonoscopy 2012   neg  . Hyperlipidemia   . Hypertension   . Retinal vascular occlusion    Dr Delman Cheadle   Past Surgical History:  Procedure Laterality Date  . APPENDECTOMY    . COLONOSCOPY W/ POLYPECTOMY      X2 , Dr Deatra Ina  . HERNIA REPAIR  2009,2011    Dr Margot Chimes  . lichenoid keratosis resected  8/14  . LIPOMA EXCISION     X 5  . PROSTATE BIOPSY  09/2010   Dr Alinda Money   Social History   Social History  . Marital status: Married    Spouse name: N/A  . Number of children: N/A  . Years of education: N/A   Social History Main Topics  . Smoking status: Never Smoker  . Smokeless tobacco: Never Used  . Alcohol use 12.6 oz/week    21 Glasses of wine Stevan week     Comment:  3 glasses of wine  . Drug use: No  . Sexual activity: Not Asked   Other Topics Concern  . None   Social History Narrative  . None   No Known Allergies Family History  Problem Relation Age of Onset  . Alzheimer's disease Mother   . Heart attack Father 30  . Hypertension Brother   . Hypertension Maternal Grandmother   . Diabetes Neg Hx   . Stroke Neg Hx      Past medical history, social, surgical and  family history all reviewed in electronic medical record.  No pertanent information unless stated regarding to the chief complaint.   Review of Systems:Review of systems updated and as accurate as of 03/13/17  No headache, visual changes, nausea, vomiting, diarrhea, constipation, dizziness, abdominal pain, skin rash, fevers, chills, night sweats, weight loss, swollen lymph nodes, body aches, joint swelling, muscle aches, chest pain, shortness of breath, mood changes.   Objective  Blood pressure 128/90, pulse (!) 59, height 6\' 4"  (1.93 m), weight 226 lb (102.5 kg), SpO2 93 %. Systems examined below as of 03/13/17   General: No apparent distress alert and oriented x3 mood and affect normal, dressed appropriately.  HEENT: Pupils equal, extraocular movements intact  Respiratory: Patient's speak in full sentences and does not appear short of breath  Cardiovascular: No lower extremity edema, non tender, no erythema  Skin: Warm dry intact with no signs of infection or rash on extremities or on axial skeleton.  Abdomen: Soft nontender  Neuro: Cranial nerves II through XII are intact, neurovascularly intact in all extremities with 2+ DTRs and 2+ pulses.  Lymph: No lymphadenopathy of posterior or anterior cervical chain or axillae bilaterally.  Gait normal with good balance and coordination.  MSK:  Non tender with full range of motion and good stability and symmetric strength and tone of shoulders, elbows, wrist, knee and ankles bilaterally. Continued mild impingement on the right side.  Right hip has more pain on the lateral aspect of the hip that is severely tender to palpation. Patient has a negative straight leg test. Mild positive Corky Sox test. Patient has good internal rotation. Neurovascular intact distally with full strength. Contralateral hip unremarkable   Procedure: Real-time Ultrasound Guided Injection of right greater trochanteric bursitis secondary to patient's body habitus Device: GE Logiq  Q7 Ultrasound guided injection is preferred based studies that show increased duration, increased effect, greater accuracy, decreased procedural pain, increased response rate, and decreased cost with ultrasound guided versus blind injection.  Verbal informed consent obtained.  Time-out conducted.  Noted no overlying erythema, induration, or other signs of local infection.  Skin prepped in a sterile fashion.  Local anesthesia: Topical Ethyl chloride.  With sterile technique and under real time ultrasound guidance:  Greater trochanteric area was visualized and patient's bursa was noted. A 22-gauge 3 inch needle was inserted and 4 cc of 0.5% Marcaine and 1 cc of Kenalog 40 mg/dL was injected. Pictures taken Completed without difficulty  Pain immediately resolved suggesting accurate placement of the medication.  Advised to call if fevers/chills, erythema, induration, drainage, or persistent bleeding.  Images permanently stored and available for review in the ultrasound unit.  Impression: Technically successful ultrasound guided injection.     Impression and Recommendations:     This case required medical decision making of moderate complexity.      Note: This dictation was prepared with Dragon dictation along with smaller phrase technology. Any transcriptional errors that result from this process are unintentional.

## 2017-03-20 DIAGNOSIS — N5201 Erectile dysfunction due to arterial insufficiency: Secondary | ICD-10-CM | POA: Diagnosis not present

## 2017-03-20 DIAGNOSIS — N401 Enlarged prostate with lower urinary tract symptoms: Secondary | ICD-10-CM | POA: Diagnosis not present

## 2017-03-20 DIAGNOSIS — R3912 Poor urinary stream: Secondary | ICD-10-CM | POA: Diagnosis not present

## 2017-03-20 DIAGNOSIS — R972 Elevated prostate specific antigen [PSA]: Secondary | ICD-10-CM | POA: Diagnosis not present

## 2017-03-24 ENCOUNTER — Encounter: Payer: Self-pay | Admitting: Internal Medicine

## 2017-03-24 ENCOUNTER — Ambulatory Visit: Payer: PPO | Admitting: Internal Medicine

## 2017-03-24 VITALS — BP 124/84 | HR 88 | Temp 99.1°F | Resp 16 | Wt 218.0 lb

## 2017-03-24 DIAGNOSIS — I82812 Embolism and thrombosis of superficial veins of left lower extremities: Secondary | ICD-10-CM

## 2017-03-24 DIAGNOSIS — I83892 Varicose veins of left lower extremities with other complications: Secondary | ICD-10-CM | POA: Insufficient documentation

## 2017-03-24 DIAGNOSIS — I8312 Varicose veins of left lower extremity with inflammation: Secondary | ICD-10-CM | POA: Diagnosis not present

## 2017-03-24 NOTE — Progress Notes (Signed)
Subjective:    Patient ID: [name] [name], male    DOB: [birth year], 66 y.o.   MRN: [id number]  HPI He is here for an acute visit.   LLE vein pain:  He has a history of varicose veins that have been present for years.  He has had thrombophlebitis in the past.  He denies pain on a regular basis over the years, but the past several months has started to have pain.  The pain is located in his left leg along the vein.  He wears compression socks day.  He gets swelling, but it has not gotten worse.  As long as he wears the compression socks his swelling is controlled.  He denies any redness or warmth in the leg.  He does take a baby aspirin daily.  He was unsure if he needed to have something done with the vein at this point or not.  Medications and allergies reviewed with patient and updated if appropriate.  Patient Active Problem List   Diagnosis Date Noted  . Greater trochanteric bursitis of right hip 01/02/2017  . Prediabetes 09/20/2016  . Rotator cuff syndrome of right shoulder 09/27/2015  . Vitamin D deficiency 09/07/2014  . Lichenoid keratosis 62/95/2841  . Myalgia and myositis 09/01/2013  . Superficial thrombosis of leg 03/28/2013  . 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 History of colonic polyps 05/15/2008  . Essential hypertension 05/15/2008  . HYPERLIPIDEMIA 05/12/2007  . Elevated prostate specific antigen (PSA) 05/12/2007    Current Outpatient Medications on File Prior to Visit  Medication Sig Dispense Refill  . aspirin 81 MG tablet Take 81 mg by mouth daily.    . Cholecalciferol (VITAMIN D3) 2000 units TABS Take by mouth.    . fish oil-omega-3 fatty acids 1000 MG capsule Take 2 g by mouth daily.     . irbesartan (AVAPRO) 150 MG tablet Take 1 tablet (150 mg total) by mouth daily. 90 tablet 3  . MAGNESIUM CITRATE PO Take 400 mg by mouth daily.    . nitroGLYCERIN (NITRODUR - DOSED IN MG/24 HR) 0.2 mg/hr patch 1/4 patch daily 30 patch 1   No current facility-administered  medications on file prior to visit.     Past Medical History:  Diagnosis Date  . Benign prostatic hypertrophy with elevated PSA    Dr Alinda Money, High grade PIN w/o malignancy  . History of colonic polyps 2003, 2007   hyperplastic  . Hx of colonoscopy 2012   neg  . Hyperlipidemia   . Hypertension   . Retinal vascular occlusion    Dr Delman Cheadle    Past Surgical History:  Procedure Laterality Date  . APPENDECTOMY    . COLONOSCOPY W/ POLYPECTOMY      X2 , Dr Deatra Ina  . HERNIA REPAIR  2009,2011    Dr Margot Chimes  . lichenoid keratosis resected  8/14  . LIPOMA EXCISION     X 5  . PROSTATE BIOPSY  09/2010   Dr Alinda Money    Social History   Socioeconomic History  . Marital status: Married    Spouse name: None  . Number of children: None  . Years of education: None  . Highest education level: None  Social Needs  . Financial resource strain: None  . Food insecurity - worry: None  . Food insecurity - inability: None  . Transportation needs - medical: None  . Transportation needs - non-medical: None  Occupational History  . None  Tobacco Use  . Smoking status: Never Smoker  .  Smokeless tobacco: Never Used  Substance and Sexual Activity  . Alcohol use: Yes    Alcohol/week: 12.6 oz    Types: 21 Glasses of wine Amanda week    Comment:  3 glasses of wine  . Drug use: No  . Sexual activity: None  Other Topics Concern  . None  Social History Narrative  . None    Family History  Problem Relation Age of Onset  . Alzheimer's disease Mother   . Heart attack Father 64  . Hypertension Brother   . Hypertension Maternal Grandmother   . Diabetes Neg Hx   . Stroke Neg Hx     Review of Systems  Constitutional: Negative for chills and fever.  Cardiovascular: Positive for leg swelling (Controlled with compression socks).  Skin: Negative for color change, rash and wound.       Tenderness along vein left leg       Objective:   Vitals:   03/24/17 1035  BP: 124/84  Pulse: 88  Resp: 16    Temp: 99.1 F (37.3 C)  SpO2: 97%   Filed Weights   03/24/17 1035  Weight: 218 lb (98.9 kg)   Body mass index is 26.54 kg/m.  Wt Readings from Last 3 Encounters:  03/24/17 218 lb (98.9 kg)  03/13/17 226 lb (102.5 kg)  01/02/17 226 lb (102.5 kg)     Physical Exam  Constitutional: He appears well-developed and well-nourished.  HENT:  Head: Normocephalic and atraumatic.  Cardiovascular: Intact distal pulses.  Left lower extremity with superficial varicose veins, tenderness along one vein in the medial aspect of the upper lower leg and mid lower leg; possible superficial clot in upper portion of the vein (proximal lower leg)  Skin: Skin is warm and dry. No rash noted. He is not diaphoretic. No erythema.  No wounds or ulcers          Assessment & Plan:   See Problem List for Assessment and Plan of chronic medical problems.

## 2017-03-24 NOTE — Patient Instructions (Signed)
Take aleve 2 tabs twice daily or advil 600 mg three times daily with food for one week.    Apply warm compresses to the vein.    A referral was ordered for vascular surgery.

## 2017-03-24 NOTE — Assessment & Plan Note (Signed)
Several varicose veins lower extremities Up until now his symptoms have been controlled with compression socks-he has had minimal swelling and no significant pain

## 2017-03-24 NOTE — Assessment & Plan Note (Signed)
He has had increased pain recently and there is a palpable superficial clot, which he feels is new.  He states he has had phlebitis in the past Continue baby aspirin Add Aleve or Advil 2-3 times daily with food for at least 1 week Apply warm compresses Continue compression socks Will refer to vascular We will hold off on imaging since this appears to be a superficial clot possibly, but if symptoms do not improve we will order an ultrasound, but expect vascular will likely want to image the leg

## 2017-03-27 ENCOUNTER — Encounter: Payer: Self-pay | Admitting: Internal Medicine

## 2017-04-03 ENCOUNTER — Other Ambulatory Visit: Payer: Self-pay

## 2017-04-03 DIAGNOSIS — I82812 Embolism and thrombosis of superficial veins of left lower extremities: Secondary | ICD-10-CM

## 2017-04-03 DIAGNOSIS — I8312 Varicose veins of left lower extremity with inflammation: Secondary | ICD-10-CM

## 2017-05-06 ENCOUNTER — Encounter (HOSPITAL_COMMUNITY): Payer: PPO

## 2017-05-06 ENCOUNTER — Encounter: Payer: PPO | Admitting: Vascular Surgery

## 2017-06-09 ENCOUNTER — Ambulatory Visit (INDEPENDENT_AMBULATORY_CARE_PROVIDER_SITE_OTHER): Payer: PPO | Admitting: Vascular Surgery

## 2017-06-09 ENCOUNTER — Ambulatory Visit (HOSPITAL_COMMUNITY)
Admission: RE | Admit: 2017-06-09 | Discharge: 2017-06-09 | Disposition: A | Discharge status: Home / Self Care | Payer: PPO | Source: Ambulatory Visit | Attending: Vascular Surgery | Admitting: Vascular Surgery

## 2017-06-09 ENCOUNTER — Encounter: Payer: Self-pay | Admitting: Vascular Surgery

## 2017-06-09 VITALS — BP 147/86 | HR 64 | Temp 97.3°F | Resp 18 | Ht 76.5 in | Wt 226.0 lb

## 2017-06-09 DIAGNOSIS — I83892 Varicose veins of left lower extremities with other complications: Secondary | ICD-10-CM

## 2017-06-09 DIAGNOSIS — I8312 Varicose veins of left lower extremity with inflammation: Secondary | ICD-10-CM | POA: Diagnosis not present

## 2017-06-09 DIAGNOSIS — I82812 Embolism and thrombosis of superficial veins of left lower extremities: Secondary | ICD-10-CM | POA: Insufficient documentation

## 2017-06-09 NOTE — Progress Notes (Signed)
Subjective:     Patient ID: [name] [name], male   DOB: 23-Nov-1950, 67 y.o.   MRN: [id number]  HPI This 67 year old male was referred by Dr. Quay Burow for evaluation of varicose veins in the left leg. The patient has a remote history of thrombophlebitis in the left leg in 2014 which resolved with anti-inflammatory medications. 2 months ago he developed pain in the left medial calf area where varicosities are located. He treated this again with ibuprofen and heat and this has gradually resolved. He has some mild edema but does not were elastic compression stockings on a regular basis. He has no history of DVT stasis ulcers or bleeding. He does develop heaviness and aching as the day progresses in the left calf.  Past Medical History:  Diagnosis Date  . Benign prostatic hypertrophy with elevated PSA    Dr Alinda Money, High grade PIN w/o malignancy  . History of colonic polyps 2003, 2007   hyperplastic  . Hx of colonoscopy 2012   neg  . Hyperlipidemia   . Hypertension   . Retinal vascular occlusion    Dr Delman Cheadle    Social History   Tobacco Use  . Smoking status: Never Smoker  . Smokeless tobacco: Never Used  Substance Use Topics  . Alcohol use: Yes    Alcohol/week: 12.6 oz    Types: 21 Glasses of wine Cheick week    Comment:  3 glasses of wine    Family History  Problem Relation Age of Onset  . Alzheimer's disease Mother   . Heart attack Father 90  . Hypertension Brother   . Hypertension Maternal Grandmother   . Diabetes Neg Hx   . Stroke Neg Hx     No Known Allergies   Current Outpatient Medications:  .  aspirin 81 MG tablet, Take 81 mg by mouth daily., Disp: , Rfl:  .  Cholecalciferol (VITAMIN D3) 2000 units TABS, Take by mouth., Disp: , Rfl:  .  finasteride (PROSCAR) 5 MG tablet, Take 5 mg by mouth daily., Disp: , Rfl:  .  irbesartan (AVAPRO) 150 MG tablet, Take 1 tablet (150 mg total) by mouth daily., Disp: 90 tablet, Rfl: 3 .  nitroGLYCERIN (NITRODUR - DOSED IN MG/24 HR) 0.2  mg/hr patch, 1/4 patch daily, Disp: 30 patch, Rfl: 1 .  fish oil-omega-3 fatty acids 1000 MG capsule, Take 2 g by mouth daily. , Disp: , Rfl:  .  MAGNESIUM CITRATE PO, Take 400 mg by mouth daily., Disp: , Rfl:   Vitals:   06/09/17 1310  BP: (!) 147/86  Pulse: 64  Resp: 18  Temp: (!) 97.3 F (36.3 C)  TempSrc: Oral  Weight: 226 lb (102.5 kg)  Height: 6' 4.5" (1.943 m)    Body mass index is 27.15 kg/m.         Review of Systems Denies chest pain, dyspnea on exertion, PND, orthopnea, hemoptysis, claudication. Has history of BPH, retinal artery occlusion.    Objective:   Physical Exam BP (!) 147/86 (BP Location: Left Arm, Patient Position: Sitting, Cuff Size: Normal)   Pulse 64   Temp (!) 97.3 F (36.3 C) (Oral)   Resp 18   Ht 6' 4.5" (1.943 m)   Wt 226 lb (102.5 kg)   BMI 27.15 kg/m     Gen.-alert and oriented x3 in no apparent distress HEENT normal for age Lungs no rhonchi or wheezing Cardiovascular regular rhythm no murmurs carotid pulses 3+ palpable no bruits audible Abdomen soft nontender no palpable masses  Musculoskeletal free of  major deformities Skin clear -no rashes Neurologic normal Lower extremities 3+ femoral and dorsalis pedis pulses palpable bilaterally with no edema on the right 1+ edema on the left Bulging varicosities in the left medial distal thigh and medial calf extending posteriorly and down onto the dorsum of the foot. Very early hyperpigmentation lower third left leg with no active ulcer.  Today I ordered a venous duplex exam the left leg which I reviewed and interpreted. There is no DVT. There is gross reflux throughout large left great saphenous vein supplying these painful varicosities.  I performed a bedside sono site ultrasound exam today confirming the above findings       Assessment:     #1 painful varicosities left leg due to gross reflux left great saphenous vein with 2 previous episodes of thrombophlebitis and varicosities  left medial calf most recently 2 months ago. These are causing symptoms which are affecting patient's daily living  #2 BPH #3 history retinal artery occlusion Exline #4 hypertension #5 hyperlipidemia    Plan:         #1 long leg elastic compression stockings 20-30 mm gradient #2 elevate legs as much as possible #3 ibuprofen daily on a regular basis for pain #4 return in 3 months-if no significant improvement then he will need laser ablation left great saphenous vein followed by three-month waiting period and then be evaluated for possible stab phlebectomy of multiple varicosities Return in 3 months

## 2017-06-10 ENCOUNTER — Encounter: Payer: Self-pay | Admitting: Internal Medicine

## 2017-06-10 ENCOUNTER — Ambulatory Visit (INDEPENDENT_AMBULATORY_CARE_PROVIDER_SITE_OTHER): Payer: PPO | Admitting: Internal Medicine

## 2017-06-10 DIAGNOSIS — K4091 Unilateral inguinal hernia, without obstruction or gangrene, recurrent: Secondary | ICD-10-CM | POA: Diagnosis not present

## 2017-06-10 NOTE — Assessment & Plan Note (Signed)
Experiencing left inguinal/suprapubic discomfort, 4/10 Concern for possible recurrent inguinal hernia.  He had surgery approximately 10 years ago Will refer to surgery for further evaluation and treatment He will call if his symptoms change prior to seeing surgery

## 2017-06-10 NOTE — Patient Instructions (Signed)
A referral was ordered for surgery.  They will call you to schedule an appointment.

## 2017-06-10 NOTE — Progress Notes (Signed)
Subjective:    Patient ID: [name] [name], male    DOB: 03-Jul-1950, 67 y.o.   MRN: [id number]  HPI He is here for an acute visit.    Left inguinal discomfort:  He had a hernia in his left groin about 10 years ago. He had surgery and mesh placed.  He has always had some discomfort - if sitting for long periods of time.  Recently the pain has increased.  He denies a bulge.  No increased pain with coughing, valsalva.  Lifting up his testicle can improve the pain.  He denies swelling and redness.  The pain does radiate into the testicle slightly.  He denies any testicular swelling or discomfort.  He does follow with urology routinely for his prostate.  He denies any abdominal pain, nausea, fevers or chills.   Medications and allergies reviewed with patient and updated if appropriate.  Patient Active Problem List   Diagnosis Date Noted  . Varicose veins of left lower extremity with complications 16/38/4665  . Greater trochanteric bursitis of right hip 01/02/2017  . Prediabetes 09/20/2016  . Rotator cuff syndrome of right shoulder 09/27/2015  . Vitamin D deficiency 09/07/2014  . Lichenoid keratosis 99/35/7017  . Myalgia and myositis 09/01/2013  . Superficial thrombosis of leg 03/28/2013  . 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 History of colonic polyps 05/15/2008  . Essential hypertension 05/15/2008  . HYPERLIPIDEMIA 05/12/2007  . Elevated prostate specific antigen (PSA) 05/12/2007    Current Outpatient Medications on File Prior to Visit  Medication Sig Dispense Refill  . aspirin 81 MG tablet Take 81 mg by mouth daily.    . Cholecalciferol (VITAMIN D3) 2000 units TABS Take by mouth.    . finasteride (PROSCAR) 5 MG tablet Take 5 mg by mouth daily.    . irbesartan (AVAPRO) 150 MG tablet Take 1 tablet (150 mg total) by mouth daily. 90 tablet 3  . nitroGLYCERIN (NITRODUR - DOSED IN MG/24 HR) 0.2 mg/hr patch 1/4 patch daily 30 patch 1   No current facility-administered medications on  file prior to visit.     Past Medical History:  Diagnosis Date  . Benign prostatic hypertrophy with elevated PSA    Dr Alinda Money, High grade PIN w/o malignancy  . History of colonic polyps 2003, 2007   hyperplastic  . Hx of colonoscopy 2012   neg  . Hyperlipidemia   . Hypertension   . Retinal vascular occlusion    Dr Delman Cheadle    Past Surgical History:  Procedure Laterality Date  . APPENDECTOMY    . COLONOSCOPY W/ POLYPECTOMY      X2 , Dr Deatra Ina  . HERNIA REPAIR  2009,2011    Dr Margot Chimes  . lichenoid keratosis resected  8/14  . LIPOMA EXCISION     X 5  . PROSTATE BIOPSY  09/2010   Dr Alinda Money    Social History   Socioeconomic History  . Marital status: Married    Spouse name: None  . Number of children: None  . Years of education: None  . Highest education level: None  Social Needs  . Financial resource strain: None  . Food insecurity - worry: None  . Food insecurity - inability: None  . Transportation needs - medical: None  . Transportation needs - non-medical: None  Occupational History  . None  Tobacco Use  . Smoking status: Never Smoker  . Smokeless tobacco: Never Used  Substance and Sexual Activity  . Alcohol use: Yes    Alcohol/week:  12.6 oz    Types: 21 Glasses of wine Devaughn week    Comment:  3 glasses of wine  . Drug use: No  . Sexual activity: None  Other Topics Concern  . None  Social History Narrative  . None    Family History  Problem Relation Age of Onset  . Alzheimer's disease Mother   . Heart attack Father 24  . Hypertension Brother   . Hypertension Maternal Grandmother   . Diabetes Neg Hx   . Stroke Neg Hx     Review of Systems  Constitutional: Negative for chills and fever.  Gastrointestinal: Negative for abdominal pain, constipation and nausea.  Genitourinary: Negative for dysuria and hematuria.  Skin: Negative for color change and rash.       Objective:   Vitals:   06/10/17 0916  BP: (!) 156/90  Pulse: 65  Resp: 16  Temp:  98.3 F (36.8 C)  SpO2: 98%   Wt Readings from Last 3 Encounters:  06/10/17 226 lb (102.5 kg)  06/09/17 226 lb (102.5 kg)  03/24/17 218 lb (98.9 kg)   Body mass index is 27.15 kg/m.   Physical Exam  Constitutional: He appears well-developed and well-nourished. No distress.  HENT:  Head: Normocephalic and atraumatic.  Abdominal: Soft. He exhibits no distension and no mass. There is no tenderness. There is no rebound and no guarding.  Genitourinary:  Genitourinary Comments: No obvious suprapubic or inguinal mass, minimal tenderness with palpation, no testicular tenderness or swelling  Skin: Skin is warm and dry. No rash noted. He is not diaphoretic. No erythema.           Assessment & Plan:    See Problem List for Assessment and Plan of chronic medical problems.

## 2017-06-17 ENCOUNTER — Encounter: Payer: Self-pay | Admitting: Internal Medicine

## 2017-06-25 DIAGNOSIS — R1032 Left lower quadrant pain: Secondary | ICD-10-CM | POA: Diagnosis not present

## 2017-07-29 ENCOUNTER — Other Ambulatory Visit: Payer: Self-pay

## 2017-07-29 ENCOUNTER — Encounter (HOSPITAL_BASED_OUTPATIENT_CLINIC_OR_DEPARTMENT_OTHER): Payer: Self-pay

## 2017-07-29 ENCOUNTER — Inpatient Hospital Stay (HOSPITAL_BASED_OUTPATIENT_CLINIC_OR_DEPARTMENT_OTHER)
Admission: EM | Admit: 2017-07-29 | Discharge: 2017-07-30 | DRG: 310 | Disposition: A | Discharge status: Home / Self Care | Payer: PPO | Attending: Internal Medicine | Admitting: Internal Medicine

## 2017-07-29 ENCOUNTER — Emergency Department (HOSPITAL_BASED_OUTPATIENT_CLINIC_OR_DEPARTMENT_OTHER): Payer: PPO

## 2017-07-29 DIAGNOSIS — I4891 Unspecified atrial fibrillation: Principal | ICD-10-CM | POA: Diagnosis present

## 2017-07-29 DIAGNOSIS — R0600 Dyspnea, unspecified: Secondary | ICD-10-CM | POA: Diagnosis not present

## 2017-07-29 DIAGNOSIS — E785 Hyperlipidemia, unspecified: Secondary | ICD-10-CM | POA: Diagnosis present

## 2017-07-29 DIAGNOSIS — Z8249 Family history of ischemic heart disease and other diseases of the circulatory system: Secondary | ICD-10-CM | POA: Diagnosis not present

## 2017-07-29 DIAGNOSIS — F1020 Alcohol dependence, uncomplicated: Secondary | ICD-10-CM | POA: Diagnosis present

## 2017-07-29 DIAGNOSIS — I480 Paroxysmal atrial fibrillation: Secondary | ICD-10-CM | POA: Diagnosis present

## 2017-07-29 DIAGNOSIS — Z7982 Long term (current) use of aspirin: Secondary | ICD-10-CM | POA: Diagnosis not present

## 2017-07-29 DIAGNOSIS — Z79899 Other long term (current) drug therapy: Secondary | ICD-10-CM | POA: Diagnosis not present

## 2017-07-29 DIAGNOSIS — I5030 Unspecified diastolic (congestive) heart failure: Secondary | ICD-10-CM | POA: Diagnosis not present

## 2017-07-29 DIAGNOSIS — I10 Essential (primary) hypertension: Secondary | ICD-10-CM | POA: Diagnosis present

## 2017-07-29 DIAGNOSIS — E782 Mixed hyperlipidemia: Secondary | ICD-10-CM | POA: Diagnosis present

## 2017-07-29 HISTORY — DX: Unspecified atrial fibrillation: I48.91

## 2017-07-29 LAB — COMPREHENSIVE METABOLIC PANEL
ALT: 30 U/L (ref 17–63)
AST: 29 U/L (ref 15–41)
Albumin: 4.1 g/dL (ref 3.5–5.0)
Alkaline Phosphatase: 57 U/L (ref 38–126)
Anion gap: 10 (ref 5–15)
BUN: 16 mg/dL (ref 6–20)
CHLORIDE: 107 mmol/L (ref 101–111)
CO2: 24 mmol/L (ref 22–32)
Calcium: 8.9 mg/dL (ref 8.9–10.3)
Creatinine, Ser: 0.77 mg/dL (ref 0.61–1.24)
GFR calc Af Amer: >60 mL/min (ref >60)
Glucose, Bld: 96 mg/dL (ref 65–99)
POTASSIUM: 4 mmol/L (ref 3.5–5.1)
Sodium: 141 mmol/L (ref 135–145)
Total Bilirubin: 0.5 mg/dL (ref 0.3–1.2)
Total Protein: 6.9 g/dL (ref 6.5–8.1)

## 2017-07-29 LAB — CBC WITH DIFFERENTIAL/PLATELET
BASOS PCT: 1 %
Basophils Absolute: 0.1 10*3/uL (ref 0.0–0.1)
EOS PCT: 1 %
Eosinophils Absolute: 0.1 10*3/uL (ref 0.0–0.7)
HCT: 42.7 % (ref 39.0–52.0)
Hemoglobin: 14.4 g/dL (ref 13.0–17.0)
LYMPHS PCT: 44 %
Lymphs Abs: 2.4 10*3/uL (ref 0.7–4.0)
MCH: 30.8 pg (ref 26.0–34.0)
MCHC: 33.7 g/dL (ref 30.0–36.0)
MCV: 91.4 fL (ref 78.0–100.0)
MONO ABS: 0.6 10*3/uL (ref 0.1–1.0)
Monocytes Relative: 11 %
Neutro Abs: 2.3 10*3/uL (ref 1.7–7.7)
Neutrophils Relative %: 43 %
PLATELETS: 177 10*3/uL (ref 150–400)
RBC: 4.67 MIL/uL (ref 4.22–5.81)
RDW: 13.2 % (ref 11.5–15.5)
WBC: 5.4 10*3/uL (ref 4.0–10.5)

## 2017-07-29 LAB — URINALYSIS, ROUTINE W REFLEX MICROSCOPIC
Bilirubin Urine: NEGATIVE
Glucose, UA: NEGATIVE mg/dL
Hgb urine dipstick: NEGATIVE
KETONES UR: NEGATIVE mg/dL
Leukocytes, UA: NEGATIVE
NITRITE: NEGATIVE
PROTEIN: NEGATIVE mg/dL
Specific Gravity, Urine: <1.005 — ABNORMAL LOW (ref 1.005–1.030)
pH: 6 (ref 5.0–8.0)

## 2017-07-29 LAB — TROPONIN I

## 2017-07-29 LAB — TSH: TSH: 1.688 u[IU]/mL (ref 0.350–4.500)

## 2017-07-29 LAB — MAGNESIUM: MAGNESIUM: 2 mg/dL (ref 1.7–2.4)

## 2017-07-29 MED ORDER — SODIUM CHLORIDE 0.9 % IV BOLUS (SEPSIS)
500.0000 mL | Freq: Once | INTRAVENOUS | Status: AC
  Administered 2017-07-29: 500 mL via INTRAVENOUS

## 2017-07-29 MED ORDER — DILTIAZEM HCL 25 MG/5ML IV SOLN
10.0000 mg | Freq: Once | INTRAVENOUS | Status: AC
  Administered 2017-07-29: 10 mg via INTRAVENOUS
  Filled 2017-07-29: qty 5

## 2017-07-29 MED ORDER — DILTIAZEM HCL 100 MG IV SOLR
5.0000 mg/h | INTRAVENOUS | Status: DC
  Administered 2017-07-29: 5 mg/h via INTRAVENOUS
  Administered 2017-07-30: 10 mg/h via INTRAVENOUS

## 2017-07-29 MED ORDER — DILTIAZEM HCL 100 MG IV SOLR
INTRAVENOUS | Status: AC
  Filled 2017-07-29: qty 100

## 2017-07-29 NOTE — ED Notes (Signed)
ED Provider at bedside. 

## 2017-07-29 NOTE — ED Notes (Signed)
HR is still between 109 to 127, a-fib.  Cardizem increased to 7.5 mg from 5 mg

## 2017-07-29 NOTE — ED Provider Notes (Addendum)
[name] PROGRESSIVE CARE Provider Note   CSN: [id number] Arrival date & time: 07/29/17  1827     History   Chief Complaint Chief Complaint  Patient presents with  . Irregular Heart Beat    HPI [name] [name] is a 67 y.o. male.  HPI Patient presents with a "buzzing" in his chest and mild lightheadedness starting roughly 1 hour prior to presentation.  States he was cooking dinner and had a glass of wine prior to onset.  Denies any chest pain.  No recent fever or chills.  No new lower extremity swelling or pain.  No focal weakness or numbness. Past Medical History:  Diagnosis Date  . Atrial fibrillation with RVR (HCC)    Converted to NSR with lopressor  . Benign prostatic hypertrophy with elevated PSA    Dr Alinda Money, High grade PIN w/o malignancy  . History of colonic polyps 2003, 2007   hyperplastic  . Hx of colonoscopy 2012   neg  . Hyperlipidemia   . Hypertension   . Retinal vascular occlusion    Dr Delman Cheadle    Patient Active Problem List   Diagnosis Date Noted  . A-fib ([name] [name]) 07/30/2017  . EtOH dependence ([place]) 07/30/2017  . New onset atrial fibrillation ([name] [name]) 07/29/2017  . Varicose veins of left lower extremity with complications 53/29/9242  . Greater trochanteric bursitis of right hip 01/02/2017  . Prediabetes 09/20/2016  . Rotator cuff syndrome of right shoulder 09/27/2015  . Vitamin D deficiency 09/07/2014  . Lichenoid keratosis 68/34/1962  . Myalgia and myositis 09/01/2013  . Superficial thrombosis of leg 03/28/2013  . 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 Inguinal hernia recurrent unilateral 08/10/2009  . History of colonic polyps 05/15/2008  . Essential hypertension 05/15/2008  . HYPERLIPIDEMIA 05/12/2007  . Elevated prostate specific antigen (PSA) 05/12/2007    Past Surgical History:  Procedure Laterality Date  . APPENDECTOMY    . COLONOSCOPY W/ POLYPECTOMY      X2 , Dr Deatra Ina  . HERNIA REPAIR  2009,2011    Dr Margot Chimes  . lichenoid keratosis resected   8/14  . LIPOMA EXCISION     X 5  . PROSTATE BIOPSY  09/2010   Dr Alinda Money       Home Medications    Prior to Admission medications   Medication Sig Start Date End Date Taking? Authorizing Provider  Cholecalciferol (VITAMIN D3) 2000 units TABS Take by mouth.   Yes [provider]  finasteride (PROSCAR) 5 MG tablet Take 5 mg by mouth daily.   Yes [provider]  apixaban (ELIQUIS) 5 MG TABS tablet Take 1 tablet (5 mg total) by mouth 2 (two) times daily. 07/30/17   Tommie Raymond, NP  metoprolol tartrate (LOPRESSOR) 25 MG tablet Take 1 tablet (25 mg total) by mouth 2 (two) times daily. 07/30/17   Tommie Raymond, NP  silodosin (RAPAFLO) 8 MG CAPS capsule Take 8 mg by mouth daily with breakfast.    Karen Kays, NP    Family History Family History  Problem Relation Age of Onset  . Alzheimer's disease Mother   . Heart attack Father 26  . Hypertension Brother   . Hypertension Maternal Grandmother   . Diabetes Neg Hx   . Stroke Neg Hx     Social History Social History   Tobacco Use  . Smoking status: Never Smoker  . Smokeless tobacco: Never Used  Substance Use Topics  . Alcohol use: Yes    Alcohol/week: 1.8 oz  Types: 3 Glasses of wine [place] week    Comment:  4 glasses of wine/day in the past, < 1/day now  . Drug use: No     Allergies   Patient has no known allergies.   Review of Systems Review of Systems  Constitutional: Negative for chills and fever.  HENT: Negative for congestion, sore throat and trouble swallowing.   Eyes: Negative for visual disturbance.  Respiratory: Negative for cough, chest tightness and shortness of breath.   Cardiovascular: Positive for palpitations. Negative for chest pain and leg swelling.  Gastrointestinal: Negative for abdominal pain, nausea and vomiting.  Genitourinary: Negative for dysuria, flank pain and frequency.  Musculoskeletal: Negative for back pain, myalgias, neck pain and neck stiffness.  Skin:  Negative for rash and wound.  Neurological: Positive for light-headedness. Negative for dizziness, syncope, weakness, numbness and headaches.  All other systems reviewed and are negative.    Physical Exam Updated Vital Signs BP 116/77   Pulse (!) 140   Temp 97.7 F (36.5 C) (Oral)   Resp 14   Ht 6\' 4"  (1.93 m)   Wt 100.2 kg (220 lb 14.4 oz)   SpO2 98%   BMI 26.89 kg/m   Physical Exam  Constitutional: He is oriented to person, place, and time. He appears well-developed and well-nourished. No distress.  HENT:  Head: Normocephalic and atraumatic.  Mouth/Throat: Oropharynx is clear and moist. No oropharyngeal exudate.  Eyes: EOM are normal. Pupils are equal, round, and reactive to light.  Neck: Normal range of motion. Neck supple. No thyromegaly present.  Cardiovascular:  Tachycardia.  Irregularly irregular.  Pulmonary/Chest: Effort normal and breath sounds normal. No stridor. No respiratory distress. He has no wheezes. He has no rales. He exhibits no tenderness.  Abdominal: Soft. Bowel sounds are normal. There is no tenderness. There is no rebound and no guarding.  Musculoskeletal: Normal range of motion. He exhibits no edema or tenderness.  No lower extremity swelling, asymmetry or tenderness.  Lymphadenopathy:    He has no cervical adenopathy.  Neurological: He is alert and oriented to person, place, and time.  Moves all extremities without deficit.  Sensation fully intact.  Skin: Skin is warm and dry. Capillary refill takes less than 2 seconds. No rash noted. He is not diaphoretic. No erythema.  Psychiatric: He has a normal mood and affect. His behavior is normal.  Nursing note and vitals reviewed.    ED Treatments / Results  Labs (all labs ordered are listed, but only abnormal results are displayed) Labs Reviewed  URINALYSIS, ROUTINE W REFLEX MICROSCOPIC - Abnormal; Notable for the following components:      Result Value   Specific Gravity, Urine <1.005 (*)    All  other components within normal limits  BASIC METABOLIC PANEL - Abnormal; Notable for the following components:   Calcium 8.8 (*)    All other components within normal limits  BRAIN NATRIURETIC PEPTIDE - Abnormal; Notable for the following components:   B Natriuretic Peptide 180.5 (*)    All other components within normal limits  HEPATIC FUNCTION PANEL - Abnormal; Notable for the following components:   Total Protein 6.1 (*)    Total Bilirubin 1.4 (*)    Indirect Bilirubin 1.0 (*)    All other components within normal limits  MRSA PCR SCREENING  CBC WITH DIFFERENTIAL/PLATELET  COMPREHENSIVE METABOLIC PANEL  TROPONIN I  TSH  MAGNESIUM  CBC  TROPONIN I  LIPID PANEL    EKG  EKG Interpretation None  ED ECG REPORT   Date: 09/20/2017  Rate: 132  Rhythm: atrial fibrillation  QRS Axis: normal  Intervals: normal  ST/T Wave abnormalities: nonspecific T wave changes  Conduction Disutrbances:none  Narrative Interpretation:   Old EKG Reviewed: changes noted  I have personally reviewed the EKG tracing and agree with the computerized printout as noted.   Radiology No results found.  Procedures Procedures (including critical care time)  Medications Ordered in ED Medications  dextrose 5 % with diltiazem (CARDIZEM) ADS Med (has no administration in time range)  diltiazem (CARDIZEM) injection 10 mg (10 mg Intravenous Given 07/29/17 1952)  sodium chloride 0.9 % bolus 500 mL (0 mLs Intravenous Stopped 07/29/17 1952)  enoxaparin (LOVENOX) injection 100 mg (100 mg Subcutaneous Given 07/30/17 0322)  off the beat book ( Does not apply Given 07/30/17 1629)   CRITICAL CARE Performed by: Julianne Rice Total critical care time: 40 minutes Critical care time was exclusive of separately billable procedures and treating other patients. Critical care was necessary to treat or prevent imminent or life-threatening deterioration. Critical care was time spent personally by me on the  following activities: development of treatment plan with patient and/or surrogate as well as nursing, discussions with consultants, evaluation of patient's response to treatment, examination of patient, obtaining history from patient or surrogate, ordering and performing treatments and interventions, ordering and review of laboratory studies, ordering and review of radiographic studies, pulse oximetry and re-evaluation of patient's condition.  Initial Impression / Assessment and Plan / ED Course  I have reviewed the triage vital signs and the nursing notes.  Pertinent labs & imaging results that were available during my care of the patient were reviewed by me and considered in my medical decision making (see chart for details).     Patient with new onset atrial fibrillation with rapid ventricular rate.  Given IV dose of Cardizem with some improvement heart rate but then increased back into the 120s and 30s.  Started on Cardizem drip.  Discussed with Dr. Emilio Aspen, cardiology fellow on call.  Will accept in transfer to [facility] stepdown when bed become available.  Final Clinical Impressions(s) / ED Diagnoses   Final diagnoses:  New onset atrial fibrillation [facility])    ED Discharge Orders        Ordered    apixaban (ELIQUIS) 5 MG TABS tablet  2 times daily,   Status:  Discontinued     07/30/17 1647    apixaban (ELIQUIS) 5 MG TABS tablet  2 times daily,   Status:  Discontinued     07/30/17 1656    metoprolol tartrate (LOPRESSOR) 25 MG tablet  2 times daily     07/30/17 1656    Increase activity slowly     07/30/17 1656    Diet - low sodium heart healthy     07/30/17 1656    Discharge instructions    Comments:  If you notice any bleeding such as blood in stool, black tarry stools, blood in urine, nosebleeds or any other unusual bleeding, call your doctor immediately.   07/30/17 1656    Call MD for:  persistant dizziness or light-headedness     07/30/17 1656    apixaban (ELIQUIS) 5 MG  TABS tablet  2 times daily     07/30/17 1657       Julianne Rice, MD 07/29/17 2349    Julianne Rice, MD 09/20/17 [phone]

## 2017-07-29 NOTE — ED Notes (Signed)
EDP at bedside and ordered to start Cardizem drip and spoke with the patient and family that he recommends that patient will be admitted.  Patient and wife verbalized understanding.

## 2017-07-29 NOTE — ED Notes (Signed)
After Cardizem 10 mg IV was given, HR slows down to low 100's and sometime between 99-110 but it doesn't sustained in this rate, still a-fib.  Patient stated that he can feel his heart raising.

## 2017-07-29 NOTE — ED Triage Notes (Signed)
Approximately an hour ago pt started experiencing an irregular heart beat and SOB, has never had this feeling before, denies chest pain, denies dizziness

## 2017-07-30 ENCOUNTER — Inpatient Hospital Stay (HOSPITAL_COMMUNITY): Payer: PPO

## 2017-07-30 ENCOUNTER — Other Ambulatory Visit (HOSPITAL_COMMUNITY): Payer: PPO

## 2017-07-30 ENCOUNTER — Encounter (HOSPITAL_COMMUNITY): Payer: Self-pay | Admitting: *Deleted

## 2017-07-30 DIAGNOSIS — I4891 Unspecified atrial fibrillation: Secondary | ICD-10-CM | POA: Diagnosis present

## 2017-07-30 DIAGNOSIS — F1020 Alcohol dependence, uncomplicated: Secondary | ICD-10-CM

## 2017-07-30 DIAGNOSIS — I5030 Unspecified diastolic (congestive) heart failure: Secondary | ICD-10-CM

## 2017-07-30 LAB — CBC
HCT: 44.1 % (ref 39.0–52.0)
Hemoglobin: 14.7 g/dL (ref 13.0–17.0)
MCH: 30.7 pg (ref 26.0–34.0)
MCHC: 33.3 g/dL (ref 30.0–36.0)
MCV: 92.1 fL (ref 78.0–100.0)
PLATELETS: 191 10*3/uL (ref 150–400)
RBC: 4.79 MIL/uL (ref 4.22–5.81)
RDW: 13.4 % (ref 11.5–15.5)
WBC: 6.5 10*3/uL (ref 4.0–10.5)

## 2017-07-30 LAB — HEPATIC FUNCTION PANEL
ALK PHOS: 50 U/L (ref 38–126)
ALT: 24 U/L (ref 17–63)
AST: 32 U/L (ref 15–41)
Albumin: 4 g/dL (ref 3.5–5.0)
BILIRUBIN DIRECT: 0.4 mg/dL (ref 0.1–0.5)
BILIRUBIN INDIRECT: 1 mg/dL — AB (ref 0.3–0.9)
Total Bilirubin: 1.4 mg/dL — ABNORMAL HIGH (ref 0.3–1.2)
Total Protein: 6.1 g/dL — ABNORMAL LOW (ref 6.5–8.1)

## 2017-07-30 LAB — LIPID PANEL
CHOL/HDL RATIO: 2.1 ratio
Cholesterol: 159 mg/dL (ref 0–200)
HDL: 77 mg/dL (ref >40)
LDL Cholesterol: 73 mg/dL (ref 0–99)
TRIGLYCERIDES: 43 mg/dL (ref <150)
VLDL: 9 mg/dL (ref 0–40)

## 2017-07-30 LAB — BASIC METABOLIC PANEL
Anion gap: 12 (ref 5–15)
BUN: 16 mg/dL (ref 6–20)
CALCIUM: 8.8 mg/dL — AB (ref 8.9–10.3)
CHLORIDE: 107 mmol/L (ref 101–111)
CO2: 23 mmol/L (ref 22–32)
Creatinine, Ser: 0.76 mg/dL (ref 0.61–1.24)
GFR calc non Af Amer: >60 mL/min (ref >60)
GLUCOSE: 92 mg/dL (ref 65–99)
Potassium: 4 mmol/L (ref 3.5–5.1)
Sodium: 142 mmol/L (ref 135–145)

## 2017-07-30 LAB — BRAIN NATRIURETIC PEPTIDE: B Natriuretic Peptide: 180.5 pg/mL — ABNORMAL HIGH (ref 0.0–100.0)

## 2017-07-30 LAB — ECHOCARDIOGRAM COMPLETE
Height: 76 in
WEIGHTICAEL: 3534.41 [oz_av]

## 2017-07-30 LAB — TROPONIN I

## 2017-07-30 LAB — MRSA PCR SCREENING: MRSA BY PCR: NEGATIVE

## 2017-07-30 MED ORDER — ACETAMINOPHEN 325 MG PO TABS: 650.0000 mg | ORAL_TABLET | ORAL | Status: DC | PRN

## 2017-07-30 MED ORDER — APIXABAN 5 MG PO TABS
5.0000 mg | ORAL_TABLET | Freq: Two times a day (BID) | ORAL | 3 refills | Status: DC
Start: 2017-07-30 — End: 2017-07-30

## 2017-07-30 MED ORDER — FINASTERIDE 5 MG PO TABS
5.0000 mg | ORAL_TABLET | Freq: Every day | ORAL | Status: DC
  Administered 2017-07-30: 5 mg via ORAL
  Filled 2017-07-30: qty 1

## 2017-07-30 MED ORDER — RIVAROXABAN 20 MG PO TABS: 20.0000 mg | ORAL_TABLET | Freq: Every day | ORAL | Status: DC

## 2017-07-30 MED ORDER — ONDANSETRON HCL 4 MG/2ML IJ SOLN: 4.0000 mg | Freq: Four times a day (QID) | INTRAMUSCULAR | Status: DC | PRN

## 2017-07-30 MED ORDER — OFF THE BEAT BOOK
Freq: Once | Status: AC
  Administered 2017-07-30: 16:00:00
  Filled 2017-07-30: qty 1

## 2017-07-30 MED ORDER — METOPROLOL TARTRATE 25 MG PO TABS
25.0000 mg | ORAL_TABLET | Freq: Three times a day (TID) | ORAL | Status: DC
  Administered 2017-07-30: 25 mg via ORAL
  Filled 2017-07-30: qty 1

## 2017-07-30 MED ORDER — APIXABAN 5 MG PO TABS
5.0000 mg | ORAL_TABLET | Freq: Two times a day (BID) | ORAL | Status: DC
  Administered 2017-07-30: 5 mg via ORAL
  Filled 2017-07-30: qty 1

## 2017-07-30 MED ORDER — DILTIAZEM HCL ER COATED BEADS 180 MG PO CP24
360.0000 mg | ORAL_CAPSULE | Freq: Every day | ORAL | Status: DC
  Filled 2017-07-30: qty 2

## 2017-07-30 MED ORDER — IRBESARTAN 150 MG PO TABS
150.0000 mg | ORAL_TABLET | Freq: Every day | ORAL | Status: DC
  Administered 2017-07-30: 150 mg via ORAL
  Filled 2017-07-30: qty 1

## 2017-07-30 MED ORDER — ASPIRIN EC 81 MG PO TBEC
81.0000 mg | DELAYED_RELEASE_TABLET | Freq: Every day | ORAL | Status: DC
  Administered 2017-07-30: 81 mg via ORAL
  Filled 2017-07-30: qty 1

## 2017-07-30 MED ORDER — METOPROLOL TARTRATE 25 MG PO TABS
25.0000 mg | ORAL_TABLET | Freq: Two times a day (BID) | ORAL | Status: DC
  Administered 2017-07-30: 25 mg via ORAL
  Filled 2017-07-30: qty 1

## 2017-07-30 MED ORDER — APIXABAN 5 MG PO TABS: 5.0000 mg | ORAL_TABLET | Freq: Two times a day (BID) | ORAL | 0 refills | Status: DC

## 2017-07-30 MED ORDER — ENOXAPARIN SODIUM 100 MG/ML ~~LOC~~ SOLN
1.0000 mg/kg | Freq: Once | SUBCUTANEOUS | Status: AC
  Administered 2017-07-30: 100 mg via SUBCUTANEOUS
  Filled 2017-07-30: qty 1

## 2017-07-30 MED ORDER — METOPROLOL TARTRATE 25 MG PO TABS: 25.0000 mg | ORAL_TABLET | Freq: Two times a day (BID) | ORAL | 3 refills | Status: DC

## 2017-07-30 NOTE — Progress Notes (Signed)
  Echocardiogram 2D Echocardiogram has been performed.  Kristin Lamagna L Androw 07/30/2017, 3:35 PM

## 2017-07-30 NOTE — H&P (Signed)
CARDIOLOGY H&P  HPI:  Fredi DEVEREAUX GRAYSON is a 67 y.o. male w/ history of HTN, HLD presenting with AF.  Patient reports developing sudden onset palpitations at about 6pm on 07/29/17. He was not doing anything out of the ordinary at the time. He denies any symptoms of chest pain or chest pressure. He does not recall having anything like this in the past, certainly not this duration. He drinks approximately 4 glasses of wine daily, sometimes more. He has never had issues with alcohol withdrawal. He went 24 hours without drinking last week and did not have any withdrawal symptoms. He denies any recent infectious symptoms. He is a never smoker and does not use illicit drugs.   The patient came to the ED and was found to be in rapid AF. He was started on a diltiazem gtt and transferred to [facility] for further treatment.   Review of Systems:     Cardiac Review of Systems: {Y] = yes [ ]  = no  Chest Pain [    ]  Resting SOB [   ] Exertional SOB  [  ]  Orthopnea [  ]   Pedal Edema [   ]    Palpitations [ Y ] Syncope  [  ]   Presyncope [   ]  General Review of Systems: [Y] = yes [  ]=no Constitional: recent weight change [  ]; anorexia [  ]; fatigue [  ]; nausea [  ]; night sweats [  ]; fever [  ]; or chills [  ];                                                                     Dental: poor dentition[  ];   Eye : blurred vision [  ]; diplopia [   ]; vision changes [  ];  Amaurosis fugax[  ]; Resp: cough [  ];  wheezing[  ];  hemoptysis[  ]; shortness of breath[  ]; paroxysmal nocturnal dyspnea[  ]; dyspnea on exertion[  ]; or orthopnea[  ];  GI:  gallstones[  ], vomiting[  ];  dysphagia[  ]; melena[  ];  hematochezia [  ]; heartburn[  ];   GU: kidney stones [  ]; hematuria[  ];   dysuria [  ];  nocturia[  ];               Skin: rash [  ], swelling[  ];, hair loss[  ];  peripheral edema[  ];  or itching[  ]; Musculosketetal: myalgias[  ];  joint swelling[  ];  joint erythema[  ];  joint pain[  ];  back  pain[  ];  Heme/Lymph: bruising[  ];  bleeding[  ];  anemia[  ];  Neuro: TIA[  ];  headaches[  ];  stroke[  ];  vertigo[  ];  seizures[  ];   paresthesias[  ];  difficulty walking[  ];  Psych:depression[  ]; anxiety[  ];  Endocrine: diabetes[  ];  thyroid dysfunction[  ];  Other:  Past Medical History:  Diagnosis Date  . Benign prostatic hypertrophy with elevated PSA    Dr Alinda Money, High grade PIN w/o malignancy  . History of colonic polyps 2003, 2007  hyperplastic  . Hx of colonoscopy 2012   neg  . Hyperlipidemia   . Hypertension   . Retinal vascular occlusion    Dr Delman Cheadle    Prior to Admission medications   Medication Sig Start Date End Date Taking? Authorizing Provider  aspirin 81 MG tablet Take 81 mg by mouth daily.    [provider]  Cholecalciferol (VITAMIN D3) 2000 units TABS Take by mouth.    [provider]  finasteride (PROSCAR) 5 MG tablet Take 5 mg by mouth daily.    [provider]  irbesartan (AVAPRO) 150 MG tablet Take 1 tablet (150 mg total) by mouth daily. 09/15/16   Binnie Rail, MD      No Known Allergies  Social History   Socioeconomic History  . Marital status: Married    Spouse name: Not on file  . Number of children: Not on file  . Years of education: Not on file  . Highest education level: Not on file  Social Needs  . Financial resource strain: Not on file  . Food insecurity - worry: Not on file  . Food insecurity - inability: Not on file  . Transportation needs - medical: Not on file  . Transportation needs - non-medical: Not on file  Occupational History  . Not on file  Tobacco Use  . Smoking status: Never Smoker  . Smokeless tobacco: Never Used  Substance and Sexual Activity  . Alcohol use: Yes    Alcohol/week: 12.6 oz    Types: 21 Glasses of wine [place] week    Comment:  3 glasses of wine  . Drug use: No  . Sexual activity: Not on file  Other Topics Concern  . Not on file  Social History Narrative  . Not  on file    Family History  Problem Relation Age of Onset  . Alzheimer's disease Mother   . Heart attack Father 75  . Hypertension Brother   . Hypertension Maternal Grandmother   . Diabetes Neg Hx   . Stroke Neg Hx     PHYSICAL EXAM: Vitals:   07/30/17 0000 07/30/17 0210  BP: (!) 122/111 (!) 138/96  Pulse: (!) 40 70  Resp: 14   Temp:  98.9 F (37.2 C)  SpO2: 99% 98%   General:  Well appearing. No respiratory difficulty HEENT: normal Neck: supple. JVP elevated to ~8cm H2O Cor: PMI nondisplaced. Irregularly irregular rhythm. No rubs, gallops or murmurs. Lungs: clear Abdomen: soft, nontender, nondistended. No hepatosplenomegaly. No bruits or masses. Good bowel sounds. Extremities: no cyanosis, clubbing, rash. Trace pitting edema of the bilateral ankles.  Neuro: alert & oriented x 3, cranial nerves grossly intact. moves all 4 extremities w/o difficulty. Affect pleasant.  ECG: AF w/ RVR  Results for orders placed or performed during the hospital encounter of 07/29/17 (from the past 24 hour(s))  Urinalysis, Routine w reflex microscopic     Status: Abnormal   Collection Time: 07/29/17  7:05 PM  Result Value Ref Range   Color, Urine YELLOW YELLOW   APPearance CLEAR CLEAR   Specific Gravity, Urine <1.005 (L) 1.005 - 1.030   pH 6.0 5.0 - 8.0   Glucose, UA NEGATIVE NEGATIVE mg/dL   Hgb urine dipstick NEGATIVE NEGATIVE   Bilirubin Urine NEGATIVE NEGATIVE   Ketones, ur NEGATIVE NEGATIVE mg/dL   Protein, ur NEGATIVE NEGATIVE mg/dL   Nitrite NEGATIVE NEGATIVE   Leukocytes, UA NEGATIVE NEGATIVE  CBC with Differential/Platelet     Status: None   Collection  Time: 07/29/17  7:22 PM  Result Value Ref Range   WBC 5.4 4.0 - 10.5 K/uL   RBC 4.67 4.22 - 5.81 MIL/uL   Hemoglobin 14.4 13.0 - 17.0 g/dL   HCT 42.7 39.0 - 52.0 %   MCV 91.4 78.0 - 100.0 fL   MCH 30.8 26.0 - 34.0 pg   MCHC 33.7 30.0 - 36.0 g/dL   RDW 13.2 11.5 - 15.5 %   Platelets 177 150 - 400 K/uL   Neutrophils  Relative % 43 %   Neutro Abs 2.3 1.7 - 7.7 K/uL   Lymphocytes Relative 44 %   Lymphs Abs 2.4 0.7 - 4.0 K/uL   Monocytes Relative 11 %   Monocytes Absolute 0.6 0.1 - 1.0 K/uL   Eosinophils Relative 1 %   Eosinophils Absolute 0.1 0.0 - 0.7 K/uL   Basophils Relative 1 %   Basophils Absolute 0.1 0.0 - 0.1 K/uL  Comprehensive metabolic panel     Status: None   Collection Time: 07/29/17  7:22 PM  Result Value Ref Range   Sodium 141 135 - 145 mmol/L   Potassium 4.0 3.5 - 5.1 mmol/L   Chloride 107 101 - 111 mmol/L   CO2 24 22 - 32 mmol/L   Glucose, Bld 96 65 - 99 mg/dL   BUN 16 6 - 20 mg/dL   Creatinine, Ser 0.77 0.61 - 1.24 mg/dL   Calcium 8.9 8.9 - 10.3 mg/dL   Total Protein 6.9 6.5 - 8.1 g/dL   Albumin 4.1 3.5 - 5.0 g/dL   AST 29 15 - 41 U/L   ALT 30 17 - 63 U/L   Alkaline Phosphatase 57 38 - 126 U/L   Total Bilirubin 0.5 0.3 - 1.2 mg/dL   GFR calc non Af Amer >60 >60 mL/min   GFR calc Af Amer >60 >60 mL/min   Anion gap 10 5 - 15  Troponin I     Status: None   Collection Time: 07/29/17  7:22 PM  Result Value Ref Range   Troponin I <0.03 <0.03 ng/mL  TSH     Status: None   Collection Time: 07/29/17  7:22 PM  Result Value Ref Range   TSH 1.688 0.350 - 4.500 uIU/mL  Magnesium     Status: None   Collection Time: 07/29/17  7:22 PM  Result Value Ref Range   Magnesium 2.0 1.7 - 2.4 mg/dL   Dg Chest 2 View  Result Date: 07/29/2017 CLINICAL DATA:  Irregular heart beat and dyspnea. EXAM: CHEST - 2 VIEW COMPARISON:  None. FINDINGS: The heart size and mediastinal contours are within normal limits. Pulmonary hyperinflation, right upper lobe predominant with slight crowding of lower lobe interstitial lung markings. No alveolar consolidation, effusion or pneumothorax. No pulmonary edema. The visualized skeletal structures are unremarkable. IMPRESSION: Pulmonary hyperinflation, right upper lobe predominant. No acute pneumonic consolidation or CHF. Electronically Signed   By: Ashley Royalty M.D.    On: 07/29/2017 19:31    ASSESSMENT: Kurk NAYQUAN EVINGER is a 67 y.o. male w/ history of HTN, HLD presenting with new onset AF w RVR.  PLAN/DISCUSSION: #) AF w/ RVR: patient fairly certain of no prior history of AF symptoms and clear onset at 6pm on 3/13. Currently within the 48 hour window where DCCV without TEE would be reasonable.  - NPO except meds - plan for DCCV +/- TEE - TTE ordered - cont diltiazem gtt for now, goal HR < 110 bpm - give 1mg /kg lovenox x 1 now -  start rivaroxaban 20mg  QHS tonight (will need to check affordability with patient's insurance plan)  #) Alcohol use: no history of withdrawal - will hold off on CIWA monitoring for now, though may need this  Marcie Mowers, MD Cardiology Fellow, PGY-5

## 2017-07-30 NOTE — Progress Notes (Addendum)
Benefit check sent for: apixaban (ELIQUIS) tablet 5 mg  Dose: 5 mg Freq: 2 times daily Route: PO  Hawkins, Leonie Man, Jaycee Pelzer, RN  Cc: Nestor Lewandowsky, RN        Co-pay for apixaban (eliquis) $45.00 5mg  twice a day. 30 day supply  NO PA required  [facility] : [facility].   Thank-you     Needs 30 day card

## 2017-07-30 NOTE — Progress Notes (Addendum)
Progress Note  Patient Name: [name] [name] Date of Encounter: 07/30/2017  Primary Cardiologist: New-Dr. Sallyanne Kuster  Subjective   Pt doing ell this AM. Denies chest pain or palpitations. Remains asymptomatic with HR's in the 120's. Dilt gtt.   Inpatient Medications    Scheduled Meds: . aspirin EC  81 mg Oral Daily  . finasteride  5 mg Oral Daily  . irbesartan  150 mg Oral Daily  . rivaroxaban  20 mg Oral Q supper   Continuous Infusions: . diltiazem (CARDIZEM) infusion    . diltiazem (CARDIZEM) infusion 10 mg/hr (07/30/17 0600)   PRN Meds: acetaminophen, ondansetron (ZOFRAN) IV   Vital Signs    Vitals:   07/30/17 0310 07/30/17 0340 07/30/17 0540 07/30/17 0640  BP: (!) 144/108 (!) 133/100 (!) 131/97 113/85  Pulse:      Resp:      Temp:      TempSrc:      SpO2:      Weight:      Height:        Intake/Output Summary (Last 24 hours) at 07/30/2017 0827 Last data filed at 07/30/2017 0600 Gross [name] [name] 24 hour  Intake 557.29 ml  Output -  Net 557.29 ml   Filed Weights   07/29/17 1831 07/30/17 0210  Weight: 225 lb (102.1 kg) 220 lb 14.4 oz (100.2 kg)    Physical Exam   General: Well developed, well nourished, NAD Skin: Warm, dry, intact  Head: Normocephalic, atraumatic, clear, moist mucus membranes. Neck: Negative for carotid bruits. No JVD Lungs:Clear to ausculation bilaterally. No wheezes, rales, or rhonchi. Breathing is unlabored. Cardiovascular: Irregularly irregular with S1 S2. No murmurs, rubs, or gallops Abdomen: Soft, non-tender, non-distended with normoactive bowel sounds. No hepatomegaly, No rebound/guarding. No obvious abdominal masses. MSK: Strength and tone appear normal for age. 5/5 in all extremities Extremities: No edema. No clubbing or cyanosis. DP/PT pulses 2+ bilaterally Neuro: Alert and oriented. No focal deficits. No facial asymmetry. MAE spontaneously. Psych: Responds to questions appropriately with normal affect.    Labs     Chemistry Recent Labs  Lab 07/29/17 1922 07/30/17 0542  NA 141 142  K 4.0 4.0  CL 107 107  CO2 24 23  GLUCOSE 96 92  BUN 16 16  CREATININE 0.77 0.76  CALCIUM 8.9 8.8*  PROT 6.9  --   ALBUMIN 4.1  --   AST 29  --   ALT 30  --   ALKPHOS 57  --   BILITOT 0.5  --   GFRNONAA >60 >60  GFRAA >60 >60  ANIONGAP 10 12     Hematology Recent Labs  Lab 07/29/17 1922 07/30/17 0542  WBC 5.4 6.5  RBC 4.67 4.79  HGB 14.4 14.7  HCT 42.7 44.1  MCV 91.4 92.1  MCH 30.8 30.7  MCHC 33.7 33.3  RDW 13.2 13.4  PLT 177 191    Cardiac Enzymes Recent Labs  Lab 07/29/17 1922 07/30/17 0542  TROPONINI <0.03 <0.03   No results for input(s): TROPIPOC in the last 168 hours.   BNP Recent Labs  Lab 07/30/17 0542  BNP 180.5*     DDimer No results for input(s): DDIMER in the last 168 hours.   Radiology    Dg Chest 2 View  Result Date: 07/29/2017 CLINICAL DATA:  Irregular heart beat and dyspnea. EXAM: CHEST - 2 VIEW COMPARISON:  None. FINDINGS: The heart size and mediastinal contours are within normal limits. Pulmonary hyperinflation, right upper lobe predominant with slight  crowding of lower lobe interstitial lung markings. No alveolar consolidation, effusion or pneumothorax. No pulmonary edema. The visualized skeletal structures are unremarkable. IMPRESSION: Pulmonary hyperinflation, right upper lobe predominant. No acute pneumonic consolidation or CHF. Electronically Signed   By: Ashley Royalty M.D.   On: 07/29/2017 19:31    Telemetry    07/30/17 Atrial fibrillation with rates in the 120's - Personally Reviewed  ECG    Pending EKG this admission - Personally Reviewed  Cardiac Studies   Echocardiogram ordered for today 07/30/2017  Patient Profile     67 y.o. male with a history of HTN, EtOH use, and HLD who presented to [facility] ED on 07/29/2017 with new onset A. fib with RVR.  Assessment & Plan    1.  Atrial fibrillation with RVR: -Heart rates remain variable with elevations to  the 135 bpm range -IV diltiazem at 7.5-10 mg/hr overnight>> will need to increase to 15 mg/hr and monitor>>BP stable  -Metoprolol 25mg  PO Q8H added 07/30/17 -Xarelto QD started yesterday evening 07/29/2017>>>changed to Eliquis 5mg  BID -Fellow overnight mentions DCCV with/without TEE given that he is within the 48h (onset of symptoms at approximately 6pm on  07/29/17) window of onset. Xarelto changed to Eliquis and first dose given today>>>TEE/DCCV schedule full today and tomorrow unfortunately. Pt has plans to go out of town on Monday. Hopes are to have his rate controlled with meds, keep him anticoagulated and do OP DCCV? -Troponin levels, <0.03, <0.03 -TSH, 1.688 -ASA 81  2.  Alcohol use: -Admits to drinking 4 glasses of wine [name], sometimes more -Likely the cause of his atrial fibrillation  -Monitor for WD>>may need to initiate CIWA   3.  HTN: -Stable, 113/85, 131/97, 133/100 -Irbesartan 150 mg  4.  HLD: -Last lipid panel 09/19/2016, CHO-172, HDL-73, LDL-88,Trigs-54 -Will check lipid panel, LFTs and consider starting Lipitor based on results    Signed, Kathyrn Drown NP-C [facility] Pager: [phone] 07/30/2017, 8:27 AM     I have seen and examined the patient along with Kathyrn Drown NP-C.  I have reviewed the chart, notes and new data.  I agree with NP's note.  Key new complaints: only slightly aware of arrhythmia Key examination changes: after adding beta blocker, marked improvement in rate control (80s), otherwise normal CV exam Key new findings / data: normal TSH and lytes. Echo pending  PLAN: Other than HTN, appears to have "lone AFib". (Echo pending). Switch to oral rate control meds. Eliquis started. Cannot find a current ECG and have ordered one. At this point, there are no slots for inpatient DCCV today and tomorrow. If he does well after transition to oral meds, may DC later today and bring back for elective DCCV in 3 weeks (can reschedule sooner with TEE if  he is very symptomatic).  Sanda Klein, MD, [place] [phone] 07/30/2017, 1:43 PM   For questions or updates, please contact   Please consult [email or web address] for contact info under Cardiology/STEMI.

## 2017-07-30 NOTE — Discharge Summary (Signed)
Discharge Summary    Patient ID: [name] [name],  MRN: [id number], DOB/AGE: 17-Oct-1950 67 y.o.  Admit date: 07/29/2017 Discharge date: 07/30/2017  Primary Care Provider: Binnie Rail Primary Cardiologist: Sanda Klein, MD  Discharge Diagnoses    Principal Problem:   New onset atrial fibrillation [facility]) Active Problems:   HYPERLIPIDEMIA   Essential hypertension   A-fib ([place])   EtOH dependence ([place])  Allergies No Known Allergies  Diagnostic Studies/Procedures    Echocardiogram 07/30/2017: Study Conclusions  - Left ventricle: The cavity size was normal. Wall thickness was   increased in a pattern of mild LVH. Systolic function was normal.   The estimated ejection fraction was in the range of 55% to 60%.   Wall motion was normal; there were no regional wall motion   abnormalities. Doppler parameters are consistent with abnormal   left ventricular relaxation (grade 1 diastolic dysfunction). - Aortic valve: There was no stenosis. There was trivial   regurgitation. - Aorta: Mildly dilated aortic root. Aortic root dimension: 39 mm   (ED). - Mitral valve: Mildly calcified annulus. Mildly calcified leaflets   . There was trivial regurgitation. - Right ventricle: The cavity size was normal. Systolic function   was normal. - Pulmonary arteries: No complete TR doppler jet so unable to   estimate PA systolic pressure. - Systemic veins: IVC poorly visualized.  Impressions: - Normal LV size with mild LV hypertrophy. EF 55-60%. Normal RV size and systolic function. No significant valvular   abnormalities.   History of Present Illness     [name] [name] is a 67 year old male with a history of hypertension, HLD and EtOH use who presented to [facility] emergency department on 07/29/2017 with new onset atrial fibrillation with RVR.  [name] [name] presented to [facility] ED on 07/29/2017 after experiencing new onset heart palpitations, described as a "buzzing" feeling in his  chest and mild lightheadedness which began approximately 6 PM on 07/29/17. He stated that he had not experienced an episode like this in the past and had no associated symptoms including chest pain or chest pressure, diaphoresis, shortness of breath, numbness or tingling in his arms or hands, dizziness or syncope.  He does however report drinking approximately 4 glasses of wine every day.  He states that he recently went without drinking for approximately 24 hours without withdrawal symptoms and had no episodes of palpitations. He states that he drinks approximately one cup of coffee every morning. He denies recent infectious symptoms.   Hospital Course     In the emergency department, a chest x-ray was completed which showed pulmonary hyperinflation with no acute pneumonic consolidation or CHF.  His troponin levels were negative x2. He had a mildly elevated BNP at 180.  His TSH was 1.688 and was within normal limits.  His heart rates were ranging in the 120-130's and he was started on a diltiazem gtt for greater rate control.  Upon arrival to the nursing floor, his rates remained elevated throughout the night and in the AM.  A plan was discussed for possible TEE/DCCV after 3 doses of anticoagulation however, the schedule would not allow this on 07/30/2017 or 07/31/2017.  Given that his rates remained elevated despite the increase of diltiazem gtt, metoprolol 25 mg PO TID was added to his medical regimen.  Shortly after his first dose, he converted to normal sinus rhythm/sinus bradycardia.  An EKG was completed and confirmed this rhythm. His IV diltiazem was turned off with the plan of  discharging him home on Eliquis BID and metoprolol 25 mg BID with close follow-up in outpatient setting.  Given his moderately soft BP and heart rate diltiazem PO was no longer considered.  An echocardiogram was completed 07/30/17 which showed a normal LVEF of 55-60%, mild LV hypertrophy and no significant valvular abnormalities.    His blood pressure is stable at 113/80 and his heart rate is currently in the 55-60 bpm range and he is in normal sinus rhythm. Plan is to keep him on Metoprolol 25 mg PO BID and Eliquis PO BID  Consultants: None  Patient has been seen and examined by Dr. Sallyanne Kuster who feels that he is stable and ready for discharge this evening on 07/30/2017.  _____________  Discharge Vitals Blood pressure 116/77, pulse 55, temperature 97.7 F (36.5 C), temperature source Oral, resp. rate 14, height 6\' 4"  (1.93 m), weight 220 lb 14.4 oz (100.2 kg), SpO2 98 %.  Filed Weights   07/29/17 1831 07/30/17 0210  Weight: 225 lb (102.1 kg) 220 lb 14.4 oz (100.2 kg)    Labs & Radiologic Studies    CBC Recent Labs    07/29/17 1922 07/30/17 0542  WBC 5.4 6.5  NEUTROABS 2.3  --   HGB 14.4 14.7  HCT 42.7 44.1  MCV 91.4 92.1  PLT 177 637   Basic Metabolic Panel Recent Labs    07/29/17 1922 07/30/17 0542  NA 141 142  K 4.0 4.0  CL 107 107  CO2 24 23  GLUCOSE 96 92  BUN 16 16  CREATININE 0.77 0.76  CALCIUM 8.9 8.8*  MG 2.0  --    Liver Function Tests Recent Labs    07/29/17 1922 07/30/17 1244  AST 29 32  ALT 30 24  ALKPHOS 57 50  BILITOT 0.5 1.4*  PROT 6.9 6.1*  ALBUMIN 4.1 4.0   No results for input(s): LIPASE, AMYLASE in the last 72 hours. Cardiac Enzymes Recent Labs    07/29/17 1922 07/30/17 0542  TROPONINI <0.03 <0.03   Fasting Lipid Panel Recent Labs    07/30/17 1244  CHOL 159  HDL 77  LDLCALC 73  TRIG 43  CHOLHDL 2.1   Thyroid Function Tests Recent Labs    07/29/17 1922  TSH 1.688   _____________  Dg Chest 2 View  Result Date: 07/29/2017 CLINICAL DATA:  Irregular heart beat and dyspnea. EXAM: CHEST - 2 VIEW COMPARISON:  None. FINDINGS: The heart size and mediastinal contours are within normal limits. Pulmonary hyperinflation, right upper lobe predominant with slight crowding of lower lobe interstitial lung markings. No alveolar consolidation, effusion or  pneumothorax. No pulmonary edema. The visualized skeletal structures are unremarkable. IMPRESSION: Pulmonary hyperinflation, right upper lobe predominant. No acute pneumonic consolidation or CHF. Electronically Signed   By: Ashley Royalty M.D.   On: 07/29/2017 19:31   Disposition   Pt is being discharged home today in good condition.  Follow-up Plans & Appointments    Follow-up Information    Barrett, Evelene Croon, PA-C Follow up on 08/27/2017.   Specialties:  Cardiology, Radiology Why:  Your appointment will be on 08/27/2017 at 2:30 PM with Rosaria Ferries, physician assistant with Dr. Sallyanne Kuster.  Please arrive to your appointment at 2:15 PM.  Thank you Contact information: [address] [place] [address] [phone]          Discharge Instructions    Call MD for:  persistant dizziness or light-headedness   Complete by:  As directed    Diet -  low sodium heart healthy   Complete by:  As directed    Discharge instructions   Complete by:  As directed    If you notice any bleeding such as blood in stool, black tarry stools, blood in urine, nosebleeds or any other unusual bleeding, call your doctor immediately.   Increase activity slowly   Complete by:  As directed      Discharge Medications   Allergies as of 07/30/2017   No Known Allergies     Medication List    STOP taking these medications   naproxen sodium 220 MG tablet Commonly known as:  ALEVE     TAKE these medications   apixaban 5 MG Tabs tablet Commonly known as:  ELIQUIS Take 1 tablet (5 mg total) by mouth 2 (two) times daily.   aspirin 81 MG tablet Take 81 mg by mouth daily.   finasteride 5 MG tablet Commonly known as:  PROSCAR Take 5 mg by mouth daily.   irbesartan 150 MG tablet Commonly known as:  AVAPRO Take 1 tablet (150 mg total) by mouth daily.   metoprolol tartrate 25 MG tablet Commonly known as:  LOPRESSOR Take 1 tablet (25 mg total) by mouth 2 (two) times daily.   Vitamin D3  2000 units Tabs Take by mouth.      Outstanding Labs/Studies   None  Duration of Discharge Encounter   Greater than 30 minutes including physician time.  Signed, Kathyrn Drown NP 07/30/2017, 5:16 PM

## 2017-07-30 NOTE — Discharge Instructions (Signed)

## 2017-07-31 ENCOUNTER — Telehealth: Payer: Self-pay | Admitting: Physician Assistant

## 2017-07-31 ENCOUNTER — Telehealth: Payer: Self-pay | Admitting: Internal Medicine

## 2017-07-31 NOTE — Telephone Encounter (Signed)
Pt notified of Dr [employer] message he will stop the Avapro and continue metoprolol he will take his BP consistently for at least 2 weeks, he will call back if BP becomes abnormal.

## 2017-07-31 NOTE — Telephone Encounter (Signed)
Copied from [place] [id number]. Topic: Quick Communication - See Telephone Encounter >> Jul 31, 2017 12:49 PM Neva Seat wrote: Rennis Golden - w/ [facility]  [phone]  Pt was discharged from hospital. Atrial fibrillation  Please call Loriane to discuss pt care.

## 2017-07-31 NOTE — Telephone Encounter (Signed)
Pt was on the TCM list admitted 07/29/17 for AF. D/C 07/30/17 called pt to make hosp f/u appt. Completed TCM call below.Marland Kitchenlmb  Transition Care Management Follow-up Telephone Call   Date discharged? 07/30/17   How have you been since you were released from the hospital? Pt states he is doing fine today   Do you understand why you were in the hospital? YES   Do you understand the discharge instructions? YES   Where were you discharged to? Home   Items Reviewed:  Medications reviewed: YES, pt states he is no longer taking the Irbesartan. Inform will update on his med list  Allergies reviewed:YES  Dietary changes reviewed: NO  Referrals reviewed: YES, he will be seeing cardiology 08/27/17, he states w/ Dr. Loletha Grayer PA    Functional Questionnaire:   Activities of Daily Living (ADLs):   He states he are independent in the following: ambulation, bathing and hygiene, feeding, continence, grooming, toileting and dressing States he doesn't require assistance    Any transportation issues/concerns?: NO   Any patient concerns? NO   Confirmed importance and date/time of follow-up visits scheduled YES, pt states he will be out of town all next week. Wanting to see [name] [name] only. Made appt 08/17/17. Due to MD being out of the office on 3/28 and 29th.   Provider Appointment booked with [name] [name]   Confirmed with patient if condition begins to worsen call PCP or go to the ER.  Patient was given the office number and encouraged to call back with question or concerns: [place] back w/[facility] no answer LMOM concerning Hosp f/u appt. If need further information, or have concerns pls call bck.Marland KitchenJohny [name]

## 2017-07-31 NOTE — Telephone Encounter (Signed)
Patient calling to discuss medication list was discharged from hospital

## 2017-07-31 NOTE — Consult Note (Signed)
            [facility] CM Primary Care Navigator  07/31/2017  [name] [name] 03-Feb-1951 [id number]   Attempt to seepatient at the bedsideto identify possible discharge needs but he was alreadydischargedper staff report.  Patient was discharged homeyesterday.  PerMD note,patient was admitted for new onset of heart palpitations, described as a "buzzing" feeling in his chest and mild lightheadedness.  Primary care provider's office is listed as providing transition of care (TOC) follow-up.   Primary care provider's office called(Teresa for Lucy B.)to notify of patient's discharge andneed for post hospital follow-up and transition of care (TOC). Notified of health issues needing follow-up (mainly new onset atrial fibrillation with RVR).  Made aware to refer patient to [facility] CM if deemed necessary and appropriate for services.   For additional questions please contact:  Edwena Felty A. April Carlyon, BSN, RN-BC [facility] PRIMARY CARE Navigator Cell: [phone]

## 2017-07-31 NOTE — Telephone Encounter (Signed)
No, I think the metoprolol will be enough for his blood pressure and he does not need to take both.  Stop Avapro.

## 2017-07-31 NOTE — Telephone Encounter (Signed)
Returned call to pt he states that he saw Dr C in the hospital and pt thought that Dr C said to stop avapro. Pt has been discharged from the hospital and Avapro is on his medication list and also to start Eliquis and metoprolol. Should he start Avapro? Please advise

## 2017-08-14 NOTE — Patient Instructions (Addendum)
  Medications reviewed and updated.  No changes recommended at this time.   Please followup in one year   

## 2017-08-14 NOTE — Progress Notes (Signed)
Subjective:    Patient ID: [name] [name], male    DOB: 01-Aug-1950, 67 y.o.   MRN: [id number]  HPI The patient is here for follow up.  Admitted 07/29/17 - 07/30/17 for new onset Afib.   He went to the ED for new onset heart palpitations, buzzing feeling in his chest and mild lightheadedness that started around 6pm.  In the ED a chest x-ray showed pulmonary hyperinflation w/o pneumonia or CHF.  Troponin was negative x 2.  BNP was mildly elevated at 180.  TSH was normal.  His HR ranged from 120-130's.  He was started on diltiazem gtt.  His HR remained elevated throughout the night and next morning.  He was not able to be scheduled for a TEE/DCCV.  Metoprolol was added at 25 g TID.  He then converted to NSR.  His Diltiazem was turned off.  He was started on Eliquis BID.  His BP was well controlled and stable at 113/80, HR in the 55-60 range.  He was discharged home on Metoprolol 25 mg BID and Eliquis BID.   He is taking his medication daily as prescribed.  He feels a little more sleepy and is not sure if it is related to the medication or him just not sleeping enough.  He denies any true fatigue.  He has not had any heart racing or palpitations since leaving the hospital.  He denies chest pain or shortness of breath.  He has been monitoring his heart rate and blood pressure at home.  HR has been 53-65,  BP  118-145/67-83  He is currently not drinking any alcohol.  He is golfing about once a week.  He was going to the gym and typically does the elliptical and weights, but has not been to the gym since being discharged.      Medications and allergies reviewed with patient and updated if appropriate.  Patient Active Problem List   Diagnosis Date Noted  . A-fib ([place]) 07/30/2017  . EtOH dependence ([place]) 07/30/2017  . New onset atrial fibrillation ([place]) 07/29/2017  . Varicose veins of left lower extremity with complications 09/32/6712  . Greater trochanteric bursitis of right hip 01/02/2017  .  Prediabetes 09/20/2016  . Rotator cuff syndrome of right shoulder 09/27/2015  . Vitamin D deficiency 09/07/2014  . Lichenoid keratosis 45/80/9983  . Myalgia and myositis 09/01/2013  . Superficial thrombosis of leg 03/28/2013  . 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 Inguinal hernia recurrent unilateral 08/10/2009  . History of colonic polyps 05/15/2008  . Essential hypertension 05/15/2008  . HYPERLIPIDEMIA 05/12/2007  . Elevated prostate specific antigen (PSA) 05/12/2007    Current Outpatient Medications on File Prior to Visit  Medication Sig Dispense Refill  . apixaban (ELIQUIS) 5 MG TABS tablet Take 1 tablet (5 mg total) by mouth 2 (two) times daily. 60 tablet 0  . aspirin 81 MG tablet Take 81 mg by mouth daily.    . Cholecalciferol (VITAMIN D3) 2000 units TABS Take by mouth.    . finasteride (PROSCAR) 5 MG tablet Take 5 mg by mouth daily.    . metoprolol tartrate (LOPRESSOR) 25 MG tablet Take 1 tablet (25 mg total) by mouth 2 (two) times daily. 120 tablet 3  . irbesartan (AVAPRO) 150 MG tablet Take 1 tablet (150 mg total) by mouth daily. (Patient not taking: Reported on 08/17/2017) 90 tablet 3   No current facility-administered medications on file prior to visit.     Past Medical History:  Diagnosis Date  .  Atrial fibrillation with RVR (HCC)    Converted to NSR with lopressor  . Benign prostatic hypertrophy with elevated PSA    Dr Alinda Money, High grade PIN w/o malignancy  . History of colonic polyps 2003, 2007   hyperplastic  . Hx of colonoscopy 2012   neg  . Hyperlipidemia   . Hypertension   . Retinal vascular occlusion    Dr Delman Cheadle    Past Surgical History:  Procedure Laterality Date  . APPENDECTOMY    . COLONOSCOPY W/ POLYPECTOMY      X2 , Dr Deatra Ina  . HERNIA REPAIR  2009,2011    Dr Margot Chimes  . lichenoid keratosis resected  8/14  . LIPOMA EXCISION     X 5  . PROSTATE BIOPSY  09/2010   Dr Alinda Money    Social History   Socioeconomic History  . Marital status: Married     Spouse name: Not on file  . Number of children: Not on file  . Years of education: Not on file  . Highest education level: Not on file  Occupational History  . Not on file  Social Needs  . Financial resource strain: Not on file  . Food insecurity:    Worry: Not on file    Inability: Not on file  . Transportation needs:    Medical: Not on file    Non-medical: Not on file  Tobacco Use  . Smoking status: Never Smoker  . Smokeless tobacco: Never Used  Substance and Sexual Activity  . Alcohol use: Yes    Alcohol/week: 16.8 oz    Types: 28 Glasses of wine [place] week    Comment:  4 glasses of wine/day  . Drug use: No  . Sexual activity: Not on file  Lifestyle  . Physical activity:    Days Toy week: Not on file    Minutes Claudie session: Not on file  . Stress: Not on file  Relationships  . Social connections:    Talks on phone: Not on file    Gets together: Not on file    Attends religious service: Not on file    Active member of club or organization: Not on file    Attends meetings of clubs or organizations: Not on file    Relationship status: Not on file  Other Topics Concern  . Not on file  Social History Narrative  . Not on file    Family History  Problem Relation Age of Onset  . Alzheimer's disease Mother   . Heart attack Father 10  . Hypertension Brother   . Hypertension Maternal Grandmother   . Diabetes Neg Hx   . Stroke Neg Hx     Review of Systems  Constitutional: Negative for chills and fever.  Respiratory: Negative for shortness of breath.   Cardiovascular: Negative for chest pain, palpitations and leg swelling.  Neurological: Negative for dizziness, light-headedness and headaches.  Hematological: Does not bruise/bleed easily.       Objective:   Vitals:   08/17/17 0957  BP: 130/78  Pulse: (!) 55  Resp: 16  Temp: 98.5 F (36.9 C)  SpO2: 98%   BP Readings from Last 3 Encounters:  08/17/17 130/78  07/30/17 116/77  06/10/17 (!) 156/90   Wt  Readings from Last 3 Encounters:  08/17/17 226 lb (102.5 kg)  07/30/17 220 lb 14.4 oz (100.2 kg)  06/10/17 226 lb (102.5 kg)   Body mass index is 27.51 kg/m.   Physical Exam    Constitutional: Appears  well-developed and well-nourished. No distress.  HENT:  Head: Normocephalic and atraumatic.  Neck: Neck supple. No tracheal deviation present. No thyromegaly present.  No cervical lymphadenopathy Cardiovascular: Normal rate, regular rhythm and normal heart sounds.   No murmur heard. No carotid bruit .  No edema Pulmonary/Chest: Effort normal and breath sounds normal. No respiratory distress. No has no wheezes. No rales.  Skin: Skin is warm and dry. Not diaphoretic.  Psychiatric: Normal mood and affect. Behavior is normal.    Echo 07/30/17: Impressions: - Normal LV size with mild LV hypertrophy. EF 55-60%. Normal RV size and systolic function. No significant valvular abnormalities.   Lab Results  Component Value Date   WBC 6.5 07/30/2017   HGB 14.7 07/30/2017   HCT 44.1 07/30/2017   PLT 191 07/30/2017   GLUCOSE 92 07/30/2017   CHOL 159 07/30/2017   TRIG 43 07/30/2017   HDL 77 07/30/2017   LDLDIRECT 108.9 05/17/2009   LDLCALC 73 07/30/2017   ALT 24 07/30/2017   AST 32 07/30/2017   NA 142 07/30/2017   K 4.0 07/30/2017   CL 107 07/30/2017   CREATININE 0.76 07/30/2017   BUN 16 07/30/2017   CO2 23 07/30/2017   TSH 1.688 07/29/2017   PSA 4.41 (H) 06/14/2010   HGBA1C 5.9 09/19/2016     Assessment & Plan:    See Problem List for Assessment and Plan of chronic medical problems.

## 2017-08-17 ENCOUNTER — Encounter: Payer: Self-pay | Admitting: Internal Medicine

## 2017-08-17 ENCOUNTER — Ambulatory Visit (INDEPENDENT_AMBULATORY_CARE_PROVIDER_SITE_OTHER): Payer: PPO | Admitting: Internal Medicine

## 2017-08-17 VITALS — BP 130/78 | HR 55 | Temp 98.5°F | Resp 16 | Wt 226.0 lb

## 2017-08-17 DIAGNOSIS — I10 Essential (primary) hypertension: Secondary | ICD-10-CM

## 2017-08-17 DIAGNOSIS — I4891 Unspecified atrial fibrillation: Secondary | ICD-10-CM

## 2017-08-17 NOTE — Assessment & Plan Note (Signed)
In sinus rhythm Asymptomatic Heart rate well controlled 53-65 at home Continue current dose of metoprolol and Eliquis Has follow-up with cardiology

## 2017-08-17 NOTE — Assessment & Plan Note (Signed)
On average blood pressure well controlled at home, but does get some higher numbers at times We will continue current medications and see if cardiology needs to make any adjustments He will continue to monitor his blood pressure at home on a daily basis

## 2017-08-27 ENCOUNTER — Ambulatory Visit: Payer: PPO | Admitting: Physician Assistant

## 2017-08-27 ENCOUNTER — Encounter: Payer: Self-pay | Admitting: Physician Assistant

## 2017-08-27 VITALS — BP 140/90 | HR 54 | Ht 76.0 in | Wt 224.8 lb

## 2017-08-27 DIAGNOSIS — Z7901 Long term (current) use of anticoagulants: Secondary | ICD-10-CM | POA: Diagnosis not present

## 2017-08-27 DIAGNOSIS — I480 Paroxysmal atrial fibrillation: Secondary | ICD-10-CM | POA: Diagnosis not present

## 2017-08-27 NOTE — Progress Notes (Signed)
Cardiology Office Note   Date:  08/27/2017   ID:  [name] [name], DOB Dec 11, 1950, MRN [id number]  PCP:  Binnie Rail, MD  Cardiologist: Dr. Sallyanne Kuster, 07/30/2017 in hospital Rosaria Ferries, PA-C   No chief complaint on file.   History of Present Illness: [name] [name] is a 67 y.o. male with a history of HTN, HLD, ETOH use  Admitted 3/13-3/14/2019 for atrial fibrillation, RVR, TSH normal, spontaneous conversion to sinus rhythm on metoprolol, Eliquis added, Avapro stopped by phone  [name] [name] presents for cardiology follow up.   He is checking his BP 2 x day. On his records, in March, some SBP readings were in the 140s, but all in April have been 120s-130s. HR has ranged from 49-67.   No chest pain, no DOE. Plays golf and does yard work. Has to leave the cart on the path, so walks more.  He gets a little sleepy when he sits still, does not feel limited in any way by his low heart rate.   His BP has been ok off the Avapro.   No palpitations. He was instantly aware that something was wrong when he had the atrial fibrillation, has not had those symptoms since.  He is still drinking a small amount of alcohol, does not feel that he has alcohol dependency. No ETOH withdrawal sx.    Past Medical History:  Diagnosis Date  . Atrial fibrillation with RVR (HCC)    Converted to NSR with lopressor  . Benign prostatic hypertrophy with elevated PSA    Dr Alinda Money, High grade PIN w/o malignancy  . History of colonic polyps 2003, 2007   hyperplastic  . Hx of colonoscopy 2012   neg  . Hyperlipidemia   . Hypertension   . Retinal vascular occlusion    Dr Delman Cheadle    Past Surgical History:  Procedure Laterality Date  . APPENDECTOMY    . COLONOSCOPY W/ POLYPECTOMY      X2 , Dr Deatra Ina  . HERNIA REPAIR  2009,2011    Dr Margot Chimes  . lichenoid keratosis resected  8/14  . LIPOMA EXCISION     X 5  . PROSTATE BIOPSY  09/2010   Dr Alinda Money    Current Outpatient Medications  Medication  Sig Dispense Refill  . apixaban (ELIQUIS) 5 MG TABS tablet Take 1 tablet (5 mg total) by mouth 2 (two) times daily. 60 tablet 0  . Cholecalciferol (VITAMIN D3) 2000 units TABS Take by mouth.    . finasteride (PROSCAR) 5 MG tablet Take 5 mg by mouth daily.    . metoprolol tartrate (LOPRESSOR) 25 MG tablet Take 1 tablet (25 mg total) by mouth 2 (two) times daily. 120 tablet 3   No current facility-administered medications for this visit.     Allergies:   Patient has no known allergies.    Social History:  The patient  reports that he has never smoked. He has never used smokeless tobacco. He reports that he drinks about 1.8 oz of alcohol [name] [name]. He reports that he does not use drugs.   Family History:  The patient's family history includes Alzheimer's disease in his mother; Heart attack (age of onset: 90) in his father; Hypertension in his brother and maternal grandmother.    ROS:  Please see the history of present illness. All other systems are reviewed and negative.    PHYSICAL EXAM: VS:  BP 140/90 (BP Location: Right Arm)   Pulse (!) 54   Ht  6\' 4"  (1.93 m)   Wt 224 lb 12.8 oz (102 kg)   BMI 27.36 kg/m  , BMI Body mass index is 27.36 kg/m. GEN: Well nourished, well developed, male in no acute distress  HEENT: normal for age  Neck: no JVD, no carotid bruit, no masses Cardiac: RRR; soft murmur, no rubs, or gallops Respiratory:  clear to auscultation bilaterally, normal work of breathing GI: soft, nontender, nondistended, + BS MS: no deformity or atrophy; no edema; distal pulses are 2+ in all 4 extremities   Skin: warm and dry, no rash Neuro:  Strength and sensation are intact Psych: euthymic mood, full affect   EKG:  EKG is not ordered today.  Echocardiogram 07/30/2017: Study Conclusions - Left ventricle: The cavity size was normal. Wall thickness was increased in a pattern of mild LVH. Systolic function was normal. The estimated ejection fraction was in the range of  55% to 60%. Wall motion was normal; there were no regional wall motion abnormalities. Doppler parameters are consistent with abnormal left ventricular relaxation (grade 1 diastolic dysfunction). - Aortic valve: There was no stenosis. There was trivial regurgitation. - Aorta: Mildly dilated aortic root. Aortic root dimension: 39 mm(ED). - Mitral valve: Mildly calcified annulus. Mildly calcified leaflets . There was trivial regurgitation. - Right ventricle: The cavity size was normal. Systolic function was normal. - Pulmonary arteries: No complete TR doppler jet so unable to estimate PA systolic pressure. - Systemic veins: IVC poorly visualized. Impressions: - Normal LV size with mild LV hypertrophy. EF 55-60%. Normal RV size and systolic function. No significant valvular abnormalities.  Recent Labs: 07/29/2017: Magnesium 2.0; TSH 1.688 07/30/2017: ALT 24; B Natriuretic Peptide 180.5; BUN 16; Creatinine, Ser 0.76; Hemoglobin 14.7; Platelets 191; Potassium 4.0; Sodium 142    Lipid Panel    Component Value Date/Time   CHOL 159 07/30/2017 1244   TRIG 43 07/30/2017 1244   HDL 77 07/30/2017 1244   CHOLHDL 2.1 07/30/2017 1244   VLDL 9 07/30/2017 1244   LDLCALC 73 07/30/2017 1244   LDLDIRECT 108.9 05/17/2009 0000     Wt Readings from Last 3 Encounters:  08/27/17 224 lb 12.8 oz (102 kg)  08/17/17 226 lb (102.5 kg)  07/30/17 220 lb 14.4 oz (100.2 kg)     Other studies Reviewed: Additional studies/ records that were reviewed today include: hospital records and testing.  ASSESSMENT AND PLAN:  1.  PAF: No recurrence since his discharge.  He has moderated his alcohol intake.  He is compliant with his metoprolol and Eliquis. -Heart rate is in the high 40s at times, but he is not very symptomatic with this. -I advised that if he feels the fatigue is worsening then let us know, we can try different beta-blocker. -However, since he is maintaining sinus rhythm I am  reluctant to make changes and he is in agreement with this.  2.  Chronic anticoagulation: -No bleeding on the Eliquis. - CHA2DS2VASc=2 (age, HTN)   Current medicines are reviewed at length with the patient today.  The patient does not have concerns regarding medicines.  The following changes have been made:  no change  Labs/ tests ordered today include:  No orders of the defined types were placed in this encounter.    Disposition:   FU with Dr. Sallyanne Kuster in 6 months  Signed, Rosaria Ferries, PA-C  08/27/2017 5:15 PM    [facility] Phone: [phone]; Fax: [phone]  This note was written with the assistance of speech  recognition software. Please excuse any transcriptional errors.

## 2017-08-27 NOTE — Patient Instructions (Signed)
Continue same medications    Call office if you develop frequent episodes of AFib,chest pain,shortness of breath and if Blood pressure greater than 130/80     Your physician wants you to follow-up in: 6 months with Dr.Croitoru. You will receive a reminder letter in the mail two months in advance. If you don't receive a letter, please call our office to schedule the follow-up appointment.

## 2017-08-28 NOTE — Progress Notes (Signed)
Thanks, I agree that we can tolerate that minimally symptomatic bradycardia. MCr

## 2017-09-07 ENCOUNTER — Ambulatory Visit (INDEPENDENT_AMBULATORY_CARE_PROVIDER_SITE_OTHER): Payer: PPO | Admitting: Vascular Surgery

## 2017-09-07 ENCOUNTER — Encounter: Payer: Self-pay | Admitting: Vascular Surgery

## 2017-09-07 ENCOUNTER — Other Ambulatory Visit: Payer: Self-pay

## 2017-09-07 VITALS — BP 118/73 | HR 60 | Resp 20 | Ht 76.0 in | Wt 220.0 lb

## 2017-09-07 DIAGNOSIS — I83892 Varicose veins of left lower extremities with other complications: Secondary | ICD-10-CM

## 2017-09-07 NOTE — Progress Notes (Signed)
Subjective:     Patient ID: [name] [name], male   DOB: 04/08/1951, 67 y.o.   MRN: [id number]  HPI This 68 year old male returns for 67-month follow-up regarding his painful varicosities in the left leg with 2 previous episodes of superficial thrombophlebitis.  He was found to have gross reflux in the left great saphenous vein which was significantly enlarged and some thrombosed varicose veins in the left calf.  He has tried long-leg elastic compression stockings-20-30 mm gradient as well as elevation and ibuprofen with no improvement in his symptoms.  He does desire treatment to prevent further complications.  He is currently on Eliquis for atrial fibrillation which occurred a few months ago but he has had no complications from that.  Past Medical History:  Diagnosis Date  . Atrial fibrillation with RVR (HCC)    Converted to NSR with lopressor  . Benign prostatic hypertrophy with elevated PSA    Dr Alinda Money, High grade PIN w/o malignancy  . History of colonic polyps 2003, 2007   hyperplastic  . Hx of colonoscopy 2012   neg  . Hyperlipidemia   . Hypertension   . Retinal vascular occlusion    Dr Delman Cheadle    Social History   Tobacco Use  . Smoking status: Never Smoker  . Smokeless tobacco: Never Used  Substance Use Topics  . Alcohol use: Yes    Alcohol/week: 1.8 oz    Types: 3 Glasses of wine [place] week    Comment:  4 glasses of wine/day in the past, < 1/day now    Family History  Problem Relation Age of Onset  . Alzheimer's disease Mother   . Heart attack Father 37  . Hypertension Brother   . Hypertension Maternal Grandmother   . Diabetes Neg Hx   . Stroke Neg Hx     No Known Allergies   Current Outpatient Medications:  .  apixaban (ELIQUIS) 5 MG TABS tablet, Take 1 tablet (5 mg total) by mouth 2 (two) times daily., Disp: 60 tablet, Rfl: 0 .  Cholecalciferol (VITAMIN D3) 2000 units TABS, Take by mouth., Disp: , Rfl:  .  finasteride (PROSCAR) 5 MG tablet, Take 5 mg by mouth  daily., Disp: , Rfl:  .  metoprolol tartrate (LOPRESSOR) 25 MG tablet, Take 1 tablet (25 mg total) by mouth 2 (two) times daily., Disp: 120 tablet, Rfl: 3  Vitals:   09/07/17 1357  BP: 118/73  Pulse: 60  Resp: 20  SpO2: 95%  Weight: 220 lb (99.8 kg)  Height: 6\' 4"  (1.93 m)    Body mass index is 26.78 kg/m.         Review of Systems Denies chest pain, dyspnea on exertion, PND, orthopnea.  See HPI    Objective:   Physical Exam BP 118/73 (BP Location: Left Arm, Patient Position: Sitting, Cuff Size: Normal)   Pulse 60   Resp 20   Ht 6\' 4"  (1.93 m)   Wt 220 lb (99.8 kg)   SpO2 95%   BMI 26.78 kg/m   Well-developed well-nourished male no apparent distress alert and oriented x3 Lungs no rhonchi or wheezing Left leg with bulging varicosities beginning at the knee level extending into the medial calf down toward the medial malleolus with no active ulceration.  Trace edema distally on the left.  No bulging varicosities in the right lower extremity.  Today I reviewed the previous venous duplex exam which was performed June 09, 2017 which reveals gross reflux in the left great saphenous vein  which is enlarged and is supplying these painful varicosities and no DVT.  Some thrombosed varicosities were visualized in the distal thigh.    Assessment:     Painful varicosities left leg with previous history of thrombophlebitis on 2 occasions with gross reflux left great saphenous vein causing symptoms which are affecting patient's daily living and have caused previous complication.  This is resistant to conservative measures including a long-leg elastic compression stockings, elevation, and ibuprofen Atrial fibrillation with chronic anticoagulation on Eliquis    Plan:     Patient needs laser ablation left great saphenous vein followed by 76-month waiting.  To be then evaluated for possible stab phlebectomy of painful varicosities He will need to have Eliquis discontinued for 2 days  prior to the procedure We will proceed with precertification perform this in the near future and hopefully prevent further complications

## 2017-09-09 ENCOUNTER — Telehealth: Payer: Self-pay | Admitting: *Deleted

## 2017-09-09 NOTE — Telephone Encounter (Signed)
   [facility] Pre-operative Risk Assessment    Request for surgical clearance:  1. What type of surgery is being performed? ENDOVENOUS LASER ABLATION OF LEFT GREATER SAPHENOUS VEIN   2. When is this surgery scheduled? 09-29-2017   3. What type of clearance is required (medical clearance vs. Pharmacy clearance to hold med vs. Both)? BOTH  4. Are there any medications that need to be held prior to surgery and how long?ELIQUIS- 2 DAYS PRIOR TO PROCEDURE, DAY OF PROCEDURE AND 2 DAYS FOLLOWING THE PROCEDURE   5. Practice name and name of physician performing surgery? DR Jeneen Rinks [facility]   6. What is your office phone number [phone]    7.   What is your office fax number [phone]  8.   Anesthesia type (None, local, MAC, general) ? NOT LISTED   Fredia Beets 09/09/2017, 2:43 PM  _________________________________________________________________   (provider comments below)

## 2017-09-09 NOTE — Telephone Encounter (Signed)
Should be ok to hold Eliquis 2 days prior to vein ablation - of note, I did these procedures on Eliquis without issues. But if he prefers to stop it, that is fine.  Dr. Debara Pickett (for Dr. Loletha Grayer)

## 2017-09-09 NOTE — Telephone Encounter (Signed)
Pt with PAF cardioverted (spontaneously) 4 weeks ago.  ? OK to hold Eliquis for vein ablation.  Kerin Ransom PA-C 09/09/2017 4:43 PM

## 2017-09-10 DIAGNOSIS — R3915 Urgency of urination: Secondary | ICD-10-CM | POA: Diagnosis not present

## 2017-09-10 DIAGNOSIS — N401 Enlarged prostate with lower urinary tract symptoms: Secondary | ICD-10-CM | POA: Diagnosis not present

## 2017-09-10 DIAGNOSIS — R3912 Poor urinary stream: Secondary | ICD-10-CM | POA: Diagnosis not present

## 2017-09-10 NOTE — Telephone Encounter (Signed)
   Primary Cardiologist: Sanda Klein, MD  Chart was reviewed as part of pre-operative protocol coverage. H/o HTN, HLD, ETOH, PAF. Patient was contacted 09/10/2017 in reference to pre-operative risk assessment for pending surgery as outlined below. [name] [name] was last seen on 08/27/17 by Rosaria Ferries PA-C. Since that day, [name] [name] has done well without any recurrent symptoms - he reports that his heart rate has been generally running 60s without any symptoms of recurrent atrial fib.  Dr. Debara Pickett responded to question regarding Eliquis as Dr. Sallyanne Kuster is out of the office. [place] his note, "Should be ok to hold Eliquis 2 days prior to vein ablation - of note, I did these procedures on Eliquis without issues. But if he prefers to stop it, that is fine. Dr. Debara Pickett (for Dr. Loletha Grayer)" We generally recommend to resume as soon as possible when felt safe by provider performing procedure.  I will route this recommendation to the requesting party via Epic fax function and remove from pre-op pool.  Please call with questions.  [name] Pitter, PA-C 09/10/2017, 3:38 PM

## 2017-09-11 ENCOUNTER — Encounter: Payer: Self-pay | Admitting: Internal Medicine

## 2017-09-14 ENCOUNTER — Telehealth: Payer: Self-pay | Admitting: *Deleted

## 2017-09-14 NOTE — Telephone Encounter (Signed)
Pt sent email stating needing to add new Silodosin 8 mg to his medication list. Was rx by MD form [facility] " Dr. Jiles Crocker".Marland KitchenJohny [name]

## 2017-09-23 ENCOUNTER — Other Ambulatory Visit: Payer: Self-pay | Admitting: *Deleted

## 2017-09-23 DIAGNOSIS — I83892 Varicose veins of left lower extremities with other complications: Secondary | ICD-10-CM

## 2017-09-28 DIAGNOSIS — N401 Enlarged prostate with lower urinary tract symptoms: Secondary | ICD-10-CM | POA: Diagnosis not present

## 2017-09-29 ENCOUNTER — Ambulatory Visit: Payer: PPO | Admitting: Vascular Surgery

## 2017-09-29 ENCOUNTER — Encounter: Payer: Self-pay | Admitting: Vascular Surgery

## 2017-09-29 VITALS — BP 138/79 | HR 53 | Temp 98.0°F | Resp 18 | Ht 76.0 in | Wt 224.0 lb

## 2017-09-29 DIAGNOSIS — I83892 Varicose veins of left lower extremities with other complications: Secondary | ICD-10-CM

## 2017-09-29 DIAGNOSIS — I868 Varicose veins of other specified sites: Secondary | ICD-10-CM

## 2017-09-29 HISTORY — PX: ENDOVENOUS ABLATION SAPHENOUS VEIN W/ LASER: SUR449

## 2017-09-29 NOTE — Progress Notes (Signed)
Laser Ablation Procedure    Date: 09/29/2017   [name] [name] DOB:1950/07/22  Consent signed: Yes    Surgeon:  Dr. Nelda Severe. Kellie Simmering  Procedure: Laser Ablation: left Greater Saphenous Vein  BP 138/79 (BP Location: Left Arm, Patient Position: Sitting, Cuff Size: Normal)   Pulse (!) 53   Temp 98 F (36.7 C) (Oral)   Resp 18   Ht 6\' 4"  (1.93 m)   Wt 224 lb (101.6 kg)   SpO2 98%   BMI 27.27 kg/m   Tumescent Anesthesia: 475 cc 0.9% NaCl with 50 cc Lidocaine HCL 1% and 15 cc 8.4% NaHCO3  Local Anesthesia: 6 cc Lidocaine HCL and NaHCO3 (ratio 2:1)  Pulsed Mode: 15 watts, 535ms delay, 1.0 duration  Total Energy: 2556 Joules             Total Pulses:172                Total Time: 2:51      Patient tolerated procedure well  Notes: Last dose of Eliquis 09-26-2017  Description of Procedure:  After marking the course of the secondary varicosities, the patient was placed on the operating table in the supine position, and the left leg was prepped and draped in sterile fashion.   Local anesthetic was administered and under ultrasound guidance the saphenous vein was accessed with a micro needle and guide wire; then the mirco puncture sheath was placed.  A guide wire was inserted saphenofemoral junction , followed by a 5 french sheath.  The position of the sheath and then the laser fiber below the junction was confirmed using the ultrasound.  Tumescent anesthesia was administered along the course of the saphenous vein using ultrasound guidance. The patient was placed in Trendelenburg position and protective laser glasses were placed on patient and staff, and the laser was fired at 15 watts continuous mode advancing 1-40mm/second for a total of 2556 joules.    Diamond Nickel strip was applied to the IV insertion site and ABD pads and thigh high compression stockings were applied.  Ace wrap bandages were applied over the left thigh and calf and at the top of the saphenofemoral junction. Blood loss was  less than 15 cc.  The patient ambulated out of the operating room having tolerated the procedure well.

## 2017-09-29 NOTE — Progress Notes (Signed)
  Subjective:     Patient ID: [name] [name], male   DOB: 11-Jan-1951, 67 y.o.   MRN: [id number]  HPI This 67 year old male had laser ablation of the left great saphenous vein from the proximal calf to near the saphenofemoral junction performed under local tumescent anesthesia.  A total of 2556 J of energy was utilized.  He tolerated the procedure well.  Review of Systems     Objective:   Physical Exam BP 138/79 (BP Location: Left Arm, Patient Position: Sitting, Cuff Size: Normal)   Pulse (!) 53   Temp 98 F (36.7 C) (Oral)   Resp 18   Ht 6\' 4"  (1.93 m)   Wt 224 lb (101.6 kg)   SpO2 98%   BMI 27.27 kg/m        Assessment:     Well-tolerated laser ablation left great saphenous vein performed under local tumescent anesthesia    Plan:     Patient will return in 1 week for venous duplex exam to confirm closure left great saphenous vein

## 2017-10-02 DIAGNOSIS — N401 Enlarged prostate with lower urinary tract symptoms: Secondary | ICD-10-CM | POA: Diagnosis not present

## 2017-10-02 DIAGNOSIS — R972 Elevated prostate specific antigen [PSA]: Secondary | ICD-10-CM | POA: Diagnosis not present

## 2017-10-02 DIAGNOSIS — N5201 Erectile dysfunction due to arterial insufficiency: Secondary | ICD-10-CM | POA: Diagnosis not present

## 2017-10-02 DIAGNOSIS — R3915 Urgency of urination: Secondary | ICD-10-CM | POA: Diagnosis not present

## 2017-10-06 ENCOUNTER — Ambulatory Visit (INDEPENDENT_AMBULATORY_CARE_PROVIDER_SITE_OTHER): Payer: PPO | Admitting: Vascular Surgery

## 2017-10-06 ENCOUNTER — Encounter: Payer: Self-pay | Admitting: Vascular Surgery

## 2017-10-06 ENCOUNTER — Other Ambulatory Visit: Payer: Self-pay

## 2017-10-06 ENCOUNTER — Ambulatory Visit (HOSPITAL_COMMUNITY)
Admission: RE | Admit: 2017-10-06 | Discharge: 2017-10-06 | Disposition: A | Discharge status: Home / Self Care | Payer: PPO | Source: Ambulatory Visit | Attending: Vascular Surgery | Admitting: Vascular Surgery

## 2017-10-06 VITALS — BP 129/77 | HR 63 | Resp 20 | Ht 76.0 in | Wt 224.0 lb

## 2017-10-06 DIAGNOSIS — Z9889 Other specified postprocedural states: Secondary | ICD-10-CM | POA: Insufficient documentation

## 2017-10-06 DIAGNOSIS — I83892 Varicose veins of left lower extremities with other complications: Secondary | ICD-10-CM

## 2017-10-06 DIAGNOSIS — M7989 Other specified soft tissue disorders: Secondary | ICD-10-CM | POA: Insufficient documentation

## 2017-10-06 NOTE — Progress Notes (Signed)
Subjective:     Patient ID: [name] [name], male   DOB: 1950-10-09, 67 y.o.   MRN: [id number]  HPI This 67 year old male returns 1 week follow-up regarding laser ablation left great saphenous vein for gross reflux with painful varicosities. He has worn his elastic compression stockings 20-30 millimeter gradient as instructed and taken ibuprofen for discomfort. He states that the discomfort in the thigh is rapidly resolving. He thinks that the left lower leg feels less tight. He has no complaints.  Past Medical History:  Diagnosis Date  . Atrial fibrillation with RVR (HCC)    Converted to NSR with lopressor  . Benign prostatic hypertrophy with elevated PSA    Dr Alinda Money, High grade PIN w/o malignancy  . History of colonic polyps 2003, 2007   hyperplastic  . Hx of colonoscopy 2012   neg  . Hyperlipidemia   . Hypertension   . Retinal vascular occlusion    Dr Delman Cheadle    Social History   Tobacco Use  . Smoking status: Never Smoker  . Smokeless tobacco: Never Used  Substance Use Topics  . Alcohol use: Yes    Alcohol/week: 1.8 oz    Types: 3 Glasses of wine [place] week    Comment:  4 glasses of wine/day in the past, < 1/day now    Family History  Problem Relation Age of Onset  . Alzheimer's disease Mother   . Heart attack Father 80  . Hypertension Brother   . Hypertension Maternal Grandmother   . Diabetes Neg Hx   . Stroke Neg Hx     No Known Allergies   Current Outpatient Medications:  .  apixaban (ELIQUIS) 5 MG TABS tablet, Take 1 tablet (5 mg total) by mouth 2 (two) times daily., Disp: 60 tablet, Rfl: 0 .  Cholecalciferol (VITAMIN D3) 2000 units TABS, Take by mouth., Disp: , Rfl:  .  finasteride (PROSCAR) 5 MG tablet, Take 5 mg by mouth daily., Disp: , Rfl:  .  metoprolol tartrate (LOPRESSOR) 25 MG tablet, Take 1 tablet (25 mg total) by mouth 2 (two) times daily., Disp: 120 tablet, Rfl: 3 .  silodosin (RAPAFLO) 8 MG CAPS capsule, Take 8 mg by mouth daily with breakfast.,  Disp: , Rfl:   Vitals:   10/06/17 1430  BP: 129/77  Pulse: 63  Resp: 20  SpO2: 97%  Weight: 224 lb (101.6 kg)  Height: 6\' 4"  (1.93 m)    Body mass index is 27.27 kg/m.         Review of Systems Denies chest pain, dyspnea on exertion, PND, orthopnea, hemoptysis, claudication    Objective:   Physical Exam BP 129/77 (BP Location: Left Arm, Patient Position: Sitting, Cuff Size: Normal)   Pulse 63   Resp 20   Ht 6\' 4"  (1.93 m)   Wt 224 lb (101.6 kg)   SpO2 97%   BMI 27.27 kg/m   Gen. well-developed well-nourished male no apparent distress alert and oriented 3 Lungs no rhonchi or wheezing Left leg with mild discomfort to deep palpation in the medial thigh up to the inguinal crease. Mild to moderate ecchymosis resolving in the mid thigh. Varicosities still visible below the knee in the medial calf. No distal edema noted.  Today I ordered a venous duplex exam the left leg which I reviewed and interpreted. There is no DVT. There is total closure of the left great saphenous vein up to near the saphenofemoral junction     Assessment:     Successful  laser ablation left great saphenous vein for gross reflux with painful varicosities    Plan:     Patient will return in 3 months to be evaluated for possible stab phlebectomy of residual painful varicosities

## 2017-10-07 DIAGNOSIS — L080 Pyoderma: Secondary | ICD-10-CM | POA: Diagnosis not present

## 2017-10-07 DIAGNOSIS — D485 Neoplasm of uncertain behavior of skin: Secondary | ICD-10-CM | POA: Diagnosis not present

## 2017-10-07 DIAGNOSIS — L0889 Other specified local infections of the skin and subcutaneous tissue: Secondary | ICD-10-CM | POA: Diagnosis not present

## 2017-10-07 DIAGNOSIS — B4289 Other forms of sporotrichosis: Secondary | ICD-10-CM | POA: Diagnosis not present

## 2017-10-22 DIAGNOSIS — H47292 Other optic atrophy, left eye: Secondary | ICD-10-CM | POA: Diagnosis not present

## 2017-10-24 NOTE — Progress Notes (Signed)
[name] [place] [address] [place], [place] [address] Phone: [phone] Subjective:    I'm seeing this patient by the request  of:    CC: Right shoulder pain, right hip pain  XNA:[id number]  [name] [name] is a 67 y.o. male coming in with complaint of right shoulder pain.  Patient has had more of a bursitis in the right shoulder previously.  Did respond very well to conservative therapy.  Injected 10 months ago.  Started having worsening pain again.  Started to wake him up at night.  No weakness.  No numbness.  Tolerable but it is worsening.  Patient is also having right hip pain.  Found to have greater trochanteric bursitis.  Given injection 8 months ago.  Had been pain-free and has not taken any pain medicine.  Patient will be traveling long distance and will be out of the country for multiple weeks.  Has not tried anything else at this time or any home modalities.     Past Medical History:  Diagnosis Date  . Atrial fibrillation with RVR (HCC)    Converted to NSR with lopressor  . Benign prostatic hypertrophy with elevated PSA    Dr Alinda Money, High grade PIN w/o malignancy  . History of colonic polyps 2003, 2007   hyperplastic  . Hx of colonoscopy 2012   neg  . Hyperlipidemia   . Hypertension   . Retinal vascular occlusion    Dr Delman Cheadle   Past Surgical History:  Procedure Laterality Date  . APPENDECTOMY    . COLONOSCOPY W/ POLYPECTOMY      X2 , Dr Deatra Ina  . ENDOVENOUS ABLATION SAPHENOUS VEIN W/ LASER Left 09/29/2017   endovenous laser ablation left greater saphenous vein by Tinnie Gens MD   . HERNIA REPAIR  [phone]    Dr Margot Chimes  . lichenoid keratosis resected  8/14  . LIPOMA EXCISION     X 5  . PROSTATE BIOPSY  09/2010   Dr Alinda Money   Social History   Socioeconomic History  . Marital status: Married    Spouse name: Not on file  . Number of children: Not on file  . Years of education: Not on file  . Highest education level: Not on file    Occupational History  . Not on file  Social Needs  . Financial resource strain: Not on file  . Food insecurity:    Worry: Not on file    Inability: Not on file  . Transportation needs:    Medical: Not on file    Non-medical: Not on file  Tobacco Use  . Smoking status: Never Smoker  . Smokeless tobacco: Never Used  Substance and Sexual Activity  . Alcohol use: Yes    Alcohol/week: 1.8 oz    Types: 3 Glasses of wine [place] week    Comment:  4 glasses of wine/day in the past, < 1/day now  . Drug use: No  . Sexual activity: Not on file  Lifestyle  . Physical activity:    Days [place] week: Not on file    Minutes Reg session: Not on file  . Stress: Not on file  Relationships  . Social connections:    Talks on phone: Not on file    Gets together: Not on file    Attends religious service: Not on file    Active member of club or organization: Not on file    Attends meetings of clubs or organizations: Not on file  Relationship status: Not on file  Other Topics Concern  . Not on file  Social History Narrative  . Not on file   No Known Allergies Family History  Problem Relation Age of Onset  . Alzheimer's disease Mother   . Heart attack Father 10  . Hypertension Brother   . Hypertension Maternal Grandmother   . Diabetes Neg Hx   . Stroke Neg Hx      Past medical history, social, surgical and family history all reviewed in electronic medical record.  No pertanent information unless stated regarding to the chief complaint.   Review of Systems:Review of systems updated and as accurate as of 10/26/17  No headache, visual changes, nausea, vomiting, diarrhea, constipation, dizziness, abdominal pain, skin rash, fevers, chills, night sweats, weight loss, swollen lymph nodes, body aches, joint swelling, muscle aches, chest pain, shortness of breath, mood changes.   Objective  Blood pressure 140/90, pulse (!) 57, height 6\' 4"  (1.93 m), weight 227 lb (103 kg), SpO2 96 %. Systems  examined below as of 10/26/17   General: No apparent distress alert and oriented x3 mood and affect normal, dressed appropriately.  HEENT: Pupils equal, extraocular movements intact  Respiratory: Patient's speak in full sentences and does not appear short of breath  Cardiovascular: No lower extremity edema, non tender, no erythema  Skin: Warm dry intact with no signs of infection or rash on extremities or on axial skeleton.  Abdomen: Soft nontender  Neuro: Cranial nerves II through XII are intact, neurovascularly intact in all extremities with 2+ DTRs and 2+ pulses.  Lymph: No lymphadenopathy of posterior or anterior cervical chain or axillae bilaterally.  Gait normal with good balance and coordination.  MSK:  Non tender with full range of motion and good stability and symmetric strength and tone of , elbows, wrist,  knee and ankles bilaterally.  Mild arthritic changes of multiple joints  Right shoulder shows mild positive impingement with Neer's and Hawkins.  Rotator cuff strength intact and symmetric to the contralateral side.  Neurovascular intact distally.  Full range of motion of the shoulder.  Inspection no abnormality  Right hip exam shows severe tenderness to palpation over the greater trochanteric area.  Positive Faber test.  Negative straight leg test.  Procedure: Real-time Ultrasound Guided Injection of right glenohumeral joint Device: GE Logiq Q7  Ultrasound guided injection is preferred based studies that show increased duration, increased effect, greater accuracy, decreased procedural pain, increased response rate with ultrasound guided versus blind injection.  Verbal informed consent obtained.  Time-out conducted.  Noted no overlying erythema, induration, or other signs of local infection.  Skin prepped in a sterile fashion.  Local anesthesia: Topical Ethyl chloride.  With sterile technique and under real time ultrasound guidance:  Joint visualized.  23g 1  inch needle  inserted posterior approach. Pictures taken for needle placement. Patient did have injection of 2 cc of 1% lidocaine, 2 cc of 0.5% Marcaine, and 1.0 cc of Kenalog 40 mg/dL. Completed without difficulty  Pain immediately resolved suggesting accurate placement of the medication.  Advised to call if fevers/chills, erythema, induration, drainage, or persistent bleeding.  Images permanently stored and available for review in the ultrasound unit.  Impression: Technically successful ultrasound guided injection.   Procedure: Real-time Ultrasound Guided Injection of right greater trochanteric bursitis secondary to patient's body habitus Device: GE Logiq Q7 Ultrasound guided injection is preferred based studies that show increased duration, increased effect, greater accuracy, decreased procedural pain, increased response rate, and decreased cost with  ultrasound guided versus blind injection.  Verbal informed consent obtained.  Time-out conducted.  Noted no overlying erythema, induration, or other signs of local infection.  Skin prepped in a sterile fashion.  Local anesthesia: Topical Ethyl chloride.  With sterile technique and under real time ultrasound guidance:  Greater trochanteric area was visualized and patient's bursa was noted. A 22-gauge 3 inch needle was inserted and 4 cc of 0.5% Marcaine and 1 cc of Kenalog 40 mg/dL was injected. Pictures taken Completed without difficulty  Pain immediately resolved suggesting accurate placement of the medication.  Advised to call if fevers/chills, erythema, induration, drainage, or persistent bleeding.  Images permanently stored and available for review in the ultrasound unit.  Impression: Technically successful ultrasound guided injection.    Impression and Recommendations:     This case required medical decision making of moderate complexity.      Note: This dictation was prepared with Dragon dictation along with smaller phrase technology. Any  transcriptional errors that result from this process are unintentional.

## 2017-10-26 ENCOUNTER — Encounter: Payer: Self-pay | Admitting: Family Medicine

## 2017-10-26 ENCOUNTER — Ambulatory Visit: Payer: Self-pay

## 2017-10-26 ENCOUNTER — Ambulatory Visit (INDEPENDENT_AMBULATORY_CARE_PROVIDER_SITE_OTHER): Payer: PPO | Admitting: Family Medicine

## 2017-10-26 VITALS — BP 140/90 | HR 57 | Ht 76.0 in | Wt 227.0 lb

## 2017-10-26 DIAGNOSIS — M25511 Pain in right shoulder: Secondary | ICD-10-CM

## 2017-10-26 DIAGNOSIS — M7061 Trochanteric bursitis, right hip: Secondary | ICD-10-CM | POA: Diagnosis not present

## 2017-10-26 DIAGNOSIS — G8929 Other chronic pain: Secondary | ICD-10-CM

## 2017-10-26 DIAGNOSIS — M75101 Unspecified rotator cuff tear or rupture of right shoulder, not specified as traumatic: Secondary | ICD-10-CM | POA: Diagnosis not present

## 2017-10-26 NOTE — Assessment & Plan Note (Signed)
Given injection today.  Tolerated procedure well.  Discussed icing regimen and home exercise.  Discussed which activities of doing which wants to avoid.  Follow-up again as needed

## 2017-10-26 NOTE — Assessment & Plan Note (Signed)
Patient given injection.  Tolerated the procedure well.  Discussed icing regimen and home exercises.  Discussed which activities of doing which was to avoid.  Follow-up again as needed

## 2017-10-26 NOTE — Patient Instructions (Signed)
Good to see you  We injected both the shoulder and the hip today  You know the drill  Stay active See me when you need me

## 2017-12-06 ENCOUNTER — Encounter: Payer: Self-pay | Admitting: Family Medicine

## 2017-12-24 ENCOUNTER — Ambulatory Visit: Payer: PPO | Admitting: Vascular Surgery

## 2017-12-30 ENCOUNTER — Encounter: Payer: Self-pay | Admitting: Vascular Surgery

## 2017-12-30 ENCOUNTER — Ambulatory Visit (INDEPENDENT_AMBULATORY_CARE_PROVIDER_SITE_OTHER): Payer: PPO | Admitting: Vascular Surgery

## 2017-12-30 VITALS — BP 124/76 | HR 53 | Resp 16 | Ht 76.0 in | Wt 222.0 lb

## 2017-12-30 DIAGNOSIS — I83812 Varicose veins of left lower extremities with pain: Secondary | ICD-10-CM | POA: Diagnosis not present

## 2017-12-30 NOTE — Progress Notes (Addendum)
Patient is a 67 year old male who returns for follow-up today.  He previously underwent left greater saphenous laser ablation by my partner Dr. Kellie Simmering about 3 months ago.  He returns today for follow-up for consideration of stab avulsions after the previous laser procedure.  He states that currently he has been very satisfied that his swelling symptoms went away.  He does not really describe any achiness or heaviness in the leg.  He has no ulcerations.  He is essentially asymptomatic at this point other than the appearance of the veins.  Past Medical History:  Diagnosis Date  . Atrial fibrillation with RVR (HCC)    Converted to NSR with lopressor  . Benign prostatic hypertrophy with elevated PSA    Dr Alinda Money, High grade PIN w/o malignancy  . History of colonic polyps 2003, 2007   hyperplastic  . Hx of colonoscopy 2012   neg  . Hyperlipidemia   . Hypertension   . Retinal vascular occlusion    Dr Delman Cheadle    Past Surgical History:  Procedure Laterality Date  . APPENDECTOMY    . COLONOSCOPY W/ POLYPECTOMY      X2 , Dr Deatra Ina  . ENDOVENOUS ABLATION SAPHENOUS VEIN W/ LASER Left 09/29/2017   endovenous laser ablation left greater saphenous vein by Tinnie Gens MD   . HERNIA REPAIR  [phone]    Dr Margot Chimes  . lichenoid keratosis resected  8/14  . LIPOMA EXCISION     X 5  . PROSTATE BIOPSY  09/2010   Dr Alinda Money    Current Outpatient Medications on File Prior to Visit  Medication Sig Dispense Refill  . apixaban (ELIQUIS) 5 MG TABS tablet Take 1 tablet (5 mg total) by mouth 2 (two) times daily. 60 tablet 0  . Cholecalciferol (VITAMIN D3) 2000 units TABS Take by mouth.    . finasteride (PROSCAR) 5 MG tablet Take 5 mg by mouth daily.    . metoprolol tartrate (LOPRESSOR) 25 MG tablet Take 1 tablet (25 mg total) by mouth 2 (two) times daily. 120 tablet 3  . silodosin (RAPAFLO) 8 MG CAPS capsule Take 8 mg by mouth daily with breakfast.     No current facility-administered medications on file  prior to visit.     Physical exam:  Vitals:   12/30/17 1420  BP: 124/76  Pulse: (!) 53  Resp: 16  SpO2: 97%  Weight: 222 lb (100.7 kg)  Height: 6\' 4"  (1.93 m)    Left lower extremity: No open wounds or ulcerations, cluster of varicosities along the medial aspect of the calf running over the pretibial region 4 to 5 mm diameter.  This extends over an area of about 7 cm distally.  Assessment: Status post left greater saphenous ablation with improvement of symptoms.  Persistent varicosities left pretibial region but currently not symptomatic according to the patient.  He is not currently interested in another intervention unless his symptoms become worse.  Plan: The patient will follow-up on an as-needed basis.  Ruta Hinds, MD [facility] Office: [phone] Pager: [phone]   Addendum: Upon further consideration and discussions with his wife the patient has now decided he would like to proceed with stab avulsions of the cluster varicosities on the medial aspect of his calf these are 4 to 5 mm in diameter and extend over an area of about 7 cm.  We will discuss with his insurance company getting approval for this and schedule in the near future.  Ruta Hinds, MD [facility] Office: [phone] Pager: [phone]

## 2017-12-31 ENCOUNTER — Ambulatory Visit: Payer: PPO | Admitting: Vascular Surgery

## 2018-01-05 ENCOUNTER — Telehealth: Payer: Self-pay

## 2018-01-05 NOTE — Telephone Encounter (Signed)
   [facility] Pre-operative Risk Assessment    Request for surgical clearance:  1. What type of surgery is being performed? Stab Phlebectomy > 20 incisions   2. When is this surgery scheduled? 01/27/18    3. What type of clearance is required (medical clearance vs. Pharmacy clearance to hold med vs. Both)? Both  4. Are there any medications that need to be held prior to surgery and how long? Eliquis hold 2 days prior and 2 days after procedure   5. Practice name and name of physician performing surgery? [facility]  6. What is your office phone number [phone]   7.   What is your office fax number [phone]  8.   Anesthesia type (None, local, MAC, general) Not listed   Kathyrn Lass 01/05/2018, 12:07 PM  _________________________________________________________________   (provider comments below)

## 2018-01-11 NOTE — Telephone Encounter (Signed)
Please commend on holding eliquis for stab phlebectomy.

## 2018-01-11 NOTE — Telephone Encounter (Signed)
Patient with diagnosis of Atrial Fibrillation on Eliquis for anticoagulation.    Procedure: Stab Plebectomy Date of procedure: 01/27/18  CHADS2-VASc score of  2 (HTN, AGE)  Scr = 0.76  [facility] office protocol, patient can hold Eliquis for 2 days prior to procedure.    Patient should restart Eliquis on the evening of procedure or day after, at discretion of procedure MD   Benetta Maclaren Rodriguez-Guzman PharmD, BCPS, [place] [address] 01/11/2018 3:22 PM

## 2018-01-12 NOTE — Telephone Encounter (Signed)
   Primary Cardiologist: [name] Klein, MD  Chart reviewed as part of pre-operative protocol coverage. Patient was contacted 01/12/2018 in reference to pre-operative risk assessment for pending surgery as outlined below.  [name] [name] was last seen on 4/11/190 by [name] [name] PAC.  Since that day, [name] [name] has done well. His only complaint is sluggishness secondary to beta blocker. He denies anginal symptoms and can complete more than 4.0 METS.   Regarding holding eliquis, see below from our pharmacist: Patient with diagnosis of Atrial Fibrillation on Eliquis for anticoagulation.    Procedure: Stab Plebectomy Date of procedure: 01/27/18  CHADS2-VASc score of  2 (HTN, AGE) Scr = 0.76  [name] office protocol, patient can hold Eliquis for 2 days prior to procedure.    Patient should restart Eliquis on the evening of procedure or day after, at discretion of procedure MD   Therefore, based on ACC/AHA guidelines, the patient would be at acceptable risk for the planned procedure without further cardiovascular testing.   I will route this recommendation to the requesting party via Epic fax function and remove from pre-op pool.  Please call with questions.  [name], PA 01/12/2018, 2:03 PM

## 2018-01-20 ENCOUNTER — Ambulatory Visit: Payer: PPO | Admitting: Cardiology

## 2018-01-20 ENCOUNTER — Encounter: Payer: Self-pay | Admitting: Cardiology

## 2018-01-20 DIAGNOSIS — I83892 Varicose veins of left lower extremities with other complications: Secondary | ICD-10-CM | POA: Diagnosis not present

## 2018-01-20 DIAGNOSIS — I493 Ventricular premature depolarization: Secondary | ICD-10-CM | POA: Diagnosis not present

## 2018-01-20 DIAGNOSIS — R5383 Other fatigue: Secondary | ICD-10-CM | POA: Insufficient documentation

## 2018-01-20 DIAGNOSIS — Z7901 Long term (current) use of anticoagulants: Secondary | ICD-10-CM | POA: Insufficient documentation

## 2018-01-20 DIAGNOSIS — I480 Paroxysmal atrial fibrillation: Secondary | ICD-10-CM

## 2018-01-20 MED ORDER — METOPROLOL TARTRATE 25 MG PO TABS: 12.5000 mg | ORAL_TABLET | Freq: Two times a day (BID) | ORAL | 3 refills | Status: DC

## 2018-01-20 NOTE — Assessment & Plan Note (Signed)
March 2019- converted spontaneously to NSR

## 2018-01-20 NOTE — Assessment & Plan Note (Signed)
Eliquis- CHADS VASC= 2 (H/O HTN and age)

## 2018-01-20 NOTE — Patient Instructions (Signed)
Medication Instructions:  DECREASE metoprolol tartrate (Lopressor) to 12.5 mg two times daily  Follow-Up: As scheduled 10/17 with Dr. Sallyanne Kuster  Any Other Special Instructions Will Be Listed Below (If Applicable).     If you need a refill on your cardiac medications before your next appointment, please call your pharmacy.

## 2018-01-20 NOTE — Assessment & Plan Note (Signed)
Referred by PCP for abnormal EKG- NSR with bigeminy

## 2018-01-20 NOTE — Assessment & Plan Note (Signed)
H/O saphenous vein RFA- Dr Kellie Simmering

## 2018-01-20 NOTE — Progress Notes (Signed)
Agree MCr 

## 2018-01-20 NOTE — Assessment & Plan Note (Signed)
The pt wonders if this is from [place].

## 2018-01-20 NOTE — Progress Notes (Signed)
01/20/2018 [name] [name]   March 15, 1951  [id number]  Primary Physician Binnie Rail, MD Primary Cardiologist: Dr Sallyanne Kuster  HPI:  Pleasant 67 y/o male seen by Dr Sallyanne Kuster in March 2019 with new onset AF. We had planned for in patient cardioversion but he converted spontaneously before this could be arranged. His echo showed normal LVF, normal LA, mild LVH. He has been maintained on Lopressor 25 mg BID and Eliquis. He has remained in NSR. He brought his tracings from his Apple watch (4). He does complain of generalized fatigue, noticed more when he is at rest than with exertion. No syncope, DOE, or chest pain.    Current Outpatient Medications  Medication Sig Dispense Refill  . apixaban (ELIQUIS) 5 MG TABS tablet Take 1 tablet (5 mg total) by mouth 2 (two) times daily. 60 tablet 0  . finasteride (PROSCAR) 5 MG tablet Take 5 mg by mouth daily.    . Melatonin 10 MG TABS Take 1 tablet by mouth at bedtime.    . metoprolol tartrate (LOPRESSOR) 25 MG tablet Take 0.5 tablets (12.5 mg total) by mouth 2 (two) times daily. 90 tablet 3  . tamsulosin (FLOMAX) 0.4 MG CAPS capsule Take 0.4 mg by mouth at bedtime.  11   No current facility-administered medications for this visit.     No Known Allergies  Past Medical History:  Diagnosis Date  . Atrial fibrillation with RVR (HCC)    Converted to NSR with lopressor  . Benign prostatic hypertrophy with elevated PSA    Dr Alinda Money, High grade PIN w/o malignancy  . History of colonic polyps 2003, 2007   hyperplastic  . Hx of colonoscopy 2012   neg  . Hyperlipidemia   . Hypertension   . Retinal vascular occlusion    Dr Delman Cheadle    Social History   Socioeconomic History  . Marital status: Married    Spouse name: Not on file  . Number of children: Not on file  . Years of education: Not on file  . Highest education level: Not on file  Occupational History  . Not on file  Social Needs  . Financial resource strain: Not on file  . Food  insecurity:    Worry: Not on file    Inability: Not on file  . Transportation needs:    Medical: Not on file    Non-medical: Not on file  Tobacco Use  . Smoking status: Never Smoker  . Smokeless tobacco: Never Used  Substance and Sexual Activity  . Alcohol use: Yes    Alcohol/week: 3.0 standard drinks    Types: 3 Glasses of wine [place] week    Comment:  4 glasses of wine/day in the past, < 1/day now  . Drug use: No  . Sexual activity: Not on file  Lifestyle  . Physical activity:    Days [place] week: Not on file    Minutes [place] session: Not on file  . Stress: Not on file  Relationships  . Social connections:    Talks on phone: Not on file    Gets together: Not on file    Attends religious service: Not on file    Active member of club or organization: Not on file    Attends meetings of clubs or organizations: Not on file    Relationship status: Not on file  . Intimate partner violence:    Fear of current or ex partner: Not on file    Emotionally abused: Not on file  Physically abused: Not on file    Forced sexual activity: Not on file  Other Topics Concern  . Not on file  Social History Narrative  . Not on file     Family History  Problem Relation Age of Onset  . Alzheimer's disease Mother   . Heart attack Father 28  . Hypertension Brother   . Hypertension Maternal Grandmother   . Diabetes Neg Hx   . Stroke Neg Hx      Review of Systems: General: negative for chills, fever, night sweats or weight changes.  Cardiovascular: negative for chest pain, dyspnea on exertion, edema, orthopnea, palpitations, paroxysmal nocturnal dyspnea or shortness of breath Dermatological: negative for rash Respiratory: negative for cough or wheezing Urologic: negative for hematuria Abdominal: negative for nausea, vomiting, diarrhea, bright red blood Aniello rectum, melena, or hematemesis Neurologic: negative for visual changes, syncope, or dizziness All other systems reviewed and are  otherwise negative except as noted above.    Blood pressure 134/87, pulse 61, height 6\' 4"  (1.93 m), weight 225 lb 12.8 oz (102.4 kg).  General appearance: alert, cooperative and no distress Neck: no JVD Lungs: clear to auscultation bilaterally Heart: regular rate and rhythm Extremities: varicose veins noted and no edema Skin: warm and dry Neurologic: Grossly normal  EKG NSR, HR 61  ASSESSMENT AND PLAN:   PVC's (premature ventricular contractions) Referred by PCP for abnormal EKG- NSR with bigeminy   PAF (paroxysmal atrial fibrillation) [facility]) March 2019- converted spontaneously to NSR  Varicose veins of left lower extremity with complications H/O saphenous vein RFA- Dr Kellie Simmering  Chronic anticoagulation Eliquis- CHADS VASC= 2 (H/O HTN and age)  Fatigue The pt wonders if this is from Lopressor.   PLAN   I suggested Mr Sachse decrease his Lopressor to 12.5 mg BID and see if that makes a difference. He has a f/u with Dr Sallyanne Kuster in October and will keep that.   Kerin Ransom PA-C 01/20/2018 3:47 PM

## 2018-01-25 NOTE — Addendum Note (Signed)
Addended by: Vennie Homans on: 01/25/2018 12:06 PM   Modules accepted: Orders

## 2018-01-27 ENCOUNTER — Ambulatory Visit: Payer: PPO | Admitting: Vascular Surgery

## 2018-01-27 ENCOUNTER — Encounter: Payer: Self-pay | Admitting: Vascular Surgery

## 2018-01-27 ENCOUNTER — Other Ambulatory Visit: Payer: Self-pay

## 2018-01-27 VITALS — BP 126/73 | HR 60 | Temp 98.3°F | Resp 16 | Ht 76.0 in | Wt 225.8 lb

## 2018-01-27 DIAGNOSIS — I83812 Varicose veins of left lower extremities with pain: Secondary | ICD-10-CM

## 2018-01-27 HISTORY — PX: OTHER SURGICAL HISTORY: SHX169

## 2018-01-27 NOTE — Progress Notes (Signed)
    Stab Phlebectomy Procedure  [name] [name] DOB:1950-10-13  01/27/2018  Consent signed: Yes  Surgeon:C.E. Regena Delucchi, MD  Procedure: stab phlebectomy: left leg  BP 126/73 (BP Location: Left Arm, Patient Position: Sitting, Cuff Size: Normal)   Pulse 60   Temp 98.3 F (36.8 C) (Oral)   Resp 16   Ht 6\' 4"  (1.93 m)   Wt 225 lb 12.8 oz (102.4 kg)   SpO2 97%   BMI 27.49 kg/m   Start time: 2:00PM   End time: 2:45PM   Tumescent Anesthesia: 225 cc 0.9% NaCl with 50 cc Lidocaine HCL 1% and 15 cc 8.4% NaHCO3  Local Anesthesia: 6 cc Lidocaine HCL and NaHCO3 (ratio 2:1)    Stab Phlebectomy: 10-20  Sites: Calf  Patient tolerated procedure well: Yes  Notes: Last dose of Eliquis taken 01-24-2018  Description of Procedure:  After marking the course of the secondary varicosities, the patient was placed on the operating table in the supine position, and the left leg was prepped and draped in sterile fashion.    The patient was then put into Trendelenburg position.  Local anesthetic was administered at the previously marked varicosities, and tumescent anesthesia was administered around the vessels.  Ten to 20 stab wounds were made using the tip of an 11 blade. And using the vein hook, the phlebectomies were performed using a hemostat to avulse the varicosities.  Adequate hemostasis was achieved, and steri strips were applied to the stab wound.      ABD pads and thigh high compression stockings were applied as well ace wraps where needed. Blood loss was less than 15 cc.  The patient ambulated out of the operating room having tolerated the procedure well.   Ruta Hinds, MD [facility] Office: [phone] Pager: [phone]

## 2018-03-03 DIAGNOSIS — D485 Neoplasm of uncertain behavior of skin: Secondary | ICD-10-CM | POA: Diagnosis not present

## 2018-03-03 DIAGNOSIS — D225 Melanocytic nevi of trunk: Secondary | ICD-10-CM | POA: Diagnosis not present

## 2018-03-03 DIAGNOSIS — D1721 Benign lipomatous neoplasm of skin and subcutaneous tissue of right arm: Secondary | ICD-10-CM | POA: Diagnosis not present

## 2018-03-03 DIAGNOSIS — L570 Actinic keratosis: Secondary | ICD-10-CM | POA: Diagnosis not present

## 2018-03-03 DIAGNOSIS — D235 Other benign neoplasm of skin of trunk: Secondary | ICD-10-CM | POA: Diagnosis not present

## 2018-03-03 DIAGNOSIS — D2262 Melanocytic nevi of left upper limb, including shoulder: Secondary | ICD-10-CM | POA: Diagnosis not present

## 2018-03-03 DIAGNOSIS — D2239 Melanocytic nevi of other parts of face: Secondary | ICD-10-CM | POA: Diagnosis not present

## 2018-03-03 DIAGNOSIS — D2272 Melanocytic nevi of left lower limb, including hip: Secondary | ICD-10-CM | POA: Diagnosis not present

## 2018-03-03 DIAGNOSIS — L821 Other seborrheic keratosis: Secondary | ICD-10-CM | POA: Diagnosis not present

## 2018-03-03 DIAGNOSIS — C44612 Basal cell carcinoma of skin of right upper limb, including shoulder: Secondary | ICD-10-CM | POA: Diagnosis not present

## 2018-03-03 DIAGNOSIS — L814 Other melanin hyperpigmentation: Secondary | ICD-10-CM | POA: Diagnosis not present

## 2018-03-04 ENCOUNTER — Ambulatory Visit: Payer: PPO | Admitting: Cardiovascular Disease

## 2018-03-04 ENCOUNTER — Encounter: Payer: Self-pay | Admitting: Cardiovascular Disease

## 2018-03-04 VITALS — BP 128/80 | HR 62 | Ht 76.0 in | Wt 228.0 lb

## 2018-03-04 DIAGNOSIS — I10 Essential (primary) hypertension: Secondary | ICD-10-CM | POA: Diagnosis not present

## 2018-03-04 DIAGNOSIS — Z7901 Long term (current) use of anticoagulants: Secondary | ICD-10-CM

## 2018-03-04 DIAGNOSIS — I480 Paroxysmal atrial fibrillation: Secondary | ICD-10-CM | POA: Diagnosis not present

## 2018-03-04 MED ORDER — METOPROLOL SUCCINATE ER 25 MG PO TB24: 25.0000 mg | ORAL_TABLET | Freq: Every day | ORAL | 3 refills | Status: DC

## 2018-03-04 NOTE — Patient Instructions (Signed)
Medication Instructions:  Dr Sallyanne Kuster has recommended making the following medication changes: 1. STOP Metoprolol TARTRATE 2. START Metoprolol SUCCINATE 25 mg - take 1 tablet daily  If you need a refill on your cardiac medications before your next appointment, please call your pharmacy.   Follow-Up: At [facility], you and your health needs are our priority.  As part of our continuing mission to provide you with exceptional heart care, we have created designated Provider Care Teams.  These Care Teams include your primary Cardiologist (physician) and Advanced Practice Providers (APPs -  Physician Assistants and Nurse Practitioners) who all work together to provide you with the care you need, when you need it. You will need a follow up appointment in 12 months.  Please call our office 2 months in advance to schedule this appointment.  You may see Sanda Klein, MD or one of the following Advanced Practice Providers on your designated Care Team: [place], [place] . Fabian Sharp, PA-C

## 2018-03-05 ENCOUNTER — Encounter: Payer: Self-pay | Admitting: Cardiovascular Disease

## 2018-03-05 NOTE — Progress Notes (Signed)
Cardiology Office Note:    Date:  03/05/2018   ID:  [name] [name], DOB [birth year], MRN [id number]  PCP:  Binnie Rail, MD  Cardiologist:  Sanda Klein, MD  Electrophysiologist:  None   Referring MD: Binnie Rail, MD   Chief Complaint  Patient presents with  . Atrial Fibrillation    History of Present Illness:    [name] [name] is a 67 y.o. male with a hx of paroxysmal atrial fibrillation led to a brief hospitalization in March 2019, without clinical recurrence.  He has systemic hypertension but no structural heart disease other than mild left ventricular hypertrophy.  His left atrium was normal in size and left ventricular systolic function is normal.  He does not have significant valvular abnormalities.  Subsequent evaluation has shown sinus rhythm with ventricular bigeminy which is asymptomatic.  He has had ablation of the right greater saphenous vein, but does not have a history of DVT.  He described fatigue with the beta-blockers and the dose of Lopressor was reduced to 12.5 mg twice daily which led to improvement in his symptoms.  Not had any falls, injuries or serious bleeding problems with apixaban, but does have easy bruising of the forearms.  Past Medical History:  Diagnosis Date  . Atrial fibrillation with RVR (HCC)    Converted to NSR with lopressor  . Benign prostatic hypertrophy with elevated PSA    Dr Alinda Money, High grade PIN w/o malignancy  . History of colonic polyps 2003, 2007   hyperplastic  . Hx of colonoscopy 2012   neg  . Hyperlipidemia   . Hypertension   . Retinal vascular occlusion    Dr Delman Cheadle    Past Surgical History:  Procedure Laterality Date  . APPENDECTOMY    . COLONOSCOPY W/ POLYPECTOMY      X2 , Dr Deatra Ina  . ENDOVENOUS ABLATION SAPHENOUS VEIN W/ LASER Left 09/29/2017   endovenous laser ablation left greater saphenous vein by Tinnie Gens MD   . HERNIA REPAIR  [phone]    Dr Margot Chimes  . lichenoid keratosis resected  8/14  . LIPOMA  EXCISION     X 5  . PROSTATE BIOPSY  09/2010   Dr Alinda Money  . stab phlebectomy  Left 01/27/2018   stab phlebectomy 10-20 incisions left leg by Ruta Hinds MD    Current Medications: Current Meds  Medication Sig  . apixaban (ELIQUIS) 5 MG TABS tablet Take 1 tablet (5 mg total) by mouth 2 (two) times daily.  . finasteride (PROSCAR) 5 MG tablet Take 5 mg by mouth daily.  . Melatonin 10 MG TABS Take 1 tablet by mouth at bedtime.  . tamsulosin (FLOMAX) 0.4 MG CAPS capsule Take 0.4 mg by mouth at bedtime.  . [DISCONTINUED] metoprolol tartrate (LOPRESSOR) 25 MG tablet Take 0.5 tablets (12.5 mg total) by mouth 2 (two) times daily.     Allergies:   Patient has no known allergies.   Social History   Socioeconomic History  . Marital status: Married    Spouse name: Not on file  . Number of children: Not on file  . Years of education: Not on file  . Highest education level: Not on file  Occupational History  . Not on file  Social Needs  . Financial resource strain: Not on file  . Food insecurity:    Worry: Not on file    Inability: Not on file  . Transportation needs:    Medical: Not on file    Non-medical:  Not on file  Tobacco Use  . Smoking status: Never Smoker  . Smokeless tobacco: Never Used  Substance and Sexual Activity  . Alcohol use: Yes    Alcohol/week: 3.0 standard drinks    Types: 3 Glasses of wine [place] week    Comment:  4 glasses of wine/day in the past, < 1/day now  . Drug use: No  . Sexual activity: Not on file  Lifestyle  . Physical activity:    Days Jep week: Not on file    Minutes [place] session: Not on file  . Stress: Not on file  Relationships  . Social connections:    Talks on phone: Not on file    Gets together: Not on file    Attends religious service: Not on file    Active member of club or organization: Not on file    Attends meetings of clubs or organizations: Not on file    Relationship status: Not on file  Other Topics Concern  . Not on file    Social History Narrative  . Not on file     Family History: The patient's family history includes Alzheimer's disease in his mother; Heart attack (age of onset: 32) in his father; Hypertension in his brother and maternal grandmother. There is no history of Diabetes or Stroke.  ROS:   Please see the history of present illness.     All other systems reviewed and are negative.  EKGs/Labs/Other Studies Reviewed:    EKG:  EKG is not ordered today.  The ekg ordered 01/20/2018 demonstrates an [place] rhythm, normal tracing  Recent Labs: 07/29/2017: Magnesium 2.0; TSH 1.688 07/30/2017: ALT 24; B Natriuretic Peptide 180.5; BUN 16; Creatinine, Ser 0.76; Hemoglobin 14.7; Platelets 191; Potassium 4.0; Sodium 142  Recent Lipid Panel    Component Value Date/Time   CHOL 159 07/30/2017 1244   TRIG 43 07/30/2017 1244   HDL 77 07/30/2017 1244   CHOLHDL 2.1 07/30/2017 1244   VLDL 9 07/30/2017 1244   LDLCALC 73 07/30/2017 1244   LDLDIRECT 108.9 05/17/2009 0000    Physical Exam:    VS:  BP 128/80   Pulse 62   Ht 6\' 4"  (1.93 m)   Wt 228 lb (103.4 kg)   SpO2 97%   BMI 27.75 kg/m     Wt Readings from Last 3 Encounters:  03/04/18 228 lb (103.4 kg)  01/27/18 225 lb 12.8 oz (102.4 kg)  01/20/18 225 lb 12.8 oz (102.4 kg)     GEN:  Well nourished, well developed in no acute distress HEENT: Normal NECK: No JVD; No carotid bruits LYMPHATICS: No lymphadenopathy CARDIAC: RRR, no murmurs, rubs, gallops RESPIRATORY:  Clear to auscultation without rales, wheezing or rhonchi  ABDOMEN: Soft, non-tender, non-distended MUSCULOSKELETAL:  No edema; No deformity  SKIN: Warm and dry NEUROLOGIC:  Alert and oriented x 3 PSYCHIATRIC:  Normal affect   ASSESSMENT:    1. Paroxysmal atrial fibrillation (HCC)   2. Essential hypertension   3. Current use of long term anticoagulation    PLAN:    In order of problems listed above:  1. AFib: No obvious recurrence since his hospitalization.  Doing better on  the lower dose of beta-blockers.  We will switch him to metoprolol succinate to be taken in the evening to see if this leads to additional improvement in his stamina, but the beta-blocker is also useful for suppression of his PVCs. 2. HTN: Well-controlled. 3. Eliquis: Well-tolerated without bleeding problems, discussed how to handle the need  for interruption around the time of surgery or bleeding complications.   Medication Adjustments/Labs and Tests Ordered: Current medicines are reviewed at length with the patient today.  Concerns regarding medicines are outlined above.  No orders of the defined types were placed in this encounter.  Meds ordered this encounter  Medications  . metoprolol succinate (TOPROL-XL) 25 MG 24 hr tablet    Sig: Take 1 tablet (25 mg total) by mouth daily.    Dispense:  90 tablet    Refill:  3    Patient Instructions  Medication Instructions:  Dr Sallyanne Kuster has recommended making the following medication changes: 1. STOP Metoprolol TARTRATE 2. START Metoprolol SUCCINATE 25 mg - take 1 tablet daily  If you need a refill on your cardiac medications before your next appointment, please call your pharmacy.   Follow-Up: At [facility], you and your health needs are our priority.  As part of our continuing mission to provide you with exceptional heart care, we have created designated Provider Care Teams.  These Care Teams include your primary Cardiologist (physician) and Advanced Practice Providers (APPs -  Physician Assistants and Nurse Practitioners) who all work together to provide you with the care you need, when you need it. You will need a follow up appointment in 12 months.  Please call our office 2 months in advance to schedule this appointment.  You may see Sanda Klein, MD or one of the following Advanced Practice Providers on your designated Care Team: [place], [place] . Fabian Sharp, PA-C    Signed, Sanda Klein, MD  03/05/2018 4:39 PM    [place]

## 2018-03-18 ENCOUNTER — Other Ambulatory Visit: Payer: Self-pay

## 2018-03-19 ENCOUNTER — Other Ambulatory Visit: Payer: Self-pay | Admitting: Pharmacist Clinician (PhC)/ Clinical Pharmacy Specialist

## 2018-03-19 MED ORDER — APIXABAN 5 MG PO TABS: 5.0000 mg | ORAL_TABLET | Freq: Two times a day (BID) | ORAL | 1 refills | Status: DC

## 2018-04-07 DIAGNOSIS — R972 Elevated prostate specific antigen [PSA]: Secondary | ICD-10-CM | POA: Diagnosis not present

## 2018-04-21 DIAGNOSIS — R3912 Poor urinary stream: Secondary | ICD-10-CM | POA: Diagnosis not present

## 2018-04-21 DIAGNOSIS — N5201 Erectile dysfunction due to arterial insufficiency: Secondary | ICD-10-CM | POA: Diagnosis not present

## 2018-04-21 DIAGNOSIS — R972 Elevated prostate specific antigen [PSA]: Secondary | ICD-10-CM | POA: Diagnosis not present

## 2018-04-21 DIAGNOSIS — N401 Enlarged prostate with lower urinary tract symptoms: Secondary | ICD-10-CM | POA: Diagnosis not present

## 2018-06-10 ENCOUNTER — Ambulatory Visit: Payer: PPO | Admitting: Family Medicine

## 2018-07-14 DIAGNOSIS — Z85828 Personal history of other malignant neoplasm of skin: Secondary | ICD-10-CM | POA: Diagnosis not present

## 2018-07-14 DIAGNOSIS — L570 Actinic keratosis: Secondary | ICD-10-CM | POA: Diagnosis not present

## 2018-07-14 DIAGNOSIS — D1801 Hemangioma of skin and subcutaneous tissue: Secondary | ICD-10-CM | POA: Diagnosis not present

## 2018-08-18 ENCOUNTER — Other Ambulatory Visit: Payer: Self-pay | Admitting: Pharmacist Clinician (PhC)/ Clinical Pharmacy Specialist

## 2018-08-18 MED ORDER — APIXABAN 5 MG PO TABS: 5.0000 mg | ORAL_TABLET | Freq: Two times a day (BID) | ORAL | 1 refills | Status: DC

## 2018-11-16 ENCOUNTER — Telehealth: Payer: Self-pay | Admitting: Internal Medicine

## 2018-11-16 DIAGNOSIS — R7303 Prediabetes: Secondary | ICD-10-CM

## 2018-11-16 DIAGNOSIS — E782 Mixed hyperlipidemia: Secondary | ICD-10-CM

## 2018-11-16 DIAGNOSIS — I10 Essential (primary) hypertension: Secondary | ICD-10-CM

## 2018-11-16 DIAGNOSIS — I480 Paroxysmal atrial fibrillation: Secondary | ICD-10-CM

## 2018-11-16 NOTE — Telephone Encounter (Signed)
Patient is scheduled for his physical on Friday, July 24th. He would like to have his labs done prior to the appointment. Can orders be put in for him please?

## 2018-11-16 NOTE — Telephone Encounter (Signed)
ordered

## 2018-11-17 NOTE — Telephone Encounter (Signed)
Pt aware.

## 2018-11-22 ENCOUNTER — Other Ambulatory Visit (INDEPENDENT_AMBULATORY_CARE_PROVIDER_SITE_OTHER): Payer: PPO

## 2018-11-22 DIAGNOSIS — I10 Essential (primary) hypertension: Secondary | ICD-10-CM | POA: Diagnosis not present

## 2018-11-22 DIAGNOSIS — R7303 Prediabetes: Secondary | ICD-10-CM

## 2018-11-22 DIAGNOSIS — I480 Paroxysmal atrial fibrillation: Secondary | ICD-10-CM

## 2018-11-22 DIAGNOSIS — E782 Mixed hyperlipidemia: Secondary | ICD-10-CM

## 2018-11-22 LAB — LIPID PANEL
Cholesterol: 161 mg/dL (ref 0–200)
HDL: 73.9 mg/dL (ref >39.00)
LDL Cholesterol: 78 mg/dL (ref 0–99)
NonHDL: 86.72
Total CHOL/HDL Ratio: 2
Triglycerides: 45 mg/dL (ref 0.0–149.0)
VLDL: 9 mg/dL (ref 0.0–40.0)

## 2018-11-22 LAB — CBC WITH DIFFERENTIAL/PLATELET
Basophils Absolute: 0 10*3/uL (ref 0.0–0.1)
Basophils Relative: 1 % (ref 0.0–3.0)
Eosinophils Absolute: 0 10*3/uL (ref 0.0–0.7)
Eosinophils Relative: 1 % (ref 0.0–5.0)
HCT: 43.5 % (ref 39.0–52.0)
Hemoglobin: 14.6 g/dL (ref 13.0–17.0)
Lymphocytes Relative: 41.5 % (ref 12.0–46.0)
Lymphs Abs: 2.1 10*3/uL (ref 0.7–4.0)
MCHC: 33.5 g/dL (ref 30.0–36.0)
MCV: 91.8 fl (ref 78.0–100.0)
Monocytes Absolute: 0.4 10*3/uL (ref 0.1–1.0)
Monocytes Relative: 8 % (ref 3.0–12.0)
Neutro Abs: 2.4 10*3/uL (ref 1.4–7.7)
Neutrophils Relative %: 48.5 % (ref 43.0–77.0)
Platelets: 203 10*3/uL (ref 150.0–400.0)
RBC: 4.74 Mil/uL (ref 4.22–5.81)
RDW: 14.2 % (ref 11.5–15.5)
WBC: 5 10*3/uL (ref 4.0–10.5)

## 2018-11-22 LAB — COMPREHENSIVE METABOLIC PANEL
ALT: 37 U/L (ref 0–53)
AST: 32 U/L (ref 0–37)
Albumin: 4.2 g/dL (ref 3.5–5.2)
Alkaline Phosphatase: 58 U/L (ref 39–117)
BUN: 17 mg/dL (ref 6–23)
CO2: 28 mEq/L (ref 19–32)
Calcium: 8.7 mg/dL (ref 8.4–10.5)
Chloride: 106 mEq/L (ref 96–112)
Creatinine, Ser: 0.9 mg/dL (ref 0.40–1.50)
GFR: 83.37 mL/min (ref >60.00)
Glucose, Bld: 101 mg/dL — ABNORMAL HIGH (ref 70–99)
Potassium: 4.5 mEq/L (ref 3.5–5.1)
Sodium: 141 mEq/L (ref 135–145)
Total Bilirubin: 0.6 mg/dL (ref 0.2–1.2)
Total Protein: 6.7 g/dL (ref 6.0–8.3)

## 2018-11-22 LAB — TSH: TSH: 1.9 u[IU]/mL (ref 0.35–4.50)

## 2018-11-22 LAB — HEMOGLOBIN A1C: Hgb A1c MFr Bld: 5.6 % (ref 4.6–6.5)

## 2018-11-24 ENCOUNTER — Other Ambulatory Visit: Payer: Self-pay

## 2018-11-24 ENCOUNTER — Encounter: Payer: Self-pay | Admitting: Family Medicine

## 2018-11-24 ENCOUNTER — Ambulatory Visit: Payer: PPO | Admitting: Family Medicine

## 2018-11-24 ENCOUNTER — Ambulatory Visit: Payer: Self-pay

## 2018-11-24 VITALS — BP 122/72 | Ht 76.0 in | Wt 225.0 lb

## 2018-11-24 DIAGNOSIS — M7061 Trochanteric bursitis, right hip: Secondary | ICD-10-CM | POA: Diagnosis not present

## 2018-11-24 DIAGNOSIS — M25551 Pain in right hip: Secondary | ICD-10-CM

## 2018-11-24 NOTE — Assessment & Plan Note (Signed)
Patient had injection today.  Has responded very well over time.  Hoping that this will be beneficial.  We have discussed the possibility that this could get worseRadicular symptoms.  Discussed icing regimen, home exercise, hip abductor strengthening.  Follow-up again in 4 to 8 weeks

## 2018-11-24 NOTE — Patient Instructions (Signed)
Good to see you for your annual injection  I am hopeful it will work again  [employer] the exercises on Monday  Ice 20 minutes 2 times daily. Usually after activity and before bed. pennsaid pinkie amount topically 2 times daily as needed.  See me agai nin 6 weeks if not perfect otherwise see me when you need me

## 2018-11-24 NOTE — Progress Notes (Signed)
[name] [place],  [id number] Phone: [phone] Subjective:   [name], am serving as a scribe for Dr. Hulan Saas.   CC: Hip pain follow-up  [id number]   [name] [name] is a 68 y.o. male coming in with complaint of hip pain. Patient last seen for right shoulder pain in 2019. Right hip pain in the morning. Pain moves throughout the day. Denies any radiating symptoms. Has had injection for the greater trochanter which helped.      Past Medical History:  Diagnosis Date  . Atrial fibrillation with RVR (HCC)    Converted to NSR with lopressor  . Benign prostatic hypertrophy with elevated PSA    Dr Alinda Money, High grade PIN w/o malignancy  . History of colonic polyps 2003, 2007   hyperplastic  . Hx of colonoscopy 2012   neg  . Hyperlipidemia   . Hypertension   . Retinal vascular occlusion    Dr Delman Cheadle   Past Surgical History:  Procedure Laterality Date  . APPENDECTOMY    . COLONOSCOPY W/ POLYPECTOMY      X2 , Dr Deatra Ina  . ENDOVENOUS ABLATION SAPHENOUS VEIN W/ LASER Left 09/29/2017   endovenous laser ablation left greater saphenous vein by Tinnie Gens MD   . HERNIA REPAIR  [phone]    Dr Margot Chimes  . lichenoid keratosis resected  8/14  . LIPOMA EXCISION     X 5  . PROSTATE BIOPSY  09/2010   Dr Alinda Money  . stab phlebectomy  Left 01/27/2018   stab phlebectomy 10-20 incisions left leg by Ruta Hinds MD   Social History   Socioeconomic History  . Marital status: Married    Spouse name: Not on file  . Number of children: Not on file  . Years of education: Not on file  . Highest education level: Not on file  Occupational History  . Not on file  Social Needs  . Financial resource strain: Not on file  . Food insecurity    Worry: Not on file    Inability: Not on file  . Transportation needs    Medical: Not on file    Non-medical: Not on file  Tobacco Use  . Smoking status: Never Smoker  . Smokeless  tobacco: Never Used  Substance and Sexual Activity  . Alcohol use: Yes    Alcohol/week: 3.0 standard drinks    Types: 3 Glasses of wine [place] week    Comment:  4 glasses of wine/day in the past, < 1/day now  . Drug use: No  . Sexual activity: Not on file  Lifestyle  . Physical activity    Days [place] week: Not on file    Minutes Jr session: Not on file  . Stress: Not on file  Relationships  . Social Herbalist on phone: Not on file    Gets together: Not on file    Attends religious service: Not on file    Active member of club or organization: Not on file    Attends meetings of clubs or organizations: Not on file    Relationship status: Not on file  Other Topics Concern  . Not on file  Social History Narrative  . Not on file   No Known Allergies Family History  Problem Relation Age of Onset  . Alzheimer's disease Mother   . Heart attack Father 46  . Hypertension Brother   . Hypertension Maternal Grandmother   .  Diabetes Neg Hx   . Stroke Neg Hx      Current Outpatient Medications (Cardiovascular):  .  metoprolol succinate (TOPROL-XL) 25 MG 24 hr tablet, Take 1 tablet (25 mg total) by mouth daily.    Current Outpatient Medications (Hematological):  .  apixaban (ELIQUIS) 5 MG TABS tablet, Take 1 tablet (5 mg total) by mouth 2 (two) times daily.  Current Outpatient Medications (Other):  Marland Kitchen  Melatonin 10 MG TABS, Take 1 tablet by mouth at bedtime. .  finasteride (PROSCAR) 5 MG tablet, Take 5 mg by mouth daily. .  tamsulosin (FLOMAX) 0.4 MG CAPS capsule, Take 0.4 mg by mouth at bedtime.    Past medical history, social, surgical and family history all reviewed in electronic medical record.  No pertanent information unless stated regarding to the chief complaint.   Review of Systems:  No headache, visual changes, nausea, vomiting, diarrhea, constipation, dizziness, abdominal pain, skin rash, fevers, chills, night sweats, weight loss, swollen lymph nodes, body  aches, joint swelling,  chest pain, shortness of breath, mood changes.  Positive muscle aches  Objective  Blood pressure 122/72, height 6\' 4"  (1.93 m), weight 225 lb (102.1 kg).    General: No apparent distress alert and oriented x3 mood and affect normal, dressed appropriately.  HEENT: Pupils equal, extraocular movements intact  Respiratory: Patient's speak in full sentences and does not appear short of breath  Cardiovascular: No lower extremity edema, non tender, no erythema  Skin: Warm dry intact with no signs of infection or rash on extremities or on axial skeleton.  Abdomen: Soft nontender  Neuro: Cranial nerves II through XII are intact, neurovascularly intact in all extremities with 2+ DTRs and 2+ pulses.  Lymph: No lymphadenopathy of posterior or anterior cervical chain or axillae bilaterally.  Gait mild antalgic MSK:  Non tender with full range of motion and good stability and symmetric strength and tone of shoulders, elbows, wrist,  knee and ankles bilaterally.   Right hip exam shows the patient does have some mild limited range of motion especially with Corky Sox test.  Patient does have a negative straight leg test.  Patient does have tenderness to palpation with moderate to severe over the right greater trochanteric area compared to the contralateral side.  Neck exam does show some mild tenderness of the right sacroiliac joint.   Procedure: Real-time Ultrasound Guided Injection of right greater trochanteric bursitis secondary to patient's body habitus Device: GE Logiq Q7 Ultrasound guided injection is preferred based studies that show increased duration, increased effect, greater accuracy, decreased procedural pain, increased response rate, and decreased cost with ultrasound guided versus blind injection.  Verbal informed consent obtained.  Time-out conducted.  Noted no overlying erythema, induration, or other signs of local infection.  Skin prepped in a sterile fashion.  Local  anesthesia: Topical Ethyl chloride.  With sterile technique and under real time ultrasound guidance:  Greater trochanteric area was visualized and patient's bursa was noted. A 22-gauge 3 inch needle was inserted and 4 cc of 0.5% Marcaine and 1 cc of Kenalog 40 mg/dL was injected. Pictures taken Completed without difficulty  Pain immediately resolved suggesting accurate placement of the medication.  Advised to call if fevers/chills, erythema, induration, drainage, or persistent bleeding.  Images permanently stored and available for review in the ultrasound unit.  Impression: Technically successful ultrasound guided injection.   Impression and Recommendations:     This case required medical decision making of moderate complexity. The above documentation has been reviewed and is accurate  and complete Lyndal Pulley, DO       Note: This dictation was prepared with Dragon dictation along with smaller phrase technology. Any transcriptional errors that result from this process are unintentional.

## 2018-12-09 NOTE — Progress Notes (Signed)
Subjective:    Patient ID: [name] [name], male    DOB: [birth year], 68 y.o.   MRN: [id number]  HPI He is here for a physical exam.   He has no concerns.  He denies any changes in his history since he was here last.  He is doing well with his cardiac medications and denies any palpitations or recurrences of A. fib.  Medications and allergies reviewed with patient and updated if appropriate.  Patient Active Problem List   Diagnosis Date Noted  . Current use of long term anticoagulation 01/20/2018  . Fatigue 01/20/2018  . A-fib ([place]) 07/30/2017  . EtOH dependence ([place]) 07/30/2017  . Paroxysmal atrial fibrillation ([place]) 07/29/2017  . Varicose veins of left lower extremity with complications 76/81/1572  . Greater trochanteric bursitis of right hip 01/02/2017  . Prediabetes 09/20/2016  . Rotator cuff syndrome of right shoulder 09/27/2015  . Vitamin D deficiency 09/07/2014  . Lichenoid keratosis 62/07/5595  . Myalgia and myositis 09/01/2013  . Superficial thrombosis of leg 03/28/2013  . 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 Inguinal hernia recurrent unilateral 08/10/2009  . History of colonic polyps 05/15/2008  . Essential hypertension 05/15/2008  . HYPERLIPIDEMIA 05/12/2007  . Elevated prostate specific antigen (PSA) 05/12/2007    Current Outpatient Medications on File Prior to Visit  Medication Sig Dispense Refill  . apixaban (ELIQUIS) 5 MG TABS tablet Take 1 tablet (5 mg total) by mouth 2 (two) times daily. 180 tablet 1  . Melatonin 10 MG TABS Take 1 tablet by mouth at bedtime.    . metoprolol succinate (TOPROL-XL) 25 MG 24 hr tablet Take 1 tablet (25 mg total) by mouth daily. 90 tablet 3   No current facility-administered medications on file prior to visit.     Past Medical History:  Diagnosis Date  . Atrial fibrillation with RVR (HCC)    Converted to NSR with lopressor  . Benign prostatic hypertrophy with elevated PSA    Dr Alinda Money, High grade PIN w/o malignancy  .  History of colonic polyps 2003, 2007   hyperplastic  . Hx of colonoscopy 2012   neg  . Hyperlipidemia   . Hypertension   . Retinal vascular occlusion    Dr Delman Cheadle    Past Surgical History:  Procedure Laterality Date  . APPENDECTOMY    . COLONOSCOPY W/ POLYPECTOMY      X2 , Dr Deatra Ina  . ENDOVENOUS ABLATION SAPHENOUS VEIN W/ LASER Left 09/29/2017   endovenous laser ablation left greater saphenous vein by Tinnie Gens MD   . HERNIA REPAIR  [phone]    Dr Margot Chimes  . lichenoid keratosis resected  8/14  . LIPOMA EXCISION     X 5  . PROSTATE BIOPSY  09/2010   Dr Alinda Money  . stab phlebectomy  Left 01/27/2018   stab phlebectomy 10-20 incisions left leg by Ruta Hinds MD    Social History   Socioeconomic History  . Marital status: Married    Spouse name: Not on file  . Number of children: Not on file  . Years of education: Not on file  . Highest education level: Not on file  Occupational History  . Not on file  Social Needs  . Financial resource strain: Not on file  . Food insecurity    Worry: Not on file    Inability: Not on file  . Transportation needs    Medical: Not on file    Non-medical: Not on file  Tobacco Use  . Smoking  status: Never Smoker  . Smokeless tobacco: Never Used  Substance and Sexual Activity  . Alcohol use: Yes    Alcohol/week: 3.0 standard drinks    Types: 3 Glasses of wine [place] week    Comment:  4 glasses of wine/day in the past, < 1/day now  . Drug use: No  . Sexual activity: Not on file  Lifestyle  . Physical activity    Days [place] week: Not on file    Minutes [place] session: Not on file  . Stress: Not on file  Relationships  . Social Herbalist on phone: Not on file    Gets together: Not on file    Attends religious service: Not on file    Active member of club or organization: Not on file    Attends meetings of clubs or organizations: Not on file    Relationship status: Not on file  Other Topics Concern  . Not on file  Social  History Narrative  . Not on file    Family History  Problem Relation Age of Onset  . Alzheimer's disease Mother   . Heart attack Father 58  . Hypertension Brother   . Hypertension Maternal Grandmother   . Diabetes Neg Hx   . Stroke Neg Hx     Review of Systems  Constitutional: Negative for chills, fatigue and fever.  HENT: Positive for tinnitus.   Eyes: Negative for visual disturbance.  Respiratory: Negative for cough, shortness of breath and wheezing.   Cardiovascular: Negative for chest pain, palpitations and leg swelling.  Gastrointestinal: Negative for abdominal pain, blood in stool, constipation, diarrhea and nausea.       No gerd  Genitourinary: Negative for dysuria and hematuria.  Musculoskeletal: Negative for arthralgias and back pain.  Skin: Negative for color change and rash.  Neurological: Negative for dizziness and headaches.  Psychiatric/Behavioral: Negative for dysphoric mood. The patient is not nervous/anxious.        Objective:   Vitals:   12/10/18 1047  BP: 136/84  Pulse: (!) 55  Resp: 16  Temp: 98.2 F (36.8 C)  SpO2: 98%   Filed Weights   12/10/18 1047  Weight: 222 lb 12.8 oz (101.1 kg)   Body mass index is 27.12 kg/m.  Wt Readings from Last 3 Encounters:  12/10/18 222 lb 12.8 oz (101.1 kg)  11/24/18 225 lb (102.1 kg)  03/04/18 228 lb (103.4 kg)     Physical Exam Constitutional: He appears well-developed and well-nourished. No distress.  HENT:  Head: Normocephalic and atraumatic.  Right Ear: External ear normal.  Left Ear: External ear normal.  Mouth/Throat: Oropharynx is clear and moist.  Normal ear canals and TM b/l  Eyes: Conjunctivae and EOM are normal.  Neck: Neck supple. No tracheal deviation present. No thyromegaly present.  No carotid bruit  Cardiovascular: Normal rate, regular rhythm, normal heart sounds and intact distal pulses.   No murmur heard. Pulmonary/Chest: Effort normal and breath sounds normal. No respiratory  distress. He has no wheezes. He has no rales.  Abdominal: Soft. He exhibits no distension. There is no tenderness.  Genitourinary: deferred  Musculoskeletal: He exhibits no edema.  Lymphadenopathy:   He has no cervical adenopathy.  Skin: Skin is warm and dry. He is not diaphoretic.  Psychiatric: He has a normal mood and affect. His behavior is normal.         Assessment & Plan:   Physical exam: Screening blood work  reviewed Immunizations   All up  to date Colonoscopy   Up to date  Eye exams   Up to date  Exercise  Yard work, some walking Weight:    BMI good for age Skin   sees derm annually  - Dr Ubaldo Glassing Substance abuse  Wine at night - 4 glasses/night-really does not want to cut down  See Problem List for Assessment and Plan of chronic medical problems.   FU in 1 year

## 2018-12-09 NOTE — Patient Instructions (Addendum)
We reviewed your blood work. Have the COVID antibody test done.    All other Health Maintenance issues reviewed.   All recommended immunizations and age-appropriate screenings are up-to-date or discussed.  No immunization administered today.   Medications reviewed and updated.  Changes include :  none    Please followup in 1 year  Health Maintenance, Male Adopting a healthy lifestyle and getting preventive care are important in promoting health and wellness. Ask your health care provider about:  The right schedule for you to have regular tests and exams.  Things you can do on your own to prevent diseases and keep yourself healthy. What should I know about diet, weight, and exercise? Eat a healthy diet   Eat a diet that includes plenty of vegetables, fruits, low-fat dairy products, and lean protein.  Do not eat a lot of foods that are high in solid fats, added sugars, or sodium. Maintain a healthy weight Body mass index (BMI) is a measurement that can be used to identify possible weight problems. It estimates body fat based on height and weight. Your health care provider can help determine your BMI and help you achieve or maintain a healthy weight. Get regular exercise Get regular exercise. This is one of the most important things you can do for your health. Most adults should:  Exercise for at least 150 minutes each week. The exercise should increase your heart rate and make you sweat (moderate-intensity exercise).  Do strengthening exercises at least twice a week. This is in addition to the moderate-intensity exercise.  Spend less time sitting. Even light physical activity can be beneficial. Watch cholesterol and blood lipids Have your blood tested for lipids and cholesterol at 70 years of age, then have this test every 5 years. You may need to have your cholesterol levels checked more often if:  Your lipid or cholesterol levels are high.  You are older than 70 years of  age.  You are at high risk for heart disease. What should I know about cancer screening? Many types of cancers can be detected early and may often be prevented. Depending on your health history and family history, you may need to have cancer screening at various ages. This may include screening for:  Colorectal cancer.  Prostate cancer.  Skin cancer.  Lung cancer. What should I know about heart disease, diabetes, and high blood pressure? Blood pressure and heart disease  High blood pressure causes heart disease and increases the risk of stroke. This is more likely to develop in people who have high blood pressure readings, are of African descent, or are overweight.  Talk with your health care provider about your target blood pressure readings.  Have your blood pressure checked: ? Every 3-5 years if you are 31-24 years of age. ? Every year if you are 90 years old or older.  If you are between the ages of 33 and 11 and are a current or former smoker, ask your health care provider if you should have a one-time screening for abdominal aortic aneurysm (AAA). Diabetes Have regular diabetes screenings. This checks your fasting blood sugar level. Have the screening done:  Once every three years after age 51 if you are at a normal weight and have a low risk for diabetes.  More often and at a younger age if you are overweight or have a high risk for diabetes. What should I know about preventing infection? Hepatitis B If you have a higher risk for hepatitis B, you  should be screened for this virus. Talk with your health care provider to find out if you are at risk for hepatitis B infection. Hepatitis C Blood testing is recommended for:  Everyone born from [birth year] through 1967.  Anyone with known risk factors for hepatitis C. Sexually transmitted infections (STIs)  You should be screened each year for STIs, including gonorrhea and chlamydia, if: ? You are sexually active and are younger  than 70 years of age. ? You are older than 70 years of age and your health care provider tells you that you are at risk for this type of infection. ? Your sexual activity has changed since you were last screened, and you are at increased risk for chlamydia or gonorrhea. Ask your health care provider if you are at risk.  Ask your health care provider about whether you are at high risk for HIV. Your health care provider may recommend a prescription medicine to help prevent HIV infection. If you choose to take medicine to prevent HIV, you should first get tested for HIV. You should then be tested every 3 months for as long as you are taking the medicine. Follow these instructions at home: Lifestyle  Do not use any products that contain nicotine or tobacco, such as cigarettes, e-cigarettes, and chewing tobacco. If you need help quitting, ask your health care provider.  Do not use street drugs.  Do not share needles.  Ask your health care provider for help if you need support or information about quitting drugs. Alcohol use  Do not drink alcohol if your health care provider tells you not to drink.  If you drink alcohol: ? Limit how much you have to 0-2 drinks a day. ? Be aware of how much alcohol is in your drink. In the [place] one drink equals one 12 oz bottle of beer (355 mL), one 5 oz glass of wine (148 mL), or one 1 oz glass of hard liquor (44 mL). General instructions  Schedule regular health, dental, and eye exams.  Stay current with your vaccines.  Tell your health care provider if: ? You often feel depressed. ? You have ever been abused or do not feel safe at home. Summary  Adopting a healthy lifestyle and getting preventive care are important in promoting health and wellness.  Follow your health care provider's instructions about healthy diet, exercising, and getting tested or screened for diseases.  Follow your health care provider's instructions on monitoring your  cholesterol and blood pressure. This information is not intended to replace advice given to you by your health care provider. Make sure you discuss any questions you have with your health care provider. Document Released: 11/01/2007 Document Revised: 04/28/2018 Document Reviewed: 04/28/2018 Elsevier Patient Education  2020 [employer].

## 2018-12-10 ENCOUNTER — Other Ambulatory Visit: Payer: PPO

## 2018-12-10 ENCOUNTER — Encounter: Payer: Self-pay | Admitting: Internal Medicine

## 2018-12-10 ENCOUNTER — Other Ambulatory Visit: Payer: Self-pay

## 2018-12-10 ENCOUNTER — Ambulatory Visit (INDEPENDENT_AMBULATORY_CARE_PROVIDER_SITE_OTHER): Payer: PPO | Admitting: Internal Medicine

## 2018-12-10 VITALS — BP 136/84 | HR 55 | Temp 98.2°F | Resp 16 | Ht 76.0 in | Wt 222.8 lb

## 2018-12-10 DIAGNOSIS — I10 Essential (primary) hypertension: Secondary | ICD-10-CM

## 2018-12-10 DIAGNOSIS — R7303 Prediabetes: Secondary | ICD-10-CM

## 2018-12-10 DIAGNOSIS — E782 Mixed hyperlipidemia: Secondary | ICD-10-CM

## 2018-12-10 DIAGNOSIS — Z0000 Encounter for general adult medical examination without abnormal findings: Secondary | ICD-10-CM

## 2018-12-10 DIAGNOSIS — I480 Paroxysmal atrial fibrillation: Secondary | ICD-10-CM

## 2018-12-10 DIAGNOSIS — F1020 Alcohol dependence, uncomplicated: Secondary | ICD-10-CM | POA: Diagnosis not present

## 2018-12-10 DIAGNOSIS — Z1159 Encounter for screening for other viral diseases: Secondary | ICD-10-CM

## 2018-12-10 NOTE — Assessment & Plan Note (Signed)
BP well controlled Current regimen effective and well tolerated Continue current medications at current doses CMP 

## 2018-12-10 NOTE — Assessment & Plan Note (Signed)
Drinks 4 glasses of wine a night and is do not want to decrease [place] he absolutely has to He knows he is drinking more than he ideally should

## 2018-12-10 NOTE — Assessment & Plan Note (Signed)
Cholesterol is good Diet controlled

## 2018-12-10 NOTE — Assessment & Plan Note (Signed)
A1c in the normal range Low sugar/carbohydrate diet, continue increased activity and exercise

## 2018-12-10 NOTE — Assessment & Plan Note (Signed)
Following with cardiology Taking metoprolol, Eliquis without side effects Rate controlled No episodes of palpitations or known A. fib CMP, CBC normal

## 2018-12-11 ENCOUNTER — Encounter: Payer: Self-pay | Admitting: Internal Medicine

## 2018-12-11 LAB — SAR COV2 SEROLOGY (COVID19)AB(IGG),IA: SARS CoV2 AB IGG: NEGATIVE

## 2019-01-12 ENCOUNTER — Ambulatory Visit: Payer: PPO | Admitting: Family Medicine

## 2019-02-09 ENCOUNTER — Other Ambulatory Visit: Payer: Self-pay | Admitting: Cardiovascular Disease

## 2019-03-22 NOTE — Progress Notes (Signed)
Subjective:    Patient ID: [name] [name], male    DOB: 02-Mar-1951, 68 y.o.   MRN: [id number]  HPI The patient is here for an acute visit.   Bleeding under toenail:  Three weeks ago he noticed his right first toenail looked bruised at the nailbed  And up the medial aspect of the nail.  He does have a chronic ingrown nail there and has some pain from that which is chronic, but this pain is unchanged.  He denies new pain.  He denies trauma.  There has ben no change in the appearance of the nail in the past three weeks.    Medications and allergies reviewed with patient and updated if appropriate.  Patient Active Problem List   Diagnosis Date Noted  . Current use of long term anticoagulation 01/20/2018  . EtOH dependence ([place]) 07/30/2017  . Paroxysmal atrial fibrillation ([place]) 07/29/2017  . Varicose veins of left lower extremity with complications [id number]  . Greater trochanteric bursitis of right hip 01/02/2017  . Prediabetes 09/20/2016  . Rotator cuff syndrome of right shoulder 09/27/2015  . Vitamin D deficiency 09/07/2014  . Lichenoid keratosis [id number]  . Superficial thrombosis of leg 03/28/2013  . 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 Inguinal hernia recurrent unilateral 08/10/2009  . History of colonic polyps 05/15/2008  . Essential hypertension 05/15/2008  . HYPERLIPIDEMIA 05/12/2007  . Elevated prostate specific antigen (PSA) 05/12/2007    Current Outpatient Medications on File Prior to Visit  Medication Sig Dispense Refill  . apixaban (ELIQUIS) 5 MG TABS tablet Take 1 tablet (5 mg total) by mouth 2 (two) times daily. 180 tablet 1  . metoprolol succinate (TOPROL-XL) 25 MG 24 hr tablet Take 1 tablet by mouth once daily 90 tablet 0   No current facility-administered medications on file prior to visit.     Past Medical History:  Diagnosis Date  . Atrial fibrillation with RVR (HCC)    Converted to NSR with lopressor  . Benign prostatic hypertrophy with elevated PSA    Dr Alinda Money, High grade PIN w/o malignancy  . History of colonic polyps 2003, 2007   hyperplastic  . Hx of colonoscopy 2012   neg  . Hyperlipidemia   . Hypertension   . Retinal vascular occlusion    Dr Delman Cheadle    Past Surgical History:  Procedure Laterality Date  . APPENDECTOMY    . COLONOSCOPY W/ POLYPECTOMY      X2 , Dr Deatra Ina  . ENDOVENOUS ABLATION SAPHENOUS VEIN W/ LASER Left 09/29/2017   endovenous laser ablation left greater saphenous vein by Tinnie Gens MD   . HERNIA REPAIR  [phone]    Dr Margot Chimes  . lichenoid keratosis resected  8/14  . LIPOMA EXCISION     X 5  . PROSTATE BIOPSY  09/2010   Dr Alinda Money  . stab phlebectomy  Left 01/27/2018   stab phlebectomy 10-20 incisions left leg by Ruta Hinds MD    Social History   Socioeconomic History  . Marital status: Married    Spouse name: Not on file  . Number of children: Not on file  . Years of education: Not on file  . Highest education level: Not on file  Occupational History  . Not on file  Social Needs  . Financial resource strain: Not on file  . Food insecurity    Worry: Not on file    Inability: Not on file  . Transportation needs    Medical: Not on file  Non-medical: Not on file  Tobacco Use  . Smoking status: Never Smoker  . Smokeless tobacco: Never Used  Substance and Sexual Activity  . Alcohol use: Yes    Alcohol/week: 3.0 standard drinks    Types: 3 Glasses of wine [place] week    Comment:  4 glasses of wine/day in the past, < 1/day now  . Drug use: No  . Sexual activity: Not on file  Lifestyle  . Physical activity    Days [place] week: Not on file    Minutes [place] session: Not on file  . Stress: Not on file  Relationships  . Social Herbalist on phone: Not on file    Gets together: Not on file    Attends religious service: Not on file    Active member of club or organization: Not on file    Attends meetings of clubs or organizations: Not on file    Relationship status: Not on file   Other Topics Concern  . Not on file  Social History Narrative  . Not on file    Family History  Problem Relation Age of Onset  . Alzheimer's disease Mother   . Heart attack Father 73  . Hypertension Brother   . Hypertension Maternal Grandmother   . Diabetes Neg Hx   . Stroke Neg Hx     Review of Systems [place] HPI    Objective:   Vitals:   03/23/19 1445  BP: (!) 142/80  Pulse: 66  Resp: 16  Temp: 98.2 F (36.8 C)  SpO2: 99%   BP Readings from Last 3 Encounters:  03/23/19 (!) 142/80  12/10/18 136/84  11/24/18 122/72   Wt Readings from Last 3 Encounters:  03/23/19 228 lb (103.4 kg)  12/10/18 222 lb 12.8 oz (101.1 kg)  11/24/18 225 lb (102.1 kg)   Body mass index is 27.75 kg/m.   Physical Exam Constitutional:      General: He is not in acute distress.    Appearance: Normal appearance. He is not ill-appearing.  HENT:     Head: Normocephalic and atraumatic.  Skin:    General: Skin is warm and dry.     Findings: Bruising (right first toenail at nailbed and extending up medial aspect of nail about half way up, nail tender from ingrown nail.  nailbed not tender, minimal erythema at nailbed, no nailbed or nail swelling) present.  Neurological:     Mental Status: He is alert.     Sensory: No sensory deficit.            Assessment & Plan:    See Problem List for Assessment and Plan of chronic medical problems.

## 2019-03-23 ENCOUNTER — Other Ambulatory Visit: Payer: Self-pay

## 2019-03-23 ENCOUNTER — Ambulatory Visit (INDEPENDENT_AMBULATORY_CARE_PROVIDER_SITE_OTHER): Payer: PPO | Admitting: Internal Medicine

## 2019-03-23 ENCOUNTER — Encounter: Payer: Self-pay | Admitting: Internal Medicine

## 2019-03-23 VITALS — BP 142/80 | HR 66 | Temp 98.2°F | Resp 16 | Ht 76.0 in | Wt 228.0 lb

## 2019-03-23 DIAGNOSIS — L609 Nail disorder, unspecified: Secondary | ICD-10-CM | POA: Insufficient documentation

## 2019-03-23 DIAGNOSIS — L600 Ingrowing nail: Secondary | ICD-10-CM

## 2019-03-23 NOTE — Assessment & Plan Note (Signed)
Right first toenail with what appears to be bruising- he denies trauma Does not appear to be melanoma Will be seeing podiatry and they can confirm this is nothing concerning

## 2019-03-23 NOTE — Assessment & Plan Note (Signed)
Wants to get it removed at some point because of chronic pain Referred to podiatry

## 2019-03-23 NOTE — Patient Instructions (Signed)
A referral to podiatry was ordered.

## 2019-03-31 ENCOUNTER — Encounter: Payer: Self-pay | Admitting: Podiatry

## 2019-03-31 ENCOUNTER — Ambulatory Visit: Payer: PPO | Admitting: Podiatry

## 2019-03-31 ENCOUNTER — Other Ambulatory Visit: Payer: Self-pay

## 2019-03-31 VITALS — BP 159/95 | HR 64

## 2019-03-31 DIAGNOSIS — L603 Nail dystrophy: Secondary | ICD-10-CM

## 2019-03-31 DIAGNOSIS — M79675 Pain in left toe(s): Secondary | ICD-10-CM | POA: Diagnosis not present

## 2019-03-31 DIAGNOSIS — L600 Ingrowing nail: Secondary | ICD-10-CM | POA: Diagnosis not present

## 2019-03-31 NOTE — Patient Instructions (Signed)

## 2019-04-01 NOTE — Progress Notes (Signed)
Subjective:   Patient ID: [name] [name], male   DOB: 68 y.o.   MRN: [id number]   HPI 68 year old male presents the office today for concerns of ingrown toenail as well as discoloration of his left big toe, lateral aspect.  He says the area has become tender.  He has noticed some blood within the nail and some dark discoloration is not sure what is causing this but he wants to have the area checked.  He tries to keep the nail trimmed himself.  He said no other significant treatment.  Patient currently not 15 years but has been symptomatic over the last 1 month.   Review of Systems  All other systems reviewed and are negative.  Past Medical History:  Diagnosis Date  . Atrial fibrillation with RVR (HCC)    Converted to NSR with lopressor  . Benign prostatic hypertrophy with elevated PSA    Dr Alinda Money, High grade PIN w/o malignancy  . History of colonic polyps 2003, 2007   hyperplastic  . Hx of colonoscopy 2012   neg  . Hyperlipidemia   . Hypertension   . Retinal vascular occlusion    Dr Delman Cheadle    Past Surgical History:  Procedure Laterality Date  . APPENDECTOMY    . COLONOSCOPY W/ POLYPECTOMY      X2 , Dr Deatra Ina  . ENDOVENOUS ABLATION SAPHENOUS VEIN W/ LASER Left 09/29/2017   endovenous laser ablation left greater saphenous vein by Tinnie Gens MD   . HERNIA REPAIR  [phone]    Dr Margot Chimes  . lichenoid keratosis resected  8/14  . LIPOMA EXCISION     X 5  . PROSTATE BIOPSY  09/2010   Dr Alinda Money  . stab phlebectomy  Left 01/27/2018   stab phlebectomy 10-20 incisions left leg by Ruta Hinds MD     Current Outpatient Medications:  .  Cholecalciferol (VITAMIN D) 50 MCG (2000 UT) CAPS, , Disp: , Rfl:  .  Misc Natural Products (TART CHERRY ADVANCED) CAPS, , Disp: , Rfl:  .  apixaban (ELIQUIS) 5 MG TABS tablet, Take 1 tablet (5 mg total) by mouth 2 (two) times daily., Disp: 180 tablet, Rfl: 1 .  metoprolol succinate (TOPROL-XL) 25 MG 24 hr tablet, Take 1 tablet by mouth once  daily, Disp: 90 tablet, Rfl: 0  No Known Allergies        Objective:  Physical Exam  General: AAO x3, NAD  Dermatological: Incurvation present to lateral aspect of the left hallux toenail.  Localized edema and mild erythema but this is more from inflammation as opposed to infection.  No drainage or pus identified today no ascending fevers.  There is darkening discoloration hyperpigmented changes to the nail border but this appears to be more from subungual hematoma.  Upon debridement the nail there was no extension of hyper pigmentation to the nail bed.  Vascular: Dorsalis Pedis artery and Posterior Tibial artery pedal pulses are 2/4 bilateral with immedate capillary fill time.  There is no pain with calf compression, swelling, warmth, erythema.   Neruologic: Grossly intact via light touch bilateral.   Musculoskeletal: No gross boney pedal deformities bilateral. No pain, crepitus, or limitation noted with foot and ankle range of motion bilateral. Muscular strength 5/5 in all groups tested bilateral.  Gait: Unassisted, Nonantalgic.       Assessment:   Ingrown toenail of toenail discoloration; on Eliquis     Plan:  -Treatment options discussed including all alternatives, risks, and complications -Etiology of symptoms were discussed -  At this time, the patient is requesting partial nail removal with chemical matricectomy to the symptomatic portion of the nail. Risks and complications were discussed with the patient for which they understand and written consent was obtained. Under sterile conditions a total of 3 mL of a mixture of 2% lidocaine plain and 0.5% Marcaine plain was infiltrated in a hallux block fashion. Once anesthetized, the skin was prepped in sterile fashion. A tourniquet was then applied. Next the latereal aspect of hallux nail border was then sharply excised making sure to remove the entire offending nail border. Once the nails were ensured to be removed area was debrided  and the underlying skin was intact. There is no purulence identified in the procedure. Next phenol was then applied under standard conditions and copiously irrigated. Silvadene was applied. A dry sterile dressing was applied. After application of the dressing the tourniquet was removed and there is found to be an immediate capillary refill time to the digit. The patient tolerated the procedure well any complications. Post procedure instructions were discussed the patient for which he verbally understood. Follow-up in one week for nail check or sooner if any problems are to arise. Discussed signs/symptoms of infection and directed to call the office immediately should any occur or go directly to the emergency room. In the meantime, encouraged to call the office with any questions, concerns, changes symptoms. -Nail sent for biopsy.   Trula Slade DPM

## 2019-04-08 ENCOUNTER — Other Ambulatory Visit: Payer: Self-pay

## 2019-04-11 ENCOUNTER — Other Ambulatory Visit: Payer: Self-pay

## 2019-04-11 ENCOUNTER — Ambulatory Visit (INDEPENDENT_AMBULATORY_CARE_PROVIDER_SITE_OTHER): Payer: PPO | Admitting: Podiatry

## 2019-04-11 DIAGNOSIS — L600 Ingrowing nail: Secondary | ICD-10-CM

## 2019-04-11 DIAGNOSIS — L603 Nail dystrophy: Secondary | ICD-10-CM

## 2019-04-11 NOTE — Patient Instructions (Signed)

## 2019-04-18 NOTE — Progress Notes (Signed)
Subjective: [name] [name] is a 68 y.o.  male returns to office today for follow up evaluation after having left Hallux partial nail avulsion performed. Patient has been soaking using epsom salts and applying topical antibiotic covered with bandaid daily. Patient denies fevers, chills, nausea, vomiting. Denies any calf pain, chest pain, SOB.   Objective:  Vitals: Reviewed  General: Well developed, nourished, in no acute distress, alert and oriented x3   Dermatology: Skin is warm, dry and supple bilateral. LEFT hallux nail border appears to be clean, dry, with mild granular tissue and surrounding scab. There is no surrounding erythema, edema, drainage/purulence. The remaining nails appear unremarkable at this time. There are no other lesions or other signs of infection present.  Neurovascular status: Intact. No lower extremity swelling; No pain with calf compression bilateral.  Musculoskeletal: Decreased tenderness to palpation of the lateral hallux nail fold. Muscular strength within normal limits bilateral.   Assesement and Plan: S/p partial nail avulsion, doing well.   -Continue soaking in epsom salts twice a day followed by antibiotic ointment and a band-aid. Can leave uncovered at night. Continue this until completely healed.  -If the area has not healed in 2 weeks, call the office for follow-up appointment, or sooner if any problems arise.  -I discussed nail culture results with him as well.  Did show subungual hematoma.  No evidence of melanoma at this time.  However there is still some amount of dried blood at the base of the nail.  We need to monitor this and want to grow well.  If it is not growing out or getting worse need to rebiopsy the area.  He can watch the area closely. -Monitor for any signs/symptoms of infection. Call the office immediately if any occur or go directly to the emergency room. Call with any questions/concerns.  Celesta Gentile, DPM

## 2019-04-20 ENCOUNTER — Encounter: Payer: Self-pay | Admitting: Cardiovascular Disease

## 2019-04-20 ENCOUNTER — Ambulatory Visit: Payer: PPO | Admitting: Cardiovascular Disease

## 2019-04-20 ENCOUNTER — Other Ambulatory Visit: Payer: Self-pay

## 2019-04-20 VITALS — BP 138/78 | HR 61 | Temp 97.4°F | Ht 76.0 in | Wt 228.0 lb

## 2019-04-20 DIAGNOSIS — Z7901 Long term (current) use of anticoagulants: Secondary | ICD-10-CM | POA: Diagnosis not present

## 2019-04-20 DIAGNOSIS — I10 Essential (primary) hypertension: Secondary | ICD-10-CM

## 2019-04-20 DIAGNOSIS — I480 Paroxysmal atrial fibrillation: Secondary | ICD-10-CM | POA: Diagnosis not present

## 2019-04-20 NOTE — Patient Instructions (Signed)
Medication Instructions:  Take the Toprol in the evening  *If you need a refill on your cardiac medications before your next appointment, please call your pharmacy*  Lab Work: None ordered If you have labs (blood work) drawn today and your tests are completely normal, you will receive your results only by: Marland Kitchen MyChart Message (if you have MyChart) OR . A paper copy in the mail If you have any lab test that is abnormal or we need to change your treatment, we will call you to review the results.  Testing/Procedures: None ordered  Follow-Up: At [facility], you and your health needs are our priority.  As part of our continuing mission to provide you with exceptional heart care, we have created designated Provider Care Teams.  These Care Teams include your primary Cardiologist (physician) and Advanced Practice Providers (APPs -  Physician Assistants and Nurse Practitioners) who all work together to provide you with the care you need, when you need it.  Your next appointment:   12 month(s)  The format for your next appointment:   In Person  Provider:   Sanda Klein, MD

## 2019-04-20 NOTE — Progress Notes (Signed)
Cardiology Office Note:    Date:  04/22/2019   ID:  [name] [name], DOB 1950-08-30, MRN [id number]  PCP:  Binnie Rail, MD  Cardiologist:  Sanda Klein, MD  Electrophysiologist:  None   Referring MD: Binnie Rail, MD   Chief Complaint  Patient presents with  . Follow-up    12 months.    History of Present Illness:    [name] [name] is a 68 y.o. male with a hx of paroxysmal atrial fibrillation led to a brief hospitalization in March 2019, without clinical recurrence.  He has systemic hypertension but no structural heart disease other than mild left ventricular hypertrophy.  His left atrium was normal in size and left ventricular systolic function is normal.  He does not have significant valvular abnormalities.  Subsequent evaluation has shown sinus rhythm with ventricular bigeminy which is asymptomatic.  He has had ablation of the right greater saphenous vein, but does not have a history of DVT.  He described fatigue with the beta-blockers and the dose of Lopressor was reduced to 12.5 mg twice daily which led to improvement in his symptoms.    About 1 or 2 months ago he had an episode of faster heartbeat around 105 bpm lasted for a few hours.  This was not associated with near syncope, dyspnea or angina.  Uses naproxen occasionally for right hip pain.  He understands that this can lead to worsening renal function as well as bleeding complications.  He is noticed that his blood pressure tends to be higher in the mornings but is normal during the rest of the day.  The patient specifically denies any chest pain at rest exertion, dyspnea at rest or with exertion, orthopnea, paroxysmal nocturnal dyspnea, syncope,  focal neurological deficits, intermittent claudication, lower extremity edema, unexplained weight gain, cough, hemoptysis or wheezing.   Past Medical History:  Diagnosis Date  . Atrial fibrillation with RVR (HCC)    Converted to NSR with lopressor  . Benign prostatic  hypertrophy with elevated PSA    Dr Alinda Money, High grade PIN w/o malignancy  . History of colonic polyps 2003, 2007   hyperplastic  . Hx of colonoscopy 2012   neg  . Hyperlipidemia   . Hypertension   . Retinal vascular occlusion    Dr Delman Cheadle    Past Surgical History:  Procedure Laterality Date  . APPENDECTOMY    . COLONOSCOPY W/ POLYPECTOMY      X2 , Dr Deatra Ina  . ENDOVENOUS ABLATION SAPHENOUS VEIN W/ LASER Left 09/29/2017   endovenous laser ablation left greater saphenous vein by Tinnie Gens MD   . HERNIA REPAIR  [phone]    Dr Margot Chimes  . lichenoid keratosis resected  8/14  . LIPOMA EXCISION     X 5  . PROSTATE BIOPSY  09/2010   Dr Alinda Money  . stab phlebectomy  Left 01/27/2018   stab phlebectomy 10-20 incisions left leg by Ruta Hinds MD    Current Medications: Current Meds  Medication Sig  . apixaban (ELIQUIS) 5 MG TABS tablet Take 1 tablet (5 mg total) by mouth 2 (two) times daily.  . Cholecalciferol (VITAMIN D) 50 MCG (2000 UT) CAPS   . metoprolol succinate (TOPROL-XL) 25 MG 24 hr tablet Take 1 tablet by mouth once daily  . Misc Natural Products (TART CHERRY ADVANCED) CAPS      Allergies:   Patient has no known allergies.   Social History   Socioeconomic History  . Marital status: Married  Spouse name: Not on file  . Number of children: Not on file  . Years of education: Not on file  . Highest education level: Not on file  Occupational History  . Not on file  Social Needs  . Financial resource strain: Not on file  . Food insecurity    Worry: Not on file    Inability: Not on file  . Transportation needs    Medical: Not on file    Non-medical: Not on file  Tobacco Use  . Smoking status: Never Smoker  . Smokeless tobacco: Never Used  Substance and Sexual Activity  . Alcohol use: Yes    Alcohol/week: 3.0 standard drinks    Types: 3 Glasses of wine Manuel week    Comment:  4 glasses of wine/day in the past, < 1/day now  . Drug use: No  . Sexual activity:  Not on file  Lifestyle  . Physical activity    Days [place] week: Not on file    Minutes Brennen session: Not on file  . Stress: Not on file  Relationships  . Social Herbalist on phone: Not on file    Gets together: Not on file    Attends religious service: Not on file    Active member of club or organization: Not on file    Attends meetings of clubs or organizations: Not on file    Relationship status: Not on file  Other Topics Concern  . Not on file  Social History Narrative  . Not on file     Family History: The patient's family history includes Alzheimer's disease in his mother; Heart attack (age of onset: 52) in his father; Hypertension in his brother and maternal grandmother. There is no history of Diabetes or Stroke.  ROS:   Please see the history of present illness.    All other systems are reviewed and are negative.  EKGs/Labs/Other Studies Reviewed:    EKG:  EKG is ordered today.  It shows normal sinus rhythm, normal tracing  Recent Labs: 11/22/2018: ALT 37; BUN 17; Creatinine, Ser 0.90; Hemoglobin 14.6; Platelets 203.0; Potassium 4.5; Sodium 141; TSH 1.90  Recent Lipid Panel    Component Value Date/Time   CHOL 161 11/22/2018 0851   TRIG 45.0 11/22/2018 0851   HDL 73.90 11/22/2018 0851   CHOLHDL 2 11/22/2018 0851   VLDL 9.0 11/22/2018 0851   LDLCALC 78 11/22/2018 0851   LDLDIRECT 108.9 05/17/2009 0000    Physical Exam:    VS:  BP 138/78 (BP Location: Left Arm, Patient Position: Sitting, Cuff Size: Normal)   Pulse 61   Temp (!) 97.4 F (36.3 C)   Ht 6\' 4"  (1.93 m)   Wt 228 lb (103.4 kg)   BMI 27.75 kg/m     Wt Readings from Last 3 Encounters:  04/20/19 228 lb (103.4 kg)  03/23/19 228 lb (103.4 kg)  12/10/18 222 lb 12.8 oz (101.1 kg)     General: Alert, oriented x3, no distress, appears well.  Moderately overweight. Head: no evidence of trauma, PERRL, EOMI, no exophtalmos or lid lag, no myxedema, no xanthelasma; normal ears, nose and  oropharynx Neck: normal jugular venous pulsations and no hepatojugular reflux; brisk carotid pulses without delay and no carotid bruits Chest: clear to auscultation, no signs of consolidation by percussion or palpation, normal fremitus, symmetrical and full respiratory excursions Cardiovascular: normal position and quality of the apical impulse, regular rhythm, normal first and second heart sounds, no murmurs, rubs or  gallops Abdomen: no tenderness or distention, no masses by palpation, no abnormal pulsatility or arterial bruits, normal bowel sounds, no hepatosplenomegaly Extremities: no clubbing, cyanosis or edema; 2+ radial, ulnar and brachial pulses bilaterally; 2+ right femoral, posterior tibial and dorsalis pedis pulses; 2+ left femoral, posterior tibial and dorsalis pedis pulses; no subclavian or femoral bruits Neurological: grossly nonfocal Psych: Normal mood and affect    ASSESSMENT:    1. Paroxysmal atrial fibrillation (HCC)   2. Essential hypertension   3. Current use of long term anticoagulation    PLAN:    In order of problems listed above:  1. AFib: Very infrequent and brief episodes of tachycardia/palpitations.  Did not tolerate higher doses of beta-blocker as well.   2. HTN: We will try to switch the beta-blocker to the evening to see if this controls his early morning elevated blood pressure. 3. Eliquis: Easy bruising, but no serious bleeding complications.   Medication Adjustments/Labs and Tests Ordered: Current medicines are reviewed at length with the patient today.  Concerns regarding medicines are outlined above.  Orders Placed This Encounter  Procedures  . EKG 12-Lead   No orders of the defined types were placed in this encounter.   Patient Instructions  Medication Instructions:  Take the Toprol in the evening  *If you need a refill on your cardiac medications before your next appointment, please call your pharmacy*  Lab Work: None ordered If you have  labs (blood work) drawn today and your tests are completely normal, you will receive your results only by: Marland Kitchen MyChart Message (if you have MyChart) OR . A paper copy in the mail If you have any lab test that is abnormal or we need to change your treatment, we will call you to review the results.  Testing/Procedures: None ordered  Follow-Up: At [facility], you and your health needs are our priority.  As part of our continuing mission to provide you with exceptional heart care, we have created designated Provider Care Teams.  These Care Teams include your primary Cardiologist (physician) and Advanced Practice Providers (APPs -  Physician Assistants and Nurse Practitioners) who all work together to provide you with the care you need, when you need it.  Your next appointment:   12 month(s)  The format for your next appointment:   In Person  Provider:   Sanda Klein, MD       Signed, Sanda Klein, MD  04/22/2019 7:40 PM    [place]

## 2019-04-26 DIAGNOSIS — R972 Elevated prostate specific antigen [PSA]: Secondary | ICD-10-CM | POA: Diagnosis not present

## 2019-04-26 MED ORDER — IRBESARTAN 75 MG PO TABS: 75.0000 mg | ORAL_TABLET | Freq: Every day | ORAL | 6 refills | Status: DC

## 2019-05-03 DIAGNOSIS — N401 Enlarged prostate with lower urinary tract symptoms: Secondary | ICD-10-CM | POA: Diagnosis not present

## 2019-05-03 DIAGNOSIS — R3912 Poor urinary stream: Secondary | ICD-10-CM | POA: Diagnosis not present

## 2019-05-03 DIAGNOSIS — R972 Elevated prostate specific antigen [PSA]: Secondary | ICD-10-CM | POA: Diagnosis not present

## 2019-05-03 DIAGNOSIS — N5201 Erectile dysfunction due to arterial insufficiency: Secondary | ICD-10-CM | POA: Diagnosis not present

## 2019-05-17 ENCOUNTER — Other Ambulatory Visit: Payer: Self-pay | Admitting: Cardiovascular Disease

## 2019-05-24 ENCOUNTER — Other Ambulatory Visit: Payer: Self-pay | Admitting: Cardiovascular Disease

## 2019-05-25 DIAGNOSIS — D2239 Melanocytic nevi of other parts of face: Secondary | ICD-10-CM | POA: Diagnosis not present

## 2019-05-25 DIAGNOSIS — L821 Other seborrheic keratosis: Secondary | ICD-10-CM | POA: Diagnosis not present

## 2019-05-25 DIAGNOSIS — L720 Epidermal cyst: Secondary | ICD-10-CM | POA: Diagnosis not present

## 2019-05-25 DIAGNOSIS — D2262 Melanocytic nevi of left upper limb, including shoulder: Secondary | ICD-10-CM | POA: Diagnosis not present

## 2019-05-25 DIAGNOSIS — L814 Other melanin hyperpigmentation: Secondary | ICD-10-CM | POA: Diagnosis not present

## 2019-05-25 DIAGNOSIS — Z85828 Personal history of other malignant neoplasm of skin: Secondary | ICD-10-CM | POA: Diagnosis not present

## 2019-05-25 DIAGNOSIS — L570 Actinic keratosis: Secondary | ICD-10-CM | POA: Diagnosis not present

## 2019-05-25 DIAGNOSIS — D225 Melanocytic nevi of trunk: Secondary | ICD-10-CM | POA: Diagnosis not present

## 2019-05-25 DIAGNOSIS — L820 Inflamed seborrheic keratosis: Secondary | ICD-10-CM | POA: Diagnosis not present

## 2019-05-25 DIAGNOSIS — D1721 Benign lipomatous neoplasm of skin and subcutaneous tissue of right arm: Secondary | ICD-10-CM | POA: Diagnosis not present

## 2019-05-25 DIAGNOSIS — D2261 Melanocytic nevi of right upper limb, including shoulder: Secondary | ICD-10-CM | POA: Diagnosis not present

## 2019-05-25 DIAGNOSIS — D485 Neoplasm of uncertain behavior of skin: Secondary | ICD-10-CM | POA: Diagnosis not present

## 2019-06-12 ENCOUNTER — Ambulatory Visit: Payer: PPO | Attending: Internal Medicine

## 2019-06-12 DIAGNOSIS — Z23 Encounter for immunization: Secondary | ICD-10-CM

## 2019-06-12 NOTE — Progress Notes (Signed)
   Covid-19 Vaccination Clinic  Name:  [name] [name]    MRN: [id number] DOB: Nov 20, 1950  06/12/2019  [name] was observed post Covid-19 immunization for 15 minutes without incidence. He was provided with Vaccine Information Sheet and instruction to access the V-Safe system.   [name] was instructed to call 911 with any severe reactions post vaccine: Marland Kitchen Difficulty breathing  . Swelling of your face and throat  . A fast heartbeat  . A bad rash all over your body  . Dizziness and weakness    Immunizations Administered    Name Date Dose VIS Date Route   Pfizer COVID-19 Vaccine 06/12/2019 10:09 AM 0.3 mL 04/29/2019 Intramuscular   Manufacturer: [place]   Lot: [id number]   [place]: [id number]

## 2019-07-03 ENCOUNTER — Ambulatory Visit: Payer: PPO | Attending: Internal Medicine

## 2019-07-03 DIAGNOSIS — Z23 Encounter for immunization: Secondary | ICD-10-CM

## 2019-07-03 NOTE — Progress Notes (Signed)
   Covid-19 Vaccination Clinic  Name:  [name] [name]    MRN: [id number] DOB: [birth year]  07/03/2019  Mr. Hensarling was observed post Covid-19 immunization for 15 minutes without incidence. He was provided with Vaccine Information Sheet and instruction to access the V-Safe system.   Mr. Cefalo was instructed to call 911 with any severe reactions post vaccine: Marland Kitchen Difficulty breathing  . Swelling of your face and throat  . A fast heartbeat  . A bad rash all over your body  . Dizziness and weakness    Immunizations Administered    Name Date Dose VIS Date Route   Pfizer COVID-19 Vaccine 07/03/2019 11:27 AM 0.3 mL 04/29/2019 Intramuscular   Manufacturer: [place]   Lot: [id number]   [place]: [id number]

## 2019-07-09 ENCOUNTER — Ambulatory Visit: Payer: PPO

## 2019-07-29 ENCOUNTER — Other Ambulatory Visit: Payer: Self-pay | Admitting: Cardiovascular Disease

## 2019-08-05 ENCOUNTER — Other Ambulatory Visit: Payer: Self-pay | Admitting: Cardiovascular Disease

## 2019-08-25 ENCOUNTER — Other Ambulatory Visit: Payer: Self-pay

## 2019-08-25 ENCOUNTER — Ambulatory Visit: Payer: PPO | Admitting: Family Medicine

## 2019-08-25 ENCOUNTER — Encounter: Payer: Self-pay | Admitting: Family Medicine

## 2019-08-25 ENCOUNTER — Ambulatory Visit (INDEPENDENT_AMBULATORY_CARE_PROVIDER_SITE_OTHER): Payer: PPO

## 2019-08-25 VITALS — BP 134/82 | HR 64 | Ht 76.0 in | Wt 228.0 lb

## 2019-08-25 DIAGNOSIS — M7061 Trochanteric bursitis, right hip: Secondary | ICD-10-CM | POA: Diagnosis not present

## 2019-08-25 DIAGNOSIS — M25551 Pain in right hip: Secondary | ICD-10-CM | POA: Diagnosis not present

## 2019-08-25 NOTE — Progress Notes (Signed)
[name] [name] Phone: [phone] Subjective:   [name] [name], am serving as a scribe for Dr. Hulan Saas. This visit occurred during the SARS-CoV-2 public health emergency.  Safety protocols were in place, including screening questions prior to the visit, additional usage of staff PPE, and extensive cleaning of exam room while observing appropriate contact time as indicated for disinfecting solutions.   I'm seeing this patient by the request  of:  Binnie Rail, MD  CC: Right hip pain follow-up  [id number]   11/24/2018 Patient had injection today.  Has responded very well over time.  Hoping that this will be beneficial.  We have discussed the possibility that this could get worseRadicular symptoms.  Discussed icing regimen, home exercise, hip abductor strengthening.  Follow-up again in 4 to 8 weeks  Update 08/25/2019 [name] [name] is a 69 y.o. male coming in with complaint of right hip pain. Patient states that he has had pain since last fall. Pain over ASIS that radiates into greater trochanter. Feels stiff in FABER position on right side. Pain is intermittent. Sitting increases his pain.  Starting to affect daily activities again.    Past Medical History:  Diagnosis Date  . Atrial fibrillation with RVR (HCC)    Converted to NSR with lopressor  . Benign prostatic hypertrophy with elevated PSA    Dr Alinda Money, High grade PIN w/o malignancy  . History of colonic polyps 2003, 2007   hyperplastic  . Hx of colonoscopy 2012   neg  . Hyperlipidemia   . Hypertension   . Retinal vascular occlusion    Dr Delman Cheadle   Past Surgical History:  Procedure Laterality Date  . APPENDECTOMY    . COLONOSCOPY W/ POLYPECTOMY      X2 , Dr Deatra Ina  . ENDOVENOUS ABLATION SAPHENOUS VEIN W/ LASER Left 09/29/2017   endovenous laser ablation left greater saphenous vein by Tinnie Gens MD   . HERNIA REPAIR  [phone]    Dr Margot Chimes  .  lichenoid keratosis resected  8/14  . LIPOMA EXCISION     X 5  . PROSTATE BIOPSY  09/2010   Dr Alinda Money  . stab phlebectomy  Left 01/27/2018   stab phlebectomy 10-20 incisions left leg by Ruta Hinds MD   Social History   Socioeconomic History  . Marital status: Married    Spouse name: Not on file  . Number of children: Not on file  . Years of education: Not on file  . Highest education level: Not on file  Occupational History  . Not on file  Tobacco Use  . Smoking status: Never Smoker  . Smokeless tobacco: Never Used  Substance and Sexual Activity  . Alcohol use: Yes    Alcohol/week: 3.0 standard drinks    Types: 3 Glasses of wine [place] week    Comment:  4 glasses of wine/day in the past, < 1/day now  . Drug use: [name]  . Sexual activity: Not on file  Other Topics Concern  . Not on file  Social History Narrative  . Not on file   Social Determinants of Health   Financial Resource Strain:   . Difficulty of Paying Living Expenses:   Food Insecurity:   . Worried About Charity fundraiser in the Last Year:   . Arboriculturist in the Last Year:   Transportation Needs:   . Film/video editor (Medical):   Marland Kitchen Lack of Transportation (  Non-Medical):   Physical Activity:   . Days of Exercise [place] Week:   . Minutes of Exercise [place] Session:   Stress:   . Feeling of Stress :   Social Connections:   . Frequency of Communication with Friends and Family:   . Frequency of Social Gatherings with Friends and Family:   . Attends Religious Services:   . Active Member of Clubs or Organizations:   . Attends Archivist Meetings:   Marland Kitchen Marital Status:    [name] Known Allergies Family History  Problem Relation Age of Onset  . Alzheimer's disease Mother   . Heart attack Father 11  . Hypertension Brother   . Hypertension Maternal Grandmother   . Diabetes Neg Hx   . Stroke Neg Hx      Current Outpatient Medications (Cardiovascular):  .  irbesartan (AVAPRO) 75 MG tablet, Take 1  tablet (75 mg total) by mouth daily. .  metoprolol succinate (TOPROL-XL) 25 MG 24 hr tablet, Take 1 tablet by mouth once daily    Current Outpatient Medications (Hematological):  Marland Kitchen  ELIQUIS 5 MG TABS tablet, Take 1 tablet by mouth twice daily  Current Outpatient Medications (Other):  Marland Kitchen  Cholecalciferol (VITAMIN D) 50 MCG (2000 UT) CAPS,  .  Misc Natural Products (TART CHERRY ADVANCED) CAPS,    Reviewed prior external information including notes and imaging from  primary care provider As well as notes that were available from care everywhere and other healthcare systems.  Past medical history, social, surgical and family history all reviewed in electronic medical record.  [name] pertanent information unless stated regarding to the chief complaint.   Review of Systems:  [name] headache, visual changes, nausea, vomiting, diarrhea, constipation, dizziness, abdominal pain, skin rash, fevers, chills, night sweats, weight loss, swollen lymph nodes, body aches, joint swelling, chest pain, shortness of breath, mood changes. POSITIVE muscle aches  Objective  Blood pressure 134/82, pulse 64, height 6\' 4"  (1.93 m), weight 228 lb (103.4 kg), SpO2 99 %.   General: [name] apparent distress alert and oriented x3 mood and affect normal, dressed appropriately.  HEENT: Pupils equal, extraocular movements intact  Respiratory: Patient's speak in full sentences and does not appear short of breath  Cardiovascular: [name] lower extremity edema, non tender, [name] erythema  Neuro: Cranial nerves II through XII are intact, neurovascularly intact in all extremities with 2+ DTRs and 2+ pulses.  Gait mild antalgic MSK:  Right hip exam shows the patient does have tenderness to palpation over the greater trochanteric area and significant tightness with Corky Sox compared to the contralateral side.  Decreased 10 degrees of internal rotation of the hip as well with mild groin pain.  Patient does walk with a minorly external rotated hip.   Neurovascularly intact distally, 5 out of 5 strength.   Procedure: Real-time Ultrasound Guided Injection of right greater trochanteric bursitis secondary to patient's body habitus Device: GE Logiq Q7 Ultrasound guided injection is preferred based studies that show increased duration, increased effect, greater accuracy, decreased procedural pain, increased response rate, and decreased cost with ultrasound guided versus blind injection.  Verbal informed consent obtained.  Time-out conducted.  Noted [name] overlying erythema, induration, or other signs of local infection.  Skin prepped in a sterile fashion.  Local anesthesia: Topical Ethyl chloride.  With sterile technique and under real time ultrasound guidance:  Greater trochanteric area was visualized and patient's bursa was noted. A 22-gauge 3 inch needle was inserted and 4 cc of 0.5% Marcaine and 1 cc of  Kenalog 40 mg/dL was injected. Pictures taken Completed without difficulty  Pain immediately resolved suggesting accurate placement of the medication.  Advised to call if fevers/chills, erythema, induration, drainage, or persistent bleeding.  Images permanently stored and available for review in the ultrasound unit.  Impression: Technically successful ultrasound guided injection.    Impression and Recommendations:     This case required medical decision making of moderate complexity. The above documentation has been reviewed and is accurate and complete Lyndal Pulley, DO       Note: This dictation was prepared with Dragon dictation along with smaller phrase technology. Any transcriptional errors that result from this process are unintentional.

## 2019-08-25 NOTE — Patient Instructions (Signed)
Injected GT today Exercises 3x a week See me again in 8 weeks

## 2019-08-25 NOTE — Assessment & Plan Note (Signed)
Repeat injection given today, tolerated the procedure well.  Chronic problem with exacerbation.  Do believe that there is some underlying osteoarthritic changes that could also be contributing of the hip.  Patient given a home exercises again.  Discussed medication management with patient being on a blood thinner unable to do oral anti-inflammatories but topical anti-inflammatories given, discussed ice.  Patient declined formal physical therapy secondary to social determinants of health and even though vaccinated still trying to avoid significant interactions with the community.  Follow-up again 8 weeks

## 2019-09-19 ENCOUNTER — Telehealth: Payer: Self-pay | Admitting: Internal Medicine

## 2019-09-19 DIAGNOSIS — I480 Paroxysmal atrial fibrillation: Secondary | ICD-10-CM

## 2019-09-19 DIAGNOSIS — I10 Essential (primary) hypertension: Secondary | ICD-10-CM

## 2019-09-19 DIAGNOSIS — E782 Mixed hyperlipidemia: Secondary | ICD-10-CM

## 2019-09-19 DIAGNOSIS — R7303 Prediabetes: Secondary | ICD-10-CM

## 2019-09-19 NOTE — Telephone Encounter (Signed)
    Please enter order for annual labs

## 2019-09-20 NOTE — Telephone Encounter (Signed)
Spoke with pt in regards. Will stop by the [place] location to have labs drawn the week before his appointment.

## 2019-09-20 NOTE — Telephone Encounter (Signed)
Ordered please schedule.

## 2019-10-12 ENCOUNTER — Ambulatory Visit: Payer: PPO | Admitting: Family Medicine

## 2019-10-21 DIAGNOSIS — R972 Elevated prostate specific antigen [PSA]: Secondary | ICD-10-CM | POA: Diagnosis not present

## 2019-11-01 DIAGNOSIS — R972 Elevated prostate specific antigen [PSA]: Secondary | ICD-10-CM | POA: Diagnosis not present

## 2019-11-01 DIAGNOSIS — R3912 Poor urinary stream: Secondary | ICD-10-CM | POA: Diagnosis not present

## 2019-11-01 DIAGNOSIS — N401 Enlarged prostate with lower urinary tract symptoms: Secondary | ICD-10-CM | POA: Diagnosis not present

## 2019-11-03 ENCOUNTER — Other Ambulatory Visit: Payer: Self-pay | Admitting: Cardiovascular Disease

## 2019-11-04 ENCOUNTER — Other Ambulatory Visit: Payer: Self-pay | Admitting: Cardiovascular Disease

## 2019-11-25 ENCOUNTER — Other Ambulatory Visit (INDEPENDENT_AMBULATORY_CARE_PROVIDER_SITE_OTHER): Payer: PPO

## 2019-11-25 DIAGNOSIS — R7303 Prediabetes: Secondary | ICD-10-CM | POA: Diagnosis not present

## 2019-11-25 DIAGNOSIS — I10 Essential (primary) hypertension: Secondary | ICD-10-CM

## 2019-11-25 DIAGNOSIS — I480 Paroxysmal atrial fibrillation: Secondary | ICD-10-CM

## 2019-11-25 DIAGNOSIS — E782 Mixed hyperlipidemia: Secondary | ICD-10-CM | POA: Diagnosis not present

## 2019-11-25 LAB — COMPREHENSIVE METABOLIC PANEL
ALT: 38 U/L (ref 0–53)
AST: 32 U/L (ref 0–37)
Albumin: 4.7 g/dL (ref 3.5–5.2)
Alkaline Phosphatase: 61 U/L (ref 39–117)
BUN: 18 mg/dL (ref 6–23)
CO2: 29 mEq/L (ref 19–32)
Calcium: 9.5 mg/dL (ref 8.4–10.5)
Chloride: 101 mEq/L (ref 96–112)
Creatinine, Ser: 0.85 mg/dL (ref 0.40–1.50)
GFR: 88.8 mL/min (ref >60.00)
Glucose, Bld: 104 mg/dL — ABNORMAL HIGH (ref 70–99)
Potassium: 5.4 mEq/L — ABNORMAL HIGH (ref 3.5–5.1)
Sodium: 138 mEq/L (ref 135–145)
Total Bilirubin: 1 mg/dL (ref 0.2–1.2)
Total Protein: 7.1 g/dL (ref 6.0–8.3)

## 2019-11-25 LAB — LIPID PANEL
Cholesterol: 186 mg/dL (ref 0–200)
HDL: 87.6 mg/dL (ref >39.00)
LDL Cholesterol: 89 mg/dL (ref 0–99)
NonHDL: 98.44
Total CHOL/HDL Ratio: 2
Triglycerides: 49 mg/dL (ref 0.0–149.0)
VLDL: 9.8 mg/dL (ref 0.0–40.0)

## 2019-11-25 LAB — CBC WITH DIFFERENTIAL/PLATELET
Basophils Absolute: 0 10*3/uL (ref 0.0–0.1)
Basophils Relative: 0.7 % (ref 0.0–3.0)
Eosinophils Absolute: 0 10*3/uL (ref 0.0–0.7)
Eosinophils Relative: 0.7 % (ref 0.0–5.0)
HCT: 44.5 % (ref 39.0–52.0)
Hemoglobin: 15.3 g/dL (ref 13.0–17.0)
Lymphocytes Relative: 32.7 % (ref 12.0–46.0)
Lymphs Abs: 2.2 10*3/uL (ref 0.7–4.0)
MCHC: 34.4 g/dL (ref 30.0–36.0)
MCV: 91.8 fl (ref 78.0–100.0)
Monocytes Absolute: 0.5 10*3/uL (ref 0.1–1.0)
Monocytes Relative: 6.9 % (ref 3.0–12.0)
Neutro Abs: 4 10*3/uL (ref 1.4–7.7)
Neutrophils Relative %: 59 % (ref 43.0–77.0)
Platelets: 212 10*3/uL (ref 150.0–400.0)
RBC: 4.85 Mil/uL (ref 4.22–5.81)
RDW: 13.6 % (ref 11.5–15.5)
WBC: 6.9 10*3/uL (ref 4.0–10.5)

## 2019-11-25 LAB — TSH: TSH: 1.99 u[IU]/mL (ref 0.35–4.50)

## 2019-11-25 LAB — HEMOGLOBIN A1C: Hgb A1c MFr Bld: 5.5 % (ref 4.6–6.5)

## 2019-12-13 NOTE — Assessment & Plan Note (Addendum)
Chronic  Lab Results  Component Value Date   HGBA1C 5.5 11/25/2019   Sugars in normal range

## 2019-12-13 NOTE — Progress Notes (Signed)
Subjective:    Patient ID: [name] [name], male    DOB: [birth year], 69 y.o.   MRN: [id number]  HPI He is here for a physical exam.   He pulled his hamstring the end of May - it is 80% better.  If it does not continue to get better he will see Dr Tamala Julian.  He had the covid vaccine in the left arm.  He has swollen LN's in his left axilla.  He is unsure when he noticed it.    He notices a pain in her right upper back by his scapula.  It is not painful.  He feels it when he wakes up and moves in certain ways.  It started about 8 months ago.  It does not limit him.  Medications and allergies reviewed with patient and updated if appropriate.  Patient Active Problem List   Diagnosis Date Noted  . Ingrown toenail of right foot 03/23/2019  . Current use of long term anticoagulation 01/20/2018  . EtOH dependence ([place]) 07/30/2017  . Paroxysmal atrial fibrillation ([place]) 07/29/2017  . Varicose veins of left lower extremity with complications 71/10/2692  . Greater trochanteric bursitis of right hip 01/02/2017  . Prediabetes 09/20/2016  . Rotator cuff syndrome of right shoulder 09/27/2015  . Vitamin D deficiency 09/07/2014  . Lichenoid keratosis 85/46/2703  . Superficial thrombosis of leg 03/28/2013  . 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 Inguinal hernia recurrent unilateral 08/10/2009  . History of colonic polyps 05/15/2008  . Essential hypertension 05/15/2008  . HYPERLIPIDEMIA 05/12/2007  . Elevated prostate specific antigen (PSA) 05/12/2007    Current Outpatient Medications on File Prior to Visit  Medication Sig Dispense Refill  . Cholecalciferol (VITAMIN D) 50 MCG (2000 UT) CAPS     . ELIQUIS 5 MG TABS tablet Take 1 tablet by mouth twice daily 180 tablet 2  . irbesartan (AVAPRO) 75 MG tablet Take 1 tablet by mouth once daily 90 tablet 0  . Melatonin 10 MG TABS     . metoprolol succinate (TOPROL-XL) 25 MG 24 hr tablet Take 1 tablet by mouth once daily 90 tablet 2  . nystatin-triamcinolone  (MYCOLOG II) cream Apply topically 2 (two) times daily. to affected area     No current facility-administered medications on file prior to visit.    Past Medical History:  Diagnosis Date  . Atrial fibrillation with RVR (HCC)    Converted to NSR with lopressor  . Benign prostatic hypertrophy with elevated PSA    Dr Alinda Money, High grade PIN w/o malignancy  . History of colonic polyps 2003, 2007   hyperplastic  . Hx of colonoscopy 2012   neg  . Hyperlipidemia   . Hypertension   . Retinal vascular occlusion    Dr Delman Cheadle    Past Surgical History:  Procedure Laterality Date  . APPENDECTOMY    . COLONOSCOPY W/ POLYPECTOMY      X2 , Dr Deatra Ina  . ENDOVENOUS ABLATION SAPHENOUS VEIN W/ LASER Left 09/29/2017   endovenous laser ablation left greater saphenous vein by Tinnie Gens MD   . HERNIA REPAIR  [phone]    Dr Margot Chimes  . lichenoid keratosis resected  8/14  . LIPOMA EXCISION     X 5  . PROSTATE BIOPSY  09/2010   Dr Alinda Money  . stab phlebectomy  Left 01/27/2018   stab phlebectomy 10-20 incisions left leg by Ruta Hinds MD    Social History   Socioeconomic History  . Marital status: Married  Spouse name: Not on file  . Number of children: Not on file  . Years of education: Not on file  . Highest education level: Not on file  Occupational History  . Not on file  Tobacco Use  . Smoking status: Never Smoker  . Smokeless tobacco: Never Used  Vaping Use  . Vaping Use: Never used  Substance and Sexual Activity  . Alcohol use: Yes    Alcohol/week: 3.0 standard drinks    Types: 3 Glasses of wine [place] week    Comment:  4 glasses of wine/day in the past, < 1/day now  . Drug use: No  . Sexual activity: Not on file  Other Topics Concern  . Not on file  Social History Narrative  . Not on file   Social Determinants of Health   Financial Resource Strain:   . Difficulty of Paying Living Expenses:   Food Insecurity:   . Worried About Charity fundraiser in the Last Year:     . Arboriculturist in the Last Year:   Transportation Needs:   . Film/video editor (Medical):   Marland Kitchen Lack of Transportation (Non-Medical):   Physical Activity:   . Days of Exercise [place] Week:   . Minutes of Exercise [place] Session:   Stress:   . Feeling of Stress :   Social Connections:   . Frequency of Communication with Friends and Family:   . Frequency of Social Gatherings with Friends and Family:   . Attends Religious Services:   . Active Member of Clubs or Organizations:   . Attends Archivist Meetings:   Marland Kitchen Marital Status:     Family History  Problem Relation Age of Onset  . Alzheimer's disease Mother   . Heart attack Father 37  . Hypertension Brother   . Hypertension Maternal Grandmother   . Diabetes Neg Hx   . Stroke Neg Hx     Review of Systems  Constitutional: Negative for chills and fever.  Eyes: Negative for visual disturbance.  Respiratory: Negative for cough, shortness of breath and wheezing.   Cardiovascular: Negative for chest pain, palpitations and leg swelling.  Gastrointestinal: Negative for abdominal pain, blood in stool, constipation, diarrhea and nausea.       No gerd  Genitourinary: Negative for dysuria and hematuria.  Musculoskeletal: Positive for arthralgias (mild knees). Negative for back pain.  Skin: Negative for color change and rash.  Neurological: Negative for dizziness, light-headedness and headaches.  Psychiatric/Behavioral: Negative for dysphoric mood. The patient is not nervous/anxious.        Objective:   Vitals:   12/14/19 1313  BP: (!) 130/84  Pulse: 67  Temp: 98 F (36.7 C)  SpO2: 97%   Filed Weights   12/14/19 1313  Weight: (!) 224 lb (101.6 kg)   Body mass index is 27.27 kg/m.  BP Readings from Last 3 Encounters:  12/14/19 (!) 130/84  08/25/19 134/82  04/20/19 138/78    Wt Readings from Last 3 Encounters:  12/14/19 (!) 224 lb (101.6 kg)  08/25/19 228 lb (103.4 kg)  04/20/19 228 lb (103.4 kg)      Physical Exam Constitutional: He appears well-developed and well-nourished. No distress.  HENT:  Head: Normocephalic and atraumatic.  Right Ear: External ear normal.  Left Ear: External ear normal.  Mouth/Throat: Oropharynx is clear and moist.  Normal ear canals and TM b/l  Eyes: Conjunctivae and EOM are normal.  Neck: Neck supple. No tracheal deviation present. No thyromegaly  present.  No carotid bruit  Cardiovascular: Normal rate, regular rhythm, normal heart sounds and intact distal pulses.   No murmur heard. Pulmonary/Chest: Effort normal and breath sounds normal. No respiratory distress. He has no wheezes. He has no rales.  Abdominal: Soft. He exhibits no distension. There is no tenderness.  Genitourinary: deferred  Musculoskeletal: He exhibits no edema.  Lymphadenopathy:   He has no cervical adenopathy.  Skin: Skin is warm and dry. He is not diaphoretic.  Psychiatric: He has a normal mood and affect. His behavior is normal.         Assessment & Plan:   Physical exam: Screening blood work  reviewed Immunizations  All up to date Colonoscopy   Up to date  Eye exams   Scheduled, up to date Exercise   regular Weight  Ok for age Substance abuse   Drinking wine daily Sees derm annually  See Problem List for Assessment and Plan of chronic medical problems.   This visit occurred during the SARS-CoV-2 public health emergency.  Safety protocols were in place, including screening questions prior to the visit, additional usage of staff PPE, and extensive cleaning of exam room while observing appropriate contact time as indicated for disinfecting solutions.

## 2019-12-13 NOTE — Patient Instructions (Addendum)
Have an xray downstairs today   Medications reviewed and updated.  Changes include :   none     Please followup in 1 year    Health Maintenance, Male Adopting a healthy lifestyle and getting preventive care are important in promoting health and wellness. Ask your health care provider about:  The right schedule for you to have regular tests and exams.  Things you can do on your own to prevent diseases and keep yourself healthy. What should I know about diet, weight, and exercise? Eat a healthy diet   Eat a diet that includes plenty of vegetables, fruits, low-fat dairy products, and lean protein.  Do not eat a lot of foods that are high in solid fats, added sugars, or sodium. Maintain a healthy weight Body mass index (BMI) is a measurement that can be used to identify possible weight problems. It estimates body fat based on height and weight. Your health care provider can help determine your BMI and help you achieve or maintain a healthy weight. Get regular exercise Get regular exercise. This is one of the most important things you can do for your health. Most adults should:  Exercise for at least 150 minutes each week. The exercise should increase your heart rate and make you sweat (moderate-intensity exercise).  Do strengthening exercises at least twice a week. This is in addition to the moderate-intensity exercise.  Spend less time sitting. Even light physical activity can be beneficial. Watch cholesterol and blood lipids Have your blood tested for lipids and cholesterol at 71 years of age, then have this test every 5 years. You may need to have your cholesterol levels checked more often if:  Your lipid or cholesterol levels are high.  You are older than 71 years of age.  You are at high risk for heart disease. What should I know about cancer screening? Many types of cancers can be detected early and may often be prevented. Depending on your health history and family  history, you may need to have cancer screening at various ages. This may include screening for:  Colorectal cancer.  Prostate cancer.  Skin cancer.  Lung cancer. What should I know about heart disease, diabetes, and high blood pressure? Blood pressure and heart disease  High blood pressure causes heart disease and increases the risk of stroke. This is more likely to develop in people who have high blood pressure readings, are of African descent, or are overweight.  Talk with your health care provider about your target blood pressure readings.  Have your blood pressure checked: ? Every 3-5 years if you are 65-66 years of age. ? Every year if you are 29 years old or older.  If you are between the ages of 35 and 33 and are a current or former smoker, ask your health care provider if you should have a one-time screening for abdominal aortic aneurysm (AAA). Diabetes Have regular diabetes screenings. This checks your fasting blood sugar level. Have the screening done:  Once every three years after age 92 if you are at a normal weight and have a low risk for diabetes.  More often and at a younger age if you are overweight or have a high risk for diabetes. What should I know about preventing infection? Hepatitis B If you have a higher risk for hepatitis B, you should be screened for this virus. Talk with your health care provider to find out if you are at risk for hepatitis B infection. Hepatitis C Blood testing  is recommended for:  Everyone born from [birth year] through 1967.  Anyone with known risk factors for hepatitis C. Sexually transmitted infections (STIs)  You should be screened each year for STIs, including gonorrhea and chlamydia, if: ? You are sexually active and are younger than 71 years of age. ? You are older than 71 years of age and your health care provider tells you that you are at risk for this type of infection. ? Your sexual activity has changed since you were last  screened, and you are at increased risk for chlamydia or gonorrhea. Ask your health care provider if you are at risk.  Ask your health care provider about whether you are at high risk for HIV. Your health care provider may recommend a prescription medicine to help prevent HIV infection. If you choose to take medicine to prevent HIV, you should first get tested for HIV. You should then be tested every 3 months for as long as you are taking the medicine. Follow these instructions at home: Lifestyle  Do not use any products that contain nicotine or tobacco, such as cigarettes, e-cigarettes, and chewing tobacco. If you need help quitting, ask your health care provider.  Do not use street drugs.  Do not share needles.  Ask your health care provider for help if you need support or information about quitting drugs. Alcohol use  Do not drink alcohol if your health care provider tells you not to drink.  If you drink alcohol: ? Limit how much you have to 0-2 drinks a day. ? Be aware of how much alcohol is in your drink. In the [place] one drink equals one 12 oz bottle of beer (355 mL), one 5 oz glass of wine (148 mL), or one 1 oz glass of hard liquor (44 mL). General instructions  Schedule regular health, dental, and eye exams.  Stay current with your vaccines.  Tell your health care provider if: ? You often feel depressed. ? You have ever been abused or do not feel safe at home. Summary  Adopting a healthy lifestyle and getting preventive care are important in promoting health and wellness.  Follow your health care provider's instructions about healthy diet, exercising, and getting tested or screened for diseases.  Follow your health care provider's instructions on monitoring your cholesterol and blood pressure. This information is not intended to replace advice given to you by your health care provider. Make sure you discuss any questions you have with your health care provider. Document  Revised: 04/28/2018 Document Reviewed: 04/28/2018 Elsevier Patient Education  2020 [employer].

## 2019-12-14 ENCOUNTER — Other Ambulatory Visit: Payer: Self-pay

## 2019-12-14 ENCOUNTER — Encounter: Payer: Self-pay | Admitting: Internal Medicine

## 2019-12-14 ENCOUNTER — Ambulatory Visit (INDEPENDENT_AMBULATORY_CARE_PROVIDER_SITE_OTHER): Payer: PPO

## 2019-12-14 ENCOUNTER — Other Ambulatory Visit: Payer: Self-pay | Admitting: Internal Medicine

## 2019-12-14 ENCOUNTER — Ambulatory Visit (INDEPENDENT_AMBULATORY_CARE_PROVIDER_SITE_OTHER): Payer: PPO | Admitting: Internal Medicine

## 2019-12-14 VITALS — BP 130/84 | HR 67 | Temp 98.0°F | Ht 76.0 in | Wt 224.0 lb

## 2019-12-14 DIAGNOSIS — R7303 Prediabetes: Secondary | ICD-10-CM

## 2019-12-14 DIAGNOSIS — R079 Chest pain, unspecified: Secondary | ICD-10-CM | POA: Diagnosis not present

## 2019-12-14 DIAGNOSIS — I10 Essential (primary) hypertension: Secondary | ICD-10-CM | POA: Diagnosis not present

## 2019-12-14 DIAGNOSIS — I480 Paroxysmal atrial fibrillation: Secondary | ICD-10-CM | POA: Diagnosis not present

## 2019-12-14 DIAGNOSIS — Z0000 Encounter for general adult medical examination without abnormal findings: Secondary | ICD-10-CM | POA: Diagnosis not present

## 2019-12-14 DIAGNOSIS — E782 Mixed hyperlipidemia: Secondary | ICD-10-CM

## 2019-12-14 DIAGNOSIS — R0781 Pleurodynia: Secondary | ICD-10-CM | POA: Diagnosis not present

## 2019-12-14 DIAGNOSIS — R0789 Other chest pain: Secondary | ICD-10-CM

## 2019-12-14 DIAGNOSIS — S299XXA Unspecified injury of thorax, initial encounter: Secondary | ICD-10-CM | POA: Diagnosis not present

## 2019-12-14 NOTE — Assessment & Plan Note (Signed)
Chronic Lipids good Diet controlled Regular exercise and healthy diet encouraged

## 2019-12-14 NOTE — Assessment & Plan Note (Signed)
Chronic BP well controlled Current regimen effective and well tolerated Continue current medications at current doses  

## 2019-12-14 NOTE — Assessment & Plan Note (Signed)
Chronic Following w/ cardio On eliquis Asymptomatic Cbc, cmp normal

## 2019-12-15 ENCOUNTER — Encounter: Payer: Self-pay | Admitting: Internal Medicine

## 2020-01-09 ENCOUNTER — Ambulatory Visit: Payer: PPO | Admitting: Family Medicine

## 2020-01-09 ENCOUNTER — Other Ambulatory Visit: Payer: Self-pay

## 2020-01-09 ENCOUNTER — Ambulatory Visit (INDEPENDENT_AMBULATORY_CARE_PROVIDER_SITE_OTHER): Payer: PPO

## 2020-01-09 ENCOUNTER — Encounter: Payer: Self-pay | Admitting: Family Medicine

## 2020-01-09 ENCOUNTER — Ambulatory Visit: Payer: Self-pay

## 2020-01-09 VITALS — BP 148/80 | HR 60 | Ht 76.0 in | Wt 221.4 lb

## 2020-01-09 DIAGNOSIS — M25562 Pain in left knee: Secondary | ICD-10-CM

## 2020-01-09 DIAGNOSIS — M5136 Other intervertebral disc degeneration, lumbar region: Secondary | ICD-10-CM | POA: Diagnosis not present

## 2020-01-09 DIAGNOSIS — M79605 Pain in left leg: Secondary | ICD-10-CM | POA: Diagnosis not present

## 2020-01-09 DIAGNOSIS — M25762 Osteophyte, left knee: Secondary | ICD-10-CM | POA: Diagnosis not present

## 2020-01-09 MED ORDER — GABAPENTIN 300 MG PO CAPS: 300.0000 mg | ORAL_CAPSULE | Freq: Three times a day (TID) | ORAL | 3 refills | Status: DC | PRN

## 2020-01-09 MED ORDER — PREDNISONE 50 MG PO TABS: 50.0000 mg | ORAL_TABLET | Freq: Every day | ORAL | 0 refills | Status: DC

## 2020-01-09 NOTE — Progress Notes (Signed)
X-ray lumbar spine shows arthritis and disc narrowing potentially pinching nerves throughout the lumbar spine from L3-4 to L4-5 L5-S1.  If needed MRI will be very helpful to further characterize this.

## 2020-01-09 NOTE — Progress Notes (Signed)
X-ray left knee shows a small bone spur at the kneecap otherwise looks pretty normal to radiology.

## 2020-01-09 NOTE — Patient Instructions (Addendum)
Thank you for coming in today. Plan for prednisone daily for 5 days and gabapentin as needed for nerve pain.  Plan for xray today and physical therapy.  If not improving please let me know.   There is more to do if needed.

## 2020-01-09 NOTE — Progress Notes (Signed)
I, Wendy Poet, LAT, ATC, am serving as scribe for Dr. Lynne Leader.  [name] [name] is a 69 y.o. male who presents to [facility] today for L knee and leg pain.  He was last seen by Dr. Tamala Julian on 08/25/19 for his R hip.  Since then, pt reports L knee and L leg pain since late May 2021 when he was carrying coolers and suitcases upstairs.  He was initially having pain in his L knee and post thigh but has also had pain running down his entire L leg.  Radiating pain: yes into entire L leg at times Low back pain: No L knee mechanical symptoms: No L knee swelling: No Aggravating factors: L knee flexion; sit-to-stand transitions Treatments tried: rest; Tylenol;    Pertinent review of systems: No fevers or chills  Relevant historical information: A. fib.   Exam:  BP (!) 148/80 (BP Location: Right Arm, Patient Position: Sitting, Cuff Size: Large)   Pulse 60   Ht 6\' 4"  (1.93 m)   Wt 221 lb 6.4 oz (100.4 kg)   SpO2 98%   BMI 26.95 kg/m  General: Well Developed, well nourished, and in no acute distress.   MSK: L-spine normal-appearing Decreased motion flexion extension and rotation. Lower extremity reflexes and sensation equal and intact bilaterally. Mildly positive left-sided slump test. Left knee normal-appearing without significant effusion. Normal motion with crepitation. Not particular tender palpation. Stable ligamentous exam.   Lab and Radiology Results  X-ray images obtained today L-spine and left knee personally and independently reviewed  L-spine: DDD worst at L5-S1.  No fractures.  Left knee: Moderate patellofemoral DJD.  Mild medial lateral DJD.  Osteophyte superior patella present.  No fracture  Await formal radiology review    Assessment and Plan: 69 y.o. male with left leg pain.  Likely multifactorial.  Suspect component of lumbar radiculopathy and knee DJD related pain.  Could also be isolated hamstring strain as well this is  less likely.  Plan for short course of prednisone and gabapentin and physical therapy.  If not improving patient will notify me return to clinic likely proceed with ultrasound and possibly injection of the knee.   PDMP not reviewed this encounter. Orders Placed This Encounter  Procedures  . DG Knee AP/LAT W/Sunrise Left    Standing Status:   Future    Number of Occurrences:   1    Standing Expiration Date:   01/08/2021    Order Specific Question:   Reason for Exam (SYMPTOM  OR DIAGNOSIS REQUIRED)    Answer:   eval left knee pain    Order Specific Question:   Preferred imaging location?    Answer:   [facility]    Order Specific Question:   Radiology Contrast Protocol - do NOT remove file path    Answer:   [email or web address]  . DG Lumbar Spine 2-3 Views    Standing Status:   Future    Number of Occurrences:   1    Standing Expiration Date:   01/08/2021    Order Specific Question:   Reason for Exam (SYMPTOM  OR DIAGNOSIS REQUIRED)    Answer:   eval possible left sciatica    Order Specific Question:   Preferred imaging location?    Answer:   [facility]    Order Specific Question:   Radiology Contrast Protocol - do NOT remove file path    Answer:   [email or web address]  . Ambulatory referral to  Physical Therapy    Referral Priority:   Routine    Referral Type:   Physical Medicine    Referral Reason:   Specialty Services Required    Requested Specialty:   Physical Therapy   Meds ordered this encounter  Medications  . gabapentin (NEURONTIN) 300 MG capsule    Sig: Take 1 capsule (300 mg total) by mouth 3 (three) times daily as needed (nerve pain).    Dispense:  90 capsule    Refill:  3  . predniSONE (DELTASONE) 50 MG tablet    Sig: Take 1 tablet (50 mg total) by mouth daily.    Dispense:  5 tablet    Refill:  0     Discussed warning signs or symptoms. Please see discharge instructions.  Patient expresses understanding.   The above documentation has been reviewed and is accurate and complete Lynne Leader, M.D.

## 2020-01-26 ENCOUNTER — Encounter: Payer: Self-pay | Admitting: Internal Medicine

## 2020-02-05 ENCOUNTER — Other Ambulatory Visit: Payer: Self-pay | Admitting: Cardiovascular Disease

## 2020-02-16 ENCOUNTER — Encounter: Payer: Self-pay | Admitting: Internal Medicine

## 2020-02-20 ENCOUNTER — Other Ambulatory Visit: Payer: Self-pay

## 2020-02-20 ENCOUNTER — Encounter: Payer: Self-pay | Admitting: Physical Therapy

## 2020-02-20 ENCOUNTER — Ambulatory Visit: Payer: PPO | Attending: Family Medicine | Admitting: Physical Therapy

## 2020-02-20 DIAGNOSIS — M6281 Muscle weakness (generalized): Secondary | ICD-10-CM | POA: Diagnosis not present

## 2020-02-20 DIAGNOSIS — M25562 Pain in left knee: Secondary | ICD-10-CM | POA: Diagnosis not present

## 2020-02-20 DIAGNOSIS — R262 Difficulty in walking, not elsewhere classified: Secondary | ICD-10-CM | POA: Insufficient documentation

## 2020-02-20 DIAGNOSIS — M25551 Pain in right hip: Secondary | ICD-10-CM | POA: Insufficient documentation

## 2020-02-20 NOTE — Therapy (Signed)
[address]. [place], [place], [address] Phone: [phone]   Fax:  [phone]  Physical Therapy Evaluation  Patient Details  Name: [name] [name] MRN: [id number] Date of Birth: [birth year] Referring Provider (PT): Marikay Alar Date: 02/20/2020   PT End of Session - 02/20/20 1625    Visit Number 1    Date for PT Re-Evaluation 04/21/20    PT Start Time 1444    PT Stop Time 1526    PT Time Calculation (min) 42 min    Activity Tolerance Patient tolerated treatment well    Behavior During Therapy [facility] for tasks assessed/performed           Past Medical History:  Diagnosis Date  . Atrial fibrillation with RVR (HCC)    Converted to NSR with lopressor  . Benign prostatic hypertrophy with elevated PSA    Dr Alinda Money, High grade PIN w/o malignancy  . History of colonic polyps 2003, 2007   hyperplastic  . Hx of colonoscopy 2012   neg  . Hyperlipidemia   . Hypertension   . Retinal vascular occlusion    Dr Delman Cheadle    Past Surgical History:  Procedure Laterality Date  . APPENDECTOMY    . COLONOSCOPY W/ POLYPECTOMY      X2 , Dr Deatra Ina  . ENDOVENOUS ABLATION SAPHENOUS VEIN W/ LASER Left 09/29/2017   endovenous laser ablation left greater saphenous vein by Tinnie Gens MD   . HERNIA REPAIR  [phone]    Dr Margot Chimes  . lichenoid keratosis resected  8/14  . LIPOMA EXCISION     X 5  . PROSTATE BIOPSY  09/2010   Dr Alinda Money  . stab phlebectomy  Left 01/27/2018   stab phlebectomy 10-20 incisions left leg by Ruta Hinds MD    There were no vitals filed for this visit.    Subjective Assessment - 02/20/20 1444    Subjective Pt reports that L knee began hurting at end of May 2021 while carrying things across the yard. Pt reports that knee gradually got better until he hit foot against something and then started knee pain over again. Pt reports pain across front of knee. States no problems with walking but is having  difficulty bending to pick things off the ground.    Limitations Standing;Walking;House hold activities    Diagnostic tests xrays of knee and LB    Patient Stated Goals reduce pain    Currently in Pain? Yes    Pain Score 0-No pain    Pain Location Knee    Pain Orientation Left    Pain Descriptors / Indicators Shooting    Pain Type Chronic pain    Pain Radiating Towards reports initially was radiating down LE but now is localized to the knee    Pain Onset More than a month ago    Pain Frequency Intermittent    Aggravating Factors  prolonged standing, rising from chair    Pain Relieving Factors rest, ice, gabapentin (not taking anymore)              [facility] PT Assessment - 02/20/20 0001      Assessment   Medical Diagnosis L knee pain    Referring Provider (PT) Georgina Snell      Precautions   Precautions None      Restrictions   Weight Bearing Restrictions No      Balance Screen   Has the patient fallen in the past 6 months No  Has the patient had a decrease in activity level because of a fear of falling?  No    Is the patient reluctant to leave their home because of a fear of falling?  No      Home Environment   Additional Comments stairs inside home and stairs to enter house      Prior Function   Level of Independence Independent    Vocation Retired    Nature conservation officer   Posture/Postural Control Postural limitations    Postural Limitations Rounded Shoulders;Forward head;Decreased lumbar lordosis      ROM / Strength   AROM / PROM / Strength AROM;Strength      AROM   Overall AROM Comments knee extension limited by hamstring tightness; lumbar AROM 25% limited      Strength   Overall Strength Comments BLE strength 5/5 except L hip ext/abd 4-/5      Flexibility   Soft Tissue Assessment /Muscle Length yes    Hamstrings very tight and painful    Quadriceps tight      Palpation   Palpation comment tender L joint line, mild tenderness in L  medial hamstring      Transfers   Five time sit to stand comments  fatigues quickly difficulty from low surfaces      Ambulation/Gait   Gait Comments noted knee valgus during gatit and with STS                      Objective measurements completed on examination: See above findings.       [facility] Adult PT Treatment/Exercise - 02/20/20 0001      Exercises   Exercises Knee/Hip      Knee/Hip Exercises: Stretches   Active Hamstring Stretch Both;1 rep;30 seconds    Active Hamstring Stretch Limitations long sitting      Knee/Hip Exercises: Standing   Other Standing Knee Exercises standing hip abd/ext with red TB      Knee/Hip Exercises: Seated   Sit to Sand 10 reps;without UE support                  PT Education - 02/20/20 1625    Education Details Pt educated on POC and HEP    Person(s) Educated Patient    Methods Explanation;Demonstration;Handout    Comprehension Verbalized understanding;Returned demonstration            PT Short Term Goals - 02/20/20 1649      PT SHORT TERM GOAL #1   Title Pt will be independent with HEP    Time 2    Period Weeks    Status New    Target Date 03/05/20             PT Long Term Goals - 02/20/20 1649      PT LONG TERM GOAL #1   Title Pt will report ability to play full round of golf with no increase in L knee pain    Time 6    Period Weeks    Status New    Target Date 04/02/20      PT LONG TERM GOAL #2   Title Pt will demo 5x STS <15 sec    Baseline 22 sec    Time 6    Period Weeks    Status New    Target Date 04/02/20      PT LONG TERM GOAL #3   Title Pt will demo  able to ascend/descend 1 flight of 6" stairs step over step with no increase in L knee pain    Time 6    Period Weeks    Status New    Target Date 04/02/20                  Plan - 02/20/20 1626    Clinical Impression Statement Pt presents to clinic with reports of L knee pain intermittently over the past few months. Pt  states that he had some joint swelling and moderate pain initially which has gradually gotten better. Still having difficulty with rising from low surfaces, prolonged standing/walking, and stairs. Pt xrays clear of major findings. Pt demos hip ext/abd weakness L>R, fatigue with 5xSTS, functional LE weakness, decreased LE flexibility. Pt also reports some mild LBP and demos some postural weakness. Pt would like to return to playing golf and remain active with his wife. Pt would benefit from skilled PT to address the above impairments.    Examination-Activity Limitations Stairs;Stand;Transfers    Examination-Participation Restrictions Community Activity;Interpersonal Relationship    Stability/Clinical Decision Making Stable/Uncomplicated    Clinical Decision Making Low    Rehab Potential Good    PT Frequency 2x / week    PT Duration 6 weeks    PT Treatment/Interventions ADLs/Self Care Home Management;Cryotherapy;Iontophoresis 4mg /ml Dexamethasone;Moist Heat;Gait training;Stair training;Functional mobility training;Therapeutic activities;Therapeutic exercise;Neuromuscular re-education;Manual techniques;Patient/family education;Passive range of motion;Vasopneumatic Device    PT Next Visit Plan LE flexibility/strengthening, postural ex's, update/progress HEP    PT Home Exercise Plan STS, hamstring stretch, hip abd/ext with red TB    Consulted and Agree with Plan of Care Patient           Patient will benefit from skilled therapeutic intervention in order to improve the following deficits and impairments:  Abnormal gait, Difficulty walking, Pain, Impaired flexibility, Postural dysfunction  Visit Diagnosis: Acute pain of left knee  Muscle weakness (generalized)  Difficulty in walking, not elsewhere classified     Problem List Patient Active Problem List   Diagnosis Date Noted  . Ingrown toenail of right foot 03/23/2019  . Current use of long term anticoagulation 01/20/2018  . EtOH  dependence ([place]) 07/30/2017  . Paroxysmal atrial fibrillation ([place]) 07/29/2017  . Varicose veins of left lower extremity with complications 60/73/7106  . Greater trochanteric bursitis of right hip 01/02/2017  . Prediabetes 09/20/2016  . Rotator cuff syndrome of right shoulder 09/27/2015  . Vitamin D deficiency 09/07/2014  . Lichenoid keratosis 26/94/8546  . Superficial thrombosis of leg 03/28/2013  . 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 Inguinal hernia recurrent unilateral 08/10/2009  . History of colonic polyps 05/15/2008  . Essential hypertension 05/15/2008  . HYPERLIPIDEMIA 05/12/2007  . Elevated prostate specific antigen (PSA) 05/12/2007   Amador Cunas, PT, DPT Donald Prose Kingslee Mairena 02/20/2020, 4:58 PM  [address]. [place], [place], [address] Phone: [phone]   Fax:  [phone]  Name: Massey RAYDELL MANERS MRN: [id number] Date of Birth: 1950/11/22

## 2020-02-20 NOTE — Patient Instructions (Signed)
Access Code: [email or web address] URL: [email or web address] Date: 02/20/2020 Prepared by: Amador Cunas  Exercises Sit to Stand without Arm Support - 1 x daily - 7 x weekly - 3 sets - 10 reps Seated Table Hamstring Stretch - 1 x daily - 7 x weekly - 3 sets - 2 reps - 20-30 sec hold Hip Abduction with Resistance Loop - 1 x daily - 7 x weekly - 3 sets - 10 reps Standing Hip Extension Kicks - 1 x daily - 7 x weekly - 3 sets - 10 reps

## 2020-03-08 ENCOUNTER — Encounter: Payer: Self-pay | Admitting: Physical Therapy

## 2020-03-08 ENCOUNTER — Other Ambulatory Visit: Payer: Self-pay

## 2020-03-08 ENCOUNTER — Ambulatory Visit: Payer: PPO | Admitting: Physical Therapy

## 2020-03-08 DIAGNOSIS — R262 Difficulty in walking, not elsewhere classified: Secondary | ICD-10-CM

## 2020-03-08 DIAGNOSIS — M25562 Pain in left knee: Secondary | ICD-10-CM

## 2020-03-08 DIAGNOSIS — M6281 Muscle weakness (generalized): Secondary | ICD-10-CM

## 2020-03-08 NOTE — Therapy (Signed)
[address]. [place], [place], [address] Phone: [phone]   Fax:  [phone]  Physical Therapy Treatment  Patient Details  Name: [name] [name] MRN: [id number] Date of Birth: November 10, 1950 Referring Provider (PT): Marikay Alar Date: 03/08/2020   PT End of Session - 03/08/20 1138    Visit Number 2    Date for PT Re-Evaluation 04/21/20    PT Start Time 1058    PT Stop Time 1140    PT Time Calculation (min) 42 min    Activity Tolerance Patient tolerated treatment well    Behavior During Therapy [facility] for tasks assessed/performed           Past Medical History:  Diagnosis Date  . Atrial fibrillation with RVR (HCC)    Converted to NSR with lopressor  . Benign prostatic hypertrophy with elevated PSA    Dr Alinda Money, High grade PIN w/o malignancy  . History of colonic polyps 2003, 2007   hyperplastic  . Hx of colonoscopy 2012   neg  . Hyperlipidemia   . Hypertension   . Retinal vascular occlusion    Dr Delman Cheadle    Past Surgical History:  Procedure Laterality Date  . APPENDECTOMY    . COLONOSCOPY W/ POLYPECTOMY      X2 , Dr Deatra Ina  . ENDOVENOUS ABLATION SAPHENOUS VEIN W/ LASER Left 09/29/2017   endovenous laser ablation left greater saphenous vein by Tinnie Gens MD   . HERNIA REPAIR  [phone]    Dr Margot Chimes  . lichenoid keratosis resected  8/14  . LIPOMA EXCISION     X 5  . PROSTATE BIOPSY  09/2010   Dr Alinda Money  . stab phlebectomy  Left 01/27/2018   stab phlebectomy 10-20 incisions left leg by Ruta Hinds MD    There were no vitals filed for this visit.   Subjective Assessment - 03/08/20 1058    Subjective Knee is feeling good, not stopping him form doing anything.    Currently in Pain? Yes    Pain Score 4     Pain Location Hip    Pain Orientation Right                             [facility] Adult PT Treatment/Exercise - 03/08/20 0001      Knee/Hip Exercises: Stretches    Active Hamstring Stretch Left;5 reps;10 seconds      Knee/Hip Exercises: Aerobic   Recumbent Bike L2 x 4 min     Nustep L5 LE only x5       Knee/Hip Exercises: Machines for Strengthening   Cybex Knee Extension 10lb 2x10     Cybex Knee Flexion 25lb 2x10     Cybex Leg Press 30lb 3x10      Knee/Hip Exercises: Standing   Heel Raises Both;2 sets;10 reps;2 seconds    Forward Step Up Both;1 set;10 reps;Hand Hold: 0;Step Height: 6"    Other Standing Knee Exercises Resisted side step 30lb x 5 each                     PT Short Term Goals - 03/08/20 1143      PT SHORT TERM GOAL #1   Title Pt will be independent with HEP    Status Achieved             PT Long Term Goals - 03/08/20 1143  PT LONG TERM GOAL #1   Title Pt will report ability to play full round of golf with no increase in L knee pain    Status On-going                 Plan - 03/08/20 1139    Clinical Impression Statement Pt tolerated an initial progression toe TE well. She did reports a little L HS discomfort that's he rated 1/10 with HS curls. L HS is tight noted with passive stretching. He stated that his knee is 95% better but R hip has been causing trouble. Stated has R hip bursitis before in the past.    Examination-Activity Limitations Stairs;Stand;Transfers    Examination-Participation Restrictions Community Activity;Interpersonal Relationship    Stability/Clinical Decision Making Stable/Uncomplicated    Rehab Potential Good    PT Frequency 2x / week    PT Duration 6 weeks    PT Treatment/Interventions ADLs/Self Care Home Management;Cryotherapy;Iontophoresis 4mg /ml Dexamethasone;Moist Heat;Gait training;Stair training;Functional mobility training;Therapeutic activities;Therapeutic exercise;Neuromuscular re-education;Manual techniques;Patient/family education;Passive range of motion;Vasopneumatic Device    PT Next Visit Plan LE flexibility/strengthening, postural ex's, update gym HEP            Patient will benefit from skilled therapeutic intervention in order to improve the following deficits and impairments:  Abnormal gait, Difficulty walking, Pain, Impaired flexibility, Postural dysfunction  Visit Diagnosis: Acute pain of left knee  Muscle weakness (generalized)  Difficulty in walking, not elsewhere classified     Problem List Patient Active Problem List   Diagnosis Date Noted  . Ingrown toenail of right foot 03/23/2019  . Current use of long term anticoagulation 01/20/2018  . EtOH dependence ([place]) 07/30/2017  . Paroxysmal atrial fibrillation ([place]) 07/29/2017  . Varicose veins of left lower extremity with complications 50/53/9767  . Greater trochanteric bursitis of right hip 01/02/2017  . Prediabetes 09/20/2016  . Rotator cuff syndrome of right shoulder 09/27/2015  . Vitamin D deficiency 09/07/2014  . Lichenoid keratosis 34/19/3790  . Superficial thrombosis of leg 03/28/2013  . 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 Inguinal hernia recurrent unilateral 08/10/2009  . History of colonic polyps 05/15/2008  . Essential hypertension 05/15/2008  . HYPERLIPIDEMIA 05/12/2007  . Elevated prostate specific antigen (PSA) 05/12/2007    Scot Jun 03/08/2020, 11:45 AM  [address]. [place], [place], [address] Phone: [phone]   Fax:  [phone]  Name: Viraaj JAHKAI YANDELL MRN: [id number] Date of Birth: [birth year]

## 2020-03-12 ENCOUNTER — Ambulatory Visit: Payer: Self-pay

## 2020-03-12 ENCOUNTER — Ambulatory Visit: Payer: PPO | Admitting: Family Medicine

## 2020-03-12 ENCOUNTER — Encounter: Payer: Self-pay | Admitting: Family Medicine

## 2020-03-12 ENCOUNTER — Other Ambulatory Visit: Payer: Self-pay

## 2020-03-12 VITALS — BP 120/76 | HR 59 | Ht 76.0 in | Wt 214.2 lb

## 2020-03-12 DIAGNOSIS — M25551 Pain in right hip: Secondary | ICD-10-CM | POA: Diagnosis not present

## 2020-03-12 DIAGNOSIS — G8929 Other chronic pain: Secondary | ICD-10-CM | POA: Diagnosis not present

## 2020-03-12 NOTE — Progress Notes (Signed)
    [name] [name] is a 69 y.o. male who presents to [place] [facility] today for R hip pain.  He was last seen by Dr. Georgina Snell on 01/09/20 for his L knee and was prescribed Gabapentin and a short course of prednisone.  He was also referred for PT of which he completed 2 sessions.  He was previously seen by Dr. Tamala Julian on 08/25/19 for his R hip and had a R hip GT injection.  Since his last visit, pt reports his R hip has been flared up for 2-4 weeks but has had issues w/ it in the past.  He states that his L knee is 95% improved and would prefer to con't PT but focus on his R hip instead of his L knee.  Aggravating factors including walking and weight bearing activity and L sidelying at night.  Diagnostic testing: L-spine and L knee XR- 01/09/20; R hip XR- 01/02/17   Pertinent review of systems: No fever or chills  Relevant historical information: Afib on Eliquis   Exam:  BP 120/76 (BP Location: Left Arm, Patient Position: Sitting, Cuff Size: Large)   Pulse (!) 59   Ht 6\' 4"  (1.93 m)   Wt 214 lb 3.2 oz (97.2 kg)   SpO2 98%   BMI 26.07 kg/m  General: Well Developed, well nourished, and in no acute distress.   MSK: Right hip normal-appearing normal motion. Mildly tender palpation greater trochanter. Hip abduction strength diminished 4/5.  External rotation strength diminished 4/5. Normal gait.    Lab and Radiology Results X-ray images right hip dated 2018 personally independently reviewed today. Minimal hip DJD greater trochanter normal-appearing     Assessment and Plan: 69 y.o. male with right lateral hip pain due to trochanteric bursitis/hip abductor tendinopathy.  Previously seen and treated for left knee pain.  Plan to switch existing physical therapy to focus more on the hip.  Discussed possibility of injection today.  Both the patient and I both agreed to delay injection as I do think he is going to benefit significantly from physical therapy.  He may benefit  from iontophoresis and physical therapy.  Recheck back with me in about 6 weeks especially if not improved.   PDMP not reviewed this encounter. Orders Placed This Encounter  Procedures  . Ambulatory referral to Physical Therapy    Referral Priority:   Routine    Referral Type:   Physical Medicine    Referral Reason:   Specialty Services Required    Requested Specialty:   Physical Therapy    Number of Visits Requested:   1   No orders of the defined types were placed in this encounter.    Discussed warning signs or symptoms. Please see discharge instructions. Patient expresses understanding.   The above documentation has been reviewed and is accurate and complete Lynne Leader, M.D.

## 2020-03-12 NOTE — Patient Instructions (Signed)
Thank you for coming in today.  Plan for PT.  Ok to continue voltaren gel.  If not improving we can do a shot.  If not better I will update xray and consider MRI.    Recheck as needed.    Hip Bursitis Rehab Ask your health care provider which exercises are safe for you. Do exercises exactly as told by your health care provider and adjust them as directed. It is normal to feel mild stretching, pulling, tightness, or discomfort as you do these exercises. Stop right away if you feel sudden pain or your pain gets worse. Do not begin these exercises until told by your health care provider. Stretching exercise This exercise warms up your muscles and joints and improves the movement and flexibility of your hip. This exercise also helps to relieve pain and stiffness. Iliotibial band stretch An iliotibial band is a strong band of muscle tissue that runs from the outer side of your hip to the outer side of your thigh and knee. 1. Lie on your side with your left / right leg in the top position. 2. Bend your left / right knee and grab your ankle. Stretch out your bottom arm to help you balance. 3. Slowly bring your knee back so your thigh is behind your body. 4. Slowly lower your knee toward the floor until you feel a gentle stretch on the outside of your left / right thigh. If you do not feel a stretch and your knee will not fall farther, place the heel of your other foot on top of your knee and pull your knee down toward the floor with your foot. 5. Hold this position for __________ seconds. 6. Slowly return to the starting position. Repeat __________ times. Complete this exercise __________ times a day. Strengthening exercises These exercises build strength and endurance in your hip and pelvis. Endurance is the ability to use your muscles for a long time, even after they get tired. Bridge This exercise strengthens the muscles that move your thigh backward (hip extensors). 1. Lie on your back on a  firm surface with your knees bent and your feet flat on the floor. 2. Tighten your buttocks muscles and lift your buttocks off the floor until your trunk is level with your thighs. ? Do not arch your back. ? You should feel the muscles working in your buttocks and the back of your thighs. If you do not feel these muscles, slide your feet 1-2 inches (2.5-5 cm) farther away from your buttocks. ? If this exercise is too easy, try doing it with your arms crossed over your chest. 3. Hold this position for __________ seconds. 4. Slowly lower your hips to the starting position. 5. Let your muscles relax completely after each repetition. Repeat __________ times. Complete this exercise __________ times a day. Squats This exercise strengthens the muscles in front of your thigh and knee (quadriceps). 1. Stand in front of a table, with your feet and knees pointing straight ahead. You may rest your hands on the table for balance but not for support. 2. Slowly bend your knees and lower your hips like you are going to sit in a chair. ? Keep your weight over your heels, not over your toes. ? Keep your lower legs upright so they are parallel with the table legs. ? Do not let your hips go lower than your knees. ? Do not bend lower than told by your health care provider. ? If your hip pain increases, do not  bend as low. 3. Hold the squat position for __________ seconds. 4. Slowly push with your legs to return to standing. Do not use your hands to pull yourself to standing. Repeat __________ times. Complete this exercise __________ times a day. Hip hike 1. Stand sideways on a bottom step. Stand on your left / right leg with your other foot unsupported next to the step. You can hold on to the railing or wall for balance if needed. 2. Keep your knees straight and your torso square. Then lift your left / right hip up toward the ceiling. 3. Hold this position for __________ seconds. 4. Slowly let your left / right  hip lower toward the floor, past the starting position. Your foot should get closer to the floor. Do not lean or bend your knees. Repeat __________ times. Complete this exercise __________ times a day. Single leg stand 1. Without shoes, stand near a railing or in a doorway. You may hold on to the railing or door frame as needed for balance. 2. Squeeze your left / right buttock muscles, then lift up your other foot. ? Do not let your left / right hip push out to the side. ? It is helpful to stand in front of a mirror for this exercise so you can watch your hip. 3. Hold this position for __________ seconds. Repeat __________ times. Complete this exercise __________ times a day. This information is not intended to replace advice given to you by your health care provider. Make sure you discuss any questions you have with your health care provider. Document Revised: 08/30/2018 Document Reviewed: 08/30/2018 Elsevier Patient Education  [place].

## 2020-03-13 ENCOUNTER — Ambulatory Visit: Payer: PPO | Admitting: Physical Therapy

## 2020-03-13 ENCOUNTER — Other Ambulatory Visit: Payer: Self-pay

## 2020-03-13 ENCOUNTER — Encounter: Payer: Self-pay | Admitting: Physical Therapy

## 2020-03-13 DIAGNOSIS — M25562 Pain in left knee: Secondary | ICD-10-CM | POA: Diagnosis not present

## 2020-03-13 DIAGNOSIS — R262 Difficulty in walking, not elsewhere classified: Secondary | ICD-10-CM

## 2020-03-13 DIAGNOSIS — M6281 Muscle weakness (generalized): Secondary | ICD-10-CM

## 2020-03-13 NOTE — Therapy (Signed)
[place]. [place], [place], [address] Phone: [phone]   Fax:  [phone]  Physical Therapy Treatment  Patient Details  Name: [name] [name] MRN: [id number] Date of Birth: 1950/06/19 Referring Provider (PT): Marikay Alar Date: 03/13/2020   PT End of Session - 03/13/20 1506    Visit Number 3    Date for PT Re-Evaluation 04/21/20    PT Start Time 1430    PT Stop Time 1503    PT Time Calculation (min) 33 min    Activity Tolerance Patient tolerated treatment well    Behavior During Therapy [facility] for tasks assessed/performed           Past Medical History:  Diagnosis Date  . Atrial fibrillation with RVR (HCC)    Converted to NSR with lopressor  . Benign prostatic hypertrophy with elevated PSA    Dr Alinda Money, High grade PIN w/o malignancy  . History of colonic polyps 2003, 2007   hyperplastic  . Hx of colonoscopy 2012   neg  . Hyperlipidemia   . Hypertension   . Retinal vascular occlusion    Dr Delman Cheadle    Past Surgical History:  Procedure Laterality Date  . APPENDECTOMY    . COLONOSCOPY W/ POLYPECTOMY      X2 , Dr Deatra Ina  . ENDOVENOUS ABLATION SAPHENOUS VEIN W/ LASER Left 09/29/2017   endovenous laser ablation left greater saphenous vein by Tinnie Gens MD   . HERNIA REPAIR  [phone]    Dr Margot Chimes  . lichenoid keratosis resected  8/14  . LIPOMA EXCISION     X 5  . PROSTATE BIOPSY  09/2010   Dr Alinda Money  . stab phlebectomy  Left 01/27/2018   stab phlebectomy 10-20 incisions left leg by Ruta Hinds MD    There were no vitals filed for this visit.   Subjective Assessment - 03/13/20 1430    Subjective Pain has moved from the L knee to R hip.    Currently in Pain? Yes    Pain Score 2    Shooting pain at times 10/10 jolt .5 seconds   Pain Location Hip    Pain Orientation Right                             [facility] Adult PT Treatment/Exercise - 03/13/20 0001      High Level  Balance   High Level Balance Comments SLS 10 sec x 3 each side       Knee/Hip Exercises: Stretches   ITB Stretch Right;5 reps;10 seconds;20 seconds      Knee/Hip Exercises: Standing   Functional Squat 2 sets;10 reps;3 seconds    Other Standing Knee Exercises hip hike x 10 each       Knee/Hip Exercises: Supine   Bridges Strengthening;10 reps;2 sets      Modalities   Modalities Iontophoresis      Iontophoresis   Type of Iontophoresis Dexamethasone    Location R GT    Dose 82ml    Time 4 hour patch                    PT Short Term Goals - 03/08/20 1143      PT SHORT TERM GOAL #1   Title Pt will be independent with HEP    Status Achieved             PT Long Term  Goals - 03/08/20 1143      PT LONG TERM GOAL #1   Title Pt will report ability to play full round of golf with no increase in L knee pain    Status On-going                 Plan - 03/13/20 1513    Clinical Impression Statement Pt enters clinic with HEP that he received from MD. Pt stated that he wanted to go over HEP. Pt was instructed on the proper execution of each interventions.    Examination-Activity Limitations Stairs;Stand;Transfers    Examination-Participation Restrictions Community Activity;Interpersonal Relationship    Stability/Clinical Decision Making Stable/Uncomplicated    Rehab Potential Good    PT Frequency 2x / week    PT Duration 6 weeks    PT Treatment/Interventions ADLs/Self Care Home Management;Cryotherapy;Iontophoresis 4mg /ml Dexamethasone;Moist Heat;Gait training;Stair training;Functional mobility training;Therapeutic activities;Therapeutic exercise;Neuromuscular re-education;Manual techniques;Patient/family education;Passive range of motion;Vasopneumatic Device    PT Next Visit Plan R hip eval           Patient will benefit from skilled therapeutic intervention in order to improve the following deficits and impairments:  Abnormal gait, Difficulty walking, Pain,  Impaired flexibility, Postural dysfunction  Visit Diagnosis: Difficulty in walking, not elsewhere classified  Muscle weakness (generalized)     Problem List Patient Active Problem List   Diagnosis Date Noted  . Ingrown toenail of right foot 03/23/2019  . Current use of long term anticoagulation 01/20/2018  . EtOH dependence ([place]) 07/30/2017  . Paroxysmal atrial fibrillation ([place]) 07/29/2017  . Varicose veins of left lower extremity with complications 58/52/7782  . Greater trochanteric bursitis of right hip 01/02/2017  . Prediabetes 09/20/2016  . Rotator cuff syndrome of right shoulder 09/27/2015  . Vitamin D deficiency 09/07/2014  . Lichenoid keratosis 42/35/3614  . Superficial thrombosis of leg 03/28/2013  . 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 Inguinal hernia recurrent unilateral 08/10/2009  . History of colonic polyps 05/15/2008  . Essential hypertension 05/15/2008  . HYPERLIPIDEMIA 05/12/2007  . Elevated prostate specific antigen (PSA) 05/12/2007    Scot Jun 03/13/2020, 3:16 PM  [place]. [place], [place], [address] Phone: [phone]   Fax:  [phone]  Name: [name] [name] MRN: [id number] Date of Birth: 1950-12-25

## 2020-03-15 ENCOUNTER — Encounter: Payer: Self-pay | Admitting: Physical Therapy

## 2020-03-15 ENCOUNTER — Other Ambulatory Visit: Payer: Self-pay

## 2020-03-15 ENCOUNTER — Ambulatory Visit: Payer: PPO | Admitting: Physical Therapy

## 2020-03-15 DIAGNOSIS — R262 Difficulty in walking, not elsewhere classified: Secondary | ICD-10-CM

## 2020-03-15 DIAGNOSIS — M25562 Pain in left knee: Secondary | ICD-10-CM | POA: Diagnosis not present

## 2020-03-15 DIAGNOSIS — M25551 Pain in right hip: Secondary | ICD-10-CM

## 2020-03-15 NOTE — Therapy (Signed)
[place]. [place], [place], [address] Phone: [phone]   Fax:  [phone]  Physical Therapy Evaluation  Patient Details  Name: [name] [name] MRN: [id number] Date of Birth: 1950/12/10 Referring Provider (PT): Marikay Alar Date: 03/15/2020   PT End of Session - 03/15/20 1339    Visit Number 4    Date for PT Re-Evaluation 04/21/20    PT Start Time 8938    PT Stop Time 1352    PT Time Calculation (min) 46 min    Activity Tolerance Patient tolerated treatment well    Behavior During Therapy [facility] for tasks assessed/performed           Past Medical History:  Diagnosis Date  . Atrial fibrillation with RVR (HCC)    Converted to NSR with lopressor  . Benign prostatic hypertrophy with elevated PSA    Dr Alinda Money, High grade PIN w/o malignancy  . History of colonic polyps 2003, 2007   hyperplastic  . Hx of colonoscopy 2012   neg  . Hyperlipidemia   . Hypertension   . Retinal vascular occlusion    Dr Delman Cheadle    Past Surgical History:  Procedure Laterality Date  . APPENDECTOMY    . COLONOSCOPY W/ POLYPECTOMY      X2 , Dr Deatra Ina  . ENDOVENOUS ABLATION SAPHENOUS VEIN W/ LASER Left 09/29/2017   endovenous laser ablation left greater saphenous vein by Tinnie Gens MD   . HERNIA REPAIR  [phone]    Dr Margot Chimes  . lichenoid keratosis resected  8/14  . LIPOMA EXCISION     X 5  . PROSTATE BIOPSY  09/2010   Dr Alinda Money  . stab phlebectomy  Left 01/27/2018   stab phlebectomy 10-20 incisions left leg by Ruta Hinds MD    There were no vitals filed for this visit.    Subjective Assessment - 03/15/20 1309    Subjective Patient reports that he has had right hip pain for a number of years, has had cortisone injections that help, MD feels it is bursitis, he is unsure of a cause, was being seen her for the left knee pain    Currently in Pain? Yes    Pain Score 2     Pain Location Hip    Pain Orientation Right     Pain Descriptors / Indicators Sharp;Aching    Pain Type Acute pain    Aggravating Factors  movements, really not sure    Pain Relieving Factors cortisone injection    Effect of Pain on Daily Activities fear of stairs and ladders due to the pain              [facility] PT Assessment - 03/15/20 0001      Assessment   Medical Diagnosis right hip pain    Referring Provider (PT) Georgina Snell    Onset Date/Surgical Date 03/08/20    Prior Therapy for the left knee      Prior Function   Leisure golf regularly, gym at times      Strength   Overall Strength Comments right hip abduction and ER 4-/5      Flexibility   Soft Tissue Assessment /Muscle Length yes    Hamstrings very tight 40 degree SLR    Quadriceps tight    ITB very tight    Piriformis very tight      Palpation   Palpation comment tender right GT area  Objective measurements completed on examination: See above findings.       [facility] Adult PT Treatment/Exercise - 03/15/20 0001      Iontophoresis   Type of Iontophoresis Dexamethasone    Location R GT    Dose 30mA    Time 4 hour patch                    PT Short Term Goals - 03/08/20 1143      PT SHORT TERM GOAL #1   Title Pt will be independent with HEP    Status Achieved             PT Long Term Goals - 03/15/20 1342      PT LONG TERM GOAL #4   Title increase SLR of the right leg to 60 degrees    Time 6    Period Weeks    Status New      PT LONG TERM GOAL #5   Title decrease instances of right hip pain 50%    Time 6    Period Weeks    Status New                  Plan - 03/15/20 1340    Clinical Impression Statement We have been seeing patient for left knee pain, he reports that is feeling better, saw MD about her right hip , he has had cortisone injeciton in the past but him and the Md deferred this, he id very tight in the LE, especially HS and ITB.    Stability/Clinical Decision Making  Stable/Uncomplicated    Clinical Decision Making Low    Rehab Potential Good    PT Frequency 2x / week    PT Duration 6 weeks    PT Treatment/Interventions ADLs/Self Care Home Management;Cryotherapy;Iontophoresis 4mg /ml Dexamethasone;Moist Heat;Gait training;Stair training;Functional mobility training;Therapeutic activities;Therapeutic exercise;Neuromuscular re-education;Manual techniques;Patient/family education;Passive range of motion;Vasopneumatic Device    PT Next Visit Plan he will be on vacation next week, gave him new HEP to focus on flexibility           Patient will benefit from skilled therapeutic intervention in order to improve the following deficits and impairments:  Abnormal gait, Difficulty walking, Pain, Impaired flexibility, Postural dysfunction  Visit Diagnosis: Pain in right hip - Plan: PT plan of care cert/re-cert  Difficulty in walking, not elsewhere classified - Plan: PT plan of care cert/re-cert     Problem List Patient Active Problem List   Diagnosis Date Noted  . Ingrown toenail of right foot 03/23/2019  . Current use of long term anticoagulation 01/20/2018  . EtOH dependence ([place]) 07/30/2017  . Paroxysmal atrial fibrillation ([place]) 07/29/2017  . Varicose veins of left lower extremity with complications 41/93/7902  . Greater trochanteric bursitis of right hip 01/02/2017  . Prediabetes 09/20/2016  . Rotator cuff syndrome of right shoulder 09/27/2015  . Vitamin D deficiency 09/07/2014  . Lichenoid keratosis 40/97/3532  . Superficial thrombosis of leg 03/28/2013  . 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 Inguinal hernia recurrent unilateral 08/10/2009  . History of colonic polyps 05/15/2008  . Essential hypertension 05/15/2008  . HYPERLIPIDEMIA 05/12/2007  . Elevated prostate specific antigen (PSA) 05/12/2007    Sumner Boast., PT 03/15/2020, 1:44 PM  [place]. [place], [place],  [address] Phone: [phone]   Fax:  [phone]  Name: [name] [name] MRN: [id number] Date of Birth: Oct 24, 1950

## 2020-03-15 NOTE — Patient Instructions (Signed)
Access Code: [email or web address] URL: [email or web address] Date: 03/15/2020 Prepared by: Lum Babe  Exercises Supine ITB Stretch with Strap - 2 x daily - 7 x weekly - 1 sets - 5 reps - 30 hold Standing ITB Stretch - 2 x daily - 7 x weekly - 1 sets - 5 reps - 30 hold Seated Table Hamstring Stretch - 2 x daily - 7 x weekly - 1 sets - 5 reps - 30 hold Supine Piriformis Stretch Pulling Heel to Hip - 2 x daily - 7 x weekly - 3 sets - 5 reps - 30 hold

## 2020-03-27 ENCOUNTER — Ambulatory Visit: Payer: PPO | Admitting: Physical Therapy

## 2020-03-29 ENCOUNTER — Ambulatory Visit: Payer: PPO | Attending: Family Medicine | Admitting: Physical Therapy

## 2020-03-29 ENCOUNTER — Other Ambulatory Visit: Payer: Self-pay

## 2020-03-29 ENCOUNTER — Encounter: Payer: Self-pay | Admitting: Physical Therapy

## 2020-03-29 DIAGNOSIS — M25551 Pain in right hip: Secondary | ICD-10-CM | POA: Diagnosis not present

## 2020-03-29 DIAGNOSIS — M25562 Pain in left knee: Secondary | ICD-10-CM | POA: Insufficient documentation

## 2020-03-29 DIAGNOSIS — R262 Difficulty in walking, not elsewhere classified: Secondary | ICD-10-CM | POA: Diagnosis not present

## 2020-03-29 DIAGNOSIS — M6281 Muscle weakness (generalized): Secondary | ICD-10-CM | POA: Insufficient documentation

## 2020-03-29 NOTE — Therapy (Signed)
[place]. [place], [place], [address] Phone: [phone]   Fax:  [phone]  Physical Therapy Treatment  Patient Details  Name: [name] [name] MRN: [id number] Date of Birth: November 02, 1950 Referring Provider (PT): Marikay Alar Date: 03/29/2020   PT End of Session - 03/29/20 1336    Visit Number 5    Date for PT Re-Evaluation 04/21/20    PT Start Time 1259    PT Stop Time 1335    PT Time Calculation (min) 36 min    Activity Tolerance Patient tolerated treatment well    Behavior During Therapy [facility] for tasks assessed/performed           Past Medical History:  Diagnosis Date  . Atrial fibrillation with RVR (HCC)    Converted to NSR with lopressor  . Benign prostatic hypertrophy with elevated PSA    Dr Alinda Money, High grade PIN w/o malignancy  . History of colonic polyps 2003, 2007   hyperplastic  . Hx of colonoscopy 2012   neg  . Hyperlipidemia   . Hypertension   . Retinal vascular occlusion    Dr Delman Cheadle    Past Surgical History:  Procedure Laterality Date  . APPENDECTOMY    . COLONOSCOPY W/ POLYPECTOMY      X2 , Dr Deatra Ina  . ENDOVENOUS ABLATION SAPHENOUS VEIN W/ LASER Left 09/29/2017   endovenous laser ablation left greater saphenous vein by Tinnie Gens MD   . HERNIA REPAIR  [phone]    Dr Margot Chimes  . lichenoid keratosis resected  8/14  . LIPOMA EXCISION     X 5  . PROSTATE BIOPSY  09/2010   Dr Alinda Money  . stab phlebectomy  Left 01/27/2018   stab phlebectomy 10-20 incisions left leg by Ruta Hinds MD    There were no vitals filed for this visit.   Subjective Assessment - 03/29/20 1255    Subjective Has not been doing HEP as much, L knee is 95% their, R hip has improved as well.    Currently in Pain? Yes    Pain Score 2     Pain Location Hip    Pain Orientation Right                             [facility] Adult PT Treatment/Exercise - 03/29/20 0001      Knee/Hip  Exercises: Stretches   Passive Hamstring Stretch 5 reps;10 seconds;Right    ITB Stretch Right;10 seconds;4 reps    Piriformis Stretch Right;4 reps;10 seconds;20 seconds    Other Knee/Hip Stretches RLE single K2C 5 x10 sec      Knee/Hip Exercises: Aerobic   Recumbent Bike L2.3 x 5 min       Knee/Hip Exercises: Standing   Lateral Step Up Both;1 set;10 reps;Hand Hold: 0;Step Height: 6"    Forward Step Up Both;1 set;10 reps;Hand Hold: 0;Step Height: 6"    Other Standing Knee Exercises Resisted side step 20lb x 5 each       Knee/Hip Exercises: Supine   Bridges Strengthening;10 reps;2 sets    Bridges with Clamshell Both;1 set;10 reps    Other Supine Knee/Hip Exercises Clam x 15       Iontophoresis   Type of Iontophoresis Dexamethasone    Location R GT    Dose 82mA    Time 4 hour patch  PT Short Term Goals - 03/08/20 1143      PT SHORT TERM GOAL #1   Title Pt will be independent with HEP    Status Achieved             PT Long Term Goals - 03/29/20 1337      PT LONG TERM GOAL #1   Title Pt will report ability to play full round of golf with no increase in L knee pain    Status On-going      PT LONG TERM GOAL #2   Title Pt will demo 5x STS <15 sec    Status On-going      PT LONG TERM GOAL #3   Title Pt will demo able to ascend/descend 1 flight of 6" stairs step over step with no increase in L knee pain    Status On-going                 Plan - 03/29/20 1337    Clinical Impression Statement Pt reports improvement overall despite not doing his HEP as much. No reports of pain today. Some knee adduction noted with sit to stand, but able to correct with cues. He stated he could feel the supine clams. He is very tight in the R hip and R hamstring.    Examination-Activity Limitations Stairs;Stand;Transfers    Examination-Participation Restrictions Community Activity;Interpersonal Relationship    Stability/Clinical Decision Making  Stable/Uncomplicated    Rehab Potential Good    PT Frequency 2x / week    PT Duration 6 weeks    PT Next Visit Plan R hip strength and flexibility           Patient will benefit from skilled therapeutic intervention in order to improve the following deficits and impairments:  Abnormal gait, Difficulty walking, Pain, Impaired flexibility, Postural dysfunction  Visit Diagnosis: Difficulty in walking, not elsewhere classified  Pain in right hip     Problem List Patient Active Problem List   Diagnosis Date Noted  . Ingrown toenail of right foot 03/23/2019  . Current use of long term anticoagulation 01/20/2018  . EtOH dependence ([place]) 07/30/2017  . Paroxysmal atrial fibrillation ([place]) 07/29/2017  . Varicose veins of left lower extremity with complications 91/66/0600  . Greater trochanteric bursitis of right hip 01/02/2017  . Prediabetes 09/20/2016  . Rotator cuff syndrome of right shoulder 09/27/2015  . Vitamin D deficiency 09/07/2014  . Lichenoid keratosis 45/99/7741  . Superficial thrombosis of leg 03/28/2013  . 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 Inguinal hernia recurrent unilateral 08/10/2009  . History of colonic polyps 05/15/2008  . Essential hypertension 05/15/2008  . HYPERLIPIDEMIA 05/12/2007  . Elevated prostate specific antigen (PSA) 05/12/2007    Scot Jun, PTA 03/29/2020, 1:42 PM  [place]. [place], [place], [address] Phone: [phone]   Fax:  [phone]  Name: [name] [name] MRN: [id number] Date of Birth: 1951-02-13

## 2020-04-02 ENCOUNTER — Ambulatory Visit: Payer: PPO | Admitting: Physical Therapy

## 2020-04-02 ENCOUNTER — Other Ambulatory Visit: Payer: Self-pay

## 2020-04-02 ENCOUNTER — Encounter: Payer: Self-pay | Admitting: Physical Therapy

## 2020-04-02 DIAGNOSIS — R262 Difficulty in walking, not elsewhere classified: Secondary | ICD-10-CM | POA: Diagnosis not present

## 2020-04-02 DIAGNOSIS — M25551 Pain in right hip: Secondary | ICD-10-CM

## 2020-04-02 DIAGNOSIS — M6281 Muscle weakness (generalized): Secondary | ICD-10-CM

## 2020-04-02 NOTE — Therapy (Signed)
[place]. [place], [place], [address] Phone: [phone]   Fax:  [phone]  Physical Therapy Treatment  Patient Details  Name: [name] [name] MRN: [id number] Date of Birth: 08-Mar-1951 Referring Provider (PT): Marikay Alar Date: 04/02/2020   PT End of Session - 04/02/20 1058    Visit Number 6    Date for PT Re-Evaluation 04/21/20    PT Start Time 6599    PT Stop Time 1100    PT Time Calculation (min) 45 min    Activity Tolerance Patient tolerated treatment well    Behavior During Therapy [facility] for tasks assessed/performed           Past Medical History:  Diagnosis Date  . Atrial fibrillation with RVR (HCC)    Converted to NSR with lopressor  . Benign prostatic hypertrophy with elevated PSA    Dr Alinda Money, High grade PIN w/o malignancy  . History of colonic polyps 2003, 2007   hyperplastic  . Hx of colonoscopy 2012   neg  . Hyperlipidemia   . Hypertension   . Retinal vascular occlusion    Dr Delman Cheadle    Past Surgical History:  Procedure Laterality Date  . APPENDECTOMY    . COLONOSCOPY W/ POLYPECTOMY      X2 , Dr Deatra Ina  . ENDOVENOUS ABLATION SAPHENOUS VEIN W/ LASER Left 09/29/2017   endovenous laser ablation left greater saphenous vein by Tinnie Gens MD   . HERNIA REPAIR  [phone]    Dr Margot Chimes  . lichenoid keratosis resected  8/14  . LIPOMA EXCISION     X 5  . PROSTATE BIOPSY  09/2010   Dr Alinda Money  . stab phlebectomy  Left 01/27/2018   stab phlebectomy 10-20 incisions left leg by Ruta Hinds MD    There were no vitals filed for this visit.   Subjective Assessment - 04/02/20 1020    Subjective "Feels good today" Has been doing only the stretching from his HEP. Commented that his daughter is a PT and she was going to help him look over his stuff to do at home.    Currently in Pain? No/denies                             [facility] Adult PT Treatment/Exercise - 04/02/20  0001      Knee/Hip Exercises: Stretches   Passive Hamstring Stretch 1 rep;60 seconds   PNF hold-relax    ITB Stretch 30 seconds;1 rep    Piriformis Stretch Right;3 reps;20 seconds    Gastroc Stretch Right;3 reps;20 seconds    Other Knee/Hip Stretches HS flossing x10 ea leg    Other Knee/Hip Stretches SK2C 3x20" ea side      Knee/Hip Exercises: Aerobic   Recumbent Bike L3 19mins      Knee/Hip Exercises: Machines for Strengthening   Total Gym Leg Press #80 2x15      Knee/Hip Exercises: Standing   Heel Raises 20 reps   Cybex machine #40   Other Standing Knee Exercises Hip abd./ext #2.5 ankle wt.    Other Standing Knee Exercises hip hikes #2.5 (6" box)      Knee/Hip Exercises: Supine   Bridges 10 reps;Strengthening   3" hold at top     Knee/Hip Exercises: Sidelying   Clams Red TB x15    R side only  PT Short Term Goals - 03/08/20 1143      PT SHORT TERM GOAL #1   Title Pt will be independent with HEP    Status Achieved             PT Long Term Goals - 03/29/20 1337      PT LONG TERM GOAL #1   Title Pt will report ability to play full round of golf with no increase in L knee pain    Status On-going      PT LONG TERM GOAL #2   Title Pt will demo 5x STS <15 sec    Status On-going      PT LONG TERM GOAL #3   Title Pt will demo able to ascend/descend 1 flight of 6" stairs step over step with no increase in L knee pain    Status On-going                 Plan - 04/02/20 1058    Clinical Impression Statement Patient is doing well and is experiencing no hip pain just slight discomfort and tightness in hip flexor area. Introduced more strengthening and hip stability exercises he did well with all new ther ex activity just needed reminders and frequent cuing to perform exercises with correct technique as well as sets/reps.    PT Treatment/Interventions ADLs/Self Care Home Management;Cryotherapy;Iontophoresis 4mg /ml Dexamethasone;Moist  Heat;Gait training;Stair training;Functional mobility training;Therapeutic activities;Therapeutic exercise;Neuromuscular re-education;Manual techniques;Patient/family education;Passive range of motion;Vasopneumatic Device    PT Next Visit Plan R hip strength and flexability. Assure that he understands all HEP so that his daughter can help him at home.           Patient will benefit from skilled therapeutic intervention in order to improve the following deficits and impairments:  Abnormal gait, Difficulty walking, Pain, Impaired flexibility, Postural dysfunction  Visit Diagnosis: Difficulty in walking, not elsewhere classified  Pain in right hip  Muscle weakness (generalized)     Problem List Patient Active Problem List   Diagnosis Date Noted  . Ingrown toenail of right foot 03/23/2019  . Current use of long term anticoagulation 01/20/2018  . EtOH dependence ([place]) 07/30/2017  . Paroxysmal atrial fibrillation ([place]) 07/29/2017  . Varicose veins of left lower extremity with complications 41/93/7902  . Greater trochanteric bursitis of right hip 01/02/2017  . Prediabetes 09/20/2016  . Rotator cuff syndrome of right shoulder 09/27/2015  . Vitamin D deficiency 09/07/2014  . Lichenoid keratosis 40/97/3532  . Superficial thrombosis of leg 03/28/2013  . 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 Inguinal hernia recurrent unilateral 08/10/2009  . History of colonic polyps 05/15/2008  . Essential hypertension 05/15/2008  . HYPERLIPIDEMIA 05/12/2007  . Elevated prostate specific antigen (PSA) 05/12/2007    Lavenia Atlas, SPTA 04/02/2020, 11:05 AM  [place]. [place], [place], [address] Phone: [phone]   Fax:  [phone]  Name: [name] [name] MRN: [id number] Date of Birth: [birth year]

## 2020-04-04 ENCOUNTER — Other Ambulatory Visit: Payer: Self-pay

## 2020-04-04 ENCOUNTER — Ambulatory Visit: Payer: PPO | Admitting: Physical Therapy

## 2020-04-04 ENCOUNTER — Encounter: Payer: Self-pay | Admitting: Physical Therapy

## 2020-04-04 DIAGNOSIS — M25551 Pain in right hip: Secondary | ICD-10-CM

## 2020-04-04 DIAGNOSIS — M6281 Muscle weakness (generalized): Secondary | ICD-10-CM

## 2020-04-04 DIAGNOSIS — R262 Difficulty in walking, not elsewhere classified: Secondary | ICD-10-CM

## 2020-04-04 NOTE — Therapy (Signed)
[place] [place], [place], [address] Phone: [phone]   Fax:  [phone]  Physical Therapy Treatment  Patient Details  Name: [name] [name] MRN: [id number] Date of Birth: 1950-06-09 Referring Provider (PT): Marikay Alar Date: 04/04/2020   PT End of Session - 04/04/20 1020    Visit Number 7    Date for PT Re-Evaluation 04/21/20    PT Start Time 3893    PT Stop Time 1058    PT Time Calculation (min) 43 min    Activity Tolerance Patient tolerated treatment well    Behavior During Therapy [facility] for tasks assessed/performed           Past Medical History:  Diagnosis Date  . Atrial fibrillation with RVR (HCC)    Converted to NSR with lopressor  . Benign prostatic hypertrophy with elevated PSA    Dr Alinda Money, High grade PIN w/o malignancy  . History of colonic polyps 2003, 2007   hyperplastic  . Hx of colonoscopy 2012   neg  . Hyperlipidemia   . Hypertension   . Retinal vascular occlusion    Dr Delman Cheadle    Past Surgical History:  Procedure Laterality Date  . APPENDECTOMY    . COLONOSCOPY W/ POLYPECTOMY      X2 , Dr Deatra Ina  . ENDOVENOUS ABLATION SAPHENOUS VEIN W/ LASER Left 09/29/2017   endovenous laser ablation left greater saphenous vein by Tinnie Gens MD   . HERNIA REPAIR  [phone]    Dr Margot Chimes  . lichenoid keratosis resected  8/14  . LIPOMA EXCISION     X 5  . PROSTATE BIOPSY  09/2010   Dr Alinda Money  . stab phlebectomy  Left 01/27/2018   stab phlebectomy 10-20 incisions left leg by Ruta Hinds MD    There were no vitals filed for this visit.   Subjective Assessment - 04/04/20 1017    Subjective "Doing good today" Felt great after last session    Currently in Pain? No/denies                             [facility] Adult PT Treatment/Exercise - 04/04/20 0001      Knee/Hip Exercises: Stretches   Passive Hamstring Stretch 2 reps;30 seconds    Other Knee/Hip Stretches Hip  IR/ER str. 3x30" (passive)      Knee/Hip Exercises: Aerobic   Recumbent Bike L3 7 mins      Knee/Hip Exercises: Machines for Strengthening   Cybex Knee Extension #15 2x10    Cybex Knee Flexion #35 2x10    Total Gym Leg Press #80 2x15      Knee/Hip Exercises: Standing   Heel Raises 20 reps    Heel Raises Limitations #40      Knee/Hip Exercises: Supine   Bridges with Ball Squeeze 20 reps   3" hold at top     Knee/Hip Exercises: Sidelying   Hip ABduction Both;10 reps    Clams Red TB 2x10                    PT Short Term Goals - 03/08/20 1143      PT SHORT TERM GOAL #1   Title Pt will be independent with HEP    Status Achieved             PT Long Term Goals - 03/29/20 1337      PT LONG TERM  GOAL #1   Title Pt will report ability to play full round of golf with no increase in L knee pain    Status On-going      PT LONG TERM GOAL #2   Title Pt will demo 5x STS <15 sec    Status On-going      PT LONG TERM GOAL #3   Title Pt will demo able to ascend/descend 1 flight of 6" stairs step over step with no increase in L knee pain    Status On-going                 Plan - 04/04/20 1021    Clinical Impression Statement Patient is responding well to treatrment [place] patient statment as well as his ability to continue to show progress in being able to tolerate an increase in ther ex activity with no c/o of pain.  Needed tactile cuing to keep hips rolled forward during S/L clamshells to emphasize hip external rotators. Also needed cuing to keep hips rolled forward during S/L hip abd, to prevent compensation of using hip flexors. He still shows decreased strength in Bil LE's and hips.    PT Treatment/Interventions ADLs/Self Care Home Management;Cryotherapy;Iontophoresis 4mg /ml Dexamethasone;Moist Heat;Gait training;Stair training;Functional mobility training;Therapeutic activities;Therapeutic exercise;Neuromuscular re-education;Manual techniques;Patient/family  education;Passive range of motion;Vasopneumatic Device    PT Next Visit Plan Overall LE strengthening.           Patient will benefit from skilled therapeutic intervention in order to improve the following deficits and impairments:  Abnormal gait, Difficulty walking, Pain, Impaired flexibility, Postural dysfunction  Visit Diagnosis: Difficulty in walking, not elsewhere classified  Pain in right hip  Muscle weakness (generalized)     Problem List Patient Active Problem List   Diagnosis Date Noted  . Ingrown toenail of right foot 03/23/2019  . Current use of long term anticoagulation 01/20/2018  . EtOH dependence ([place]) 07/30/2017  . Paroxysmal atrial fibrillation ([place]) 07/29/2017  . Varicose veins of left lower extremity with complications 69/48/5462  . Greater trochanteric bursitis of right hip 01/02/2017  . Prediabetes 09/20/2016  . Rotator cuff syndrome of right shoulder 09/27/2015  . Vitamin D deficiency 09/07/2014  . Lichenoid keratosis 70/35/0093  . Superficial thrombosis of leg 03/28/2013  . 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 Inguinal hernia recurrent unilateral 08/10/2009  . History of colonic polyps 05/15/2008  . Essential hypertension 05/15/2008  . HYPERLIPIDEMIA 05/12/2007  . Elevated prostate specific antigen (PSA) 05/12/2007    Lavenia Atlas 04/04/2020, 11:03 AM  [place]. [place], [place], [address] Phone: [phone]   Fax:  [phone]  Name: Derin WHITTAKER LENIS MRN: [id number] Date of Birth: [birth year]

## 2020-04-10 ENCOUNTER — Encounter: Payer: Self-pay | Admitting: Physical Therapy

## 2020-04-10 ENCOUNTER — Other Ambulatory Visit: Payer: Self-pay

## 2020-04-10 ENCOUNTER — Ambulatory Visit: Payer: PPO | Admitting: Physical Therapy

## 2020-04-10 DIAGNOSIS — M6281 Muscle weakness (generalized): Secondary | ICD-10-CM

## 2020-04-10 DIAGNOSIS — R262 Difficulty in walking, not elsewhere classified: Secondary | ICD-10-CM | POA: Diagnosis not present

## 2020-04-10 DIAGNOSIS — M25562 Pain in left knee: Secondary | ICD-10-CM

## 2020-04-10 DIAGNOSIS — M25551 Pain in right hip: Secondary | ICD-10-CM

## 2020-04-10 NOTE — Therapy (Addendum)
[place]. [place], [place], [address] Phone: [phone]   Fax:  [phone]  Physical Therapy Treatment  Patient Details  Name: [name] [name] MRN: [id number] Date of Birth: 06-Dec-1950 Referring Provider (PT): Marikay Alar Date: 04/10/2020   PT End of Session - 04/10/20 1223    Visit Number 8    Date for PT Re-Evaluation 04/21/20    PT Start Time 1143    PT Stop Time 1228    PT Time Calculation (min) 45 min    Activity Tolerance Patient tolerated treatment well    Behavior During Therapy [facility] for tasks assessed/performed           Past Medical History:  Diagnosis Date  . Atrial fibrillation with RVR (HCC)    Converted to NSR with lopressor  . Benign prostatic hypertrophy with elevated PSA    Dr Alinda Money, High grade PIN w/o malignancy  . History of colonic polyps 2003, 2007   hyperplastic  . Hx of colonoscopy 2012   neg  . Hyperlipidemia   . Hypertension   . Retinal vascular occlusion    Dr Delman Cheadle    Past Surgical History:  Procedure Laterality Date  . APPENDECTOMY    . COLONOSCOPY W/ POLYPECTOMY      X2 , Dr Deatra Ina  . ENDOVENOUS ABLATION SAPHENOUS VEIN W/ LASER Left 09/29/2017   endovenous laser ablation left greater saphenous vein by Tinnie Gens MD   . HERNIA REPAIR  [phone]    Dr Margot Chimes  . lichenoid keratosis resected  8/14  . LIPOMA EXCISION     X 5  . PROSTATE BIOPSY  09/2010   Dr Alinda Money  . stab phlebectomy  Left 01/27/2018   stab phlebectomy 10-20 incisions left leg by Ruta Hinds MD    There were no vitals filed for this visit.   Subjective Assessment - 04/10/20 1147    Subjective R leg has been sore the past 4 to 5 days    Currently in Pain? No/denies                             [facility] Adult PT Treatment/Exercise - 04/10/20 0001      Knee/Hip Exercises: Aerobic   Recumbent Bike L3 6 mins      Knee/Hip Exercises: Machines for Strengthening    Cybex Knee Extension #15 2x10; RLE 5 2x10     Cybex Knee Flexion #35 2x10; RLE 15lb 2x10     Total Gym Leg Press 70lb 3x10       Knee/Hip Exercises: Standing   Lateral Step Up Both;1 set;10 reps;Hand Hold: 0;Step Height: 6"    Forward Step Up Both;1 set;10 reps;Hand Hold: 0;Step Height: 6"    Other Standing Knee Exercises Hip abd./ext red 2x10 each       Iontophoresis   Type of Iontophoresis Dexamethasone    Location R GT    Dose 77m    Time 4 hour patch                    PT Short Term Goals - 03/08/20 1143      PT SHORT TERM GOAL #1   Title Pt will be independent with HEP    Status Achieved             PT Long Term Goals - 03/29/20 1337      PT LONG TERM GOAL #1  Title Pt will report ability to play full round of golf with no increase in L knee pain    Status On-going      PT LONG TERM GOAL #2   Title Pt will demo 5x STS <15 sec    Status On-going      PT LONG TERM GOAL #3   Title Pt will demo able to ascend/descend 1 flight of 6" stairs step over step with no increase in L knee pain    Status On-going                 Plan - 04/10/20 1223    Clinical Impression Statement Pt enters the clinic reporting increase R hip soreness but does not know why. Reports no functional limitations despite soreness. He did well with the interventions. Reports weakness with RLE and he did well with the isolated strengthening of RLE.    Examination-Activity Limitations Stairs;Stand;Transfers    Examination-Participation Restrictions Community Activity;Interpersonal Relationship    Stability/Clinical Decision Making Stable/Uncomplicated    Rehab Potential Good    PT Duration 6 weeks    PT Treatment/Interventions ADLs/Self Care Home Management;Cryotherapy;Iontophoresis 33m/ml Dexamethasone;Moist Heat;Gait training;Stair training;Functional mobility training;Therapeutic activities;Therapeutic exercise;Neuromuscular re-education;Manual techniques;Patient/family  education;Passive range of motion;Vasopneumatic Device    PT Next Visit Plan Overall LE strengthening.           Patient will benefit from skilled therapeutic intervention in order to improve the following deficits and impairments:  Abnormal gait, Difficulty walking, Pain, Impaired flexibility, Postural dysfunction  Visit Diagnosis: Muscle weakness (generalized)  Acute pain of left knee  Pain in right hip  Difficulty in walking, not elsewhere classified     Problem List Patient Active Problem List   Diagnosis Date Noted  . Ingrown toenail of right foot 03/23/2019  . Current use of long term anticoagulation 01/20/2018  . [place] 07/30/2017  . [place] 07/29/[place]  . Varicose veins of left lower extremity with complications 198/03/9146 . Greater trochanteric bursitis of right hip 01/02/2017  . Prediabetes 09/20/2016  . Rotator cuff syndrome of right shoulder 09/27/2015  . Vitamin D deficiency 09/07/2014  . Lichenoid keratosis 082/95/6213 . Superficial thrombosis of leg 03/28/2013  . 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 Inguinal hernia recurrent unilateral 08/10/2009  . History of colonic polyps 05/15/2008  . Essential hypertension 05/15/2008  . HYPERLIPIDEMIA 05/12/2007  . Elevated prostate specific antigen (PSA) 05/12/2007   PHYSICAL THERAPY DISCHARGE SUMMARY  Visits from Start of Care: 8 Plan: Patient agrees to discharge.  Patient goals were partially met. Patient is being discharged due to being pleased with the current functional level.  ?????      RScot Jun PTA 04/10/2020, 12:29 PM  [place] [address]Phone: [phone]  Fax:  [phone] Name: [name] OGIAVANNI ODONOVANMRN: [id number] of Birth: [birth year]

## 2020-04-27 DIAGNOSIS — R972 Elevated prostate specific antigen [PSA]: Secondary | ICD-10-CM | POA: Diagnosis not present

## 2020-05-03 DIAGNOSIS — H31002 Unspecified chorioretinal scars, left eye: Secondary | ICD-10-CM | POA: Diagnosis not present

## 2020-05-03 DIAGNOSIS — H52203 Unspecified astigmatism, bilateral: Secondary | ICD-10-CM | POA: Diagnosis not present

## 2020-05-04 DIAGNOSIS — N3941 Urge incontinence: Secondary | ICD-10-CM | POA: Diagnosis not present

## 2020-05-04 DIAGNOSIS — R972 Elevated prostate specific antigen [PSA]: Secondary | ICD-10-CM | POA: Diagnosis not present

## 2020-05-04 DIAGNOSIS — N401 Enlarged prostate with lower urinary tract symptoms: Secondary | ICD-10-CM | POA: Diagnosis not present

## 2020-05-05 ENCOUNTER — Other Ambulatory Visit: Payer: Self-pay | Admitting: Cardiovascular Disease

## 2020-05-29 ENCOUNTER — Ambulatory Visit: Payer: PPO | Admitting: Cardiovascular Disease

## 2020-05-29 ENCOUNTER — Encounter: Payer: Self-pay | Admitting: Cardiovascular Disease

## 2020-05-29 ENCOUNTER — Other Ambulatory Visit: Payer: Self-pay

## 2020-05-29 VITALS — BP 156/82 | HR 61 | Ht 76.0 in | Wt 219.3 lb

## 2020-05-29 DIAGNOSIS — I10 Essential (primary) hypertension: Secondary | ICD-10-CM

## 2020-05-29 DIAGNOSIS — I480 Paroxysmal atrial fibrillation: Secondary | ICD-10-CM | POA: Diagnosis not present

## 2020-05-29 DIAGNOSIS — Z7901 Long term (current) use of anticoagulants: Secondary | ICD-10-CM

## 2020-05-29 NOTE — Progress Notes (Signed)
Cardiology Office Note:    Date:  05/29/2020   ID:  [name] [name], DOB [birth year], MRN [id number]  PCP:  Binnie Rail, MD  Cardiologist:  Sanda Klein, MD  Electrophysiologist:  None   Referring MD: Binnie Rail, MD   Chief Complaint  Patient presents with  . Atrial Fibrillation    History of Present Illness:    [name] [name] is a 70 y.o. male with a hx of paroxysmal atrial fibrillation led to a brief hospitalization in March 2019, without clinical recurrence.  He has systemic hypertension but no structural heart disease other than mild left ventricular hypertrophy.  His left atrium was normal in size and left ventricular systolic function is normal.  He does not have significant valvular abnormalities.  He has known very well over the last year and has not been troubled by palpitations. The patient specifically denies any chest pain at rest exertion, dyspnea at rest or with exertion, orthopnea, paroxysmal nocturnal dyspnea, syncope, palpitations, focal neurological deficits, intermittent claudication, lower extremity edema, unexplained weight gain, cough, hemoptysis or wheezing.  He exercises regularly and frequently achieves heart rates in the 120s on his elliptical.  Generally manages to get in 30 minutes of exercise every day.  He denies any falls or bleeding problems.  His systolic blood pressure is a little high today, but just a month ago his blood pressure was 133/73.  He has had ablation of the right greater saphenous vein, but does not have a history of DVT.  He described fatigue with the beta-blockers and the dose of Lopressor was reduced to 12.5 mg twice daily which led to improvement in his symptoms.     Past Medical History:  Diagnosis Date  . Atrial fibrillation with RVR (HCC)    Converted to NSR with lopressor  . Benign prostatic hypertrophy with elevated PSA    Dr Alinda Money, High grade PIN w/o malignancy  . History of colonic polyps 2003, 2007   hyperplastic   . Hx of colonoscopy 2012   neg  . Hyperlipidemia   . Hypertension   . Retinal vascular occlusion    Dr Delman Cheadle    Past Surgical History:  Procedure Laterality Date  . APPENDECTOMY    . COLONOSCOPY W/ POLYPECTOMY      X2 , Dr Deatra Ina  . ENDOVENOUS ABLATION SAPHENOUS VEIN W/ LASER Left 09/29/2017   endovenous laser ablation left greater saphenous vein by Tinnie Gens MD   . HERNIA REPAIR  [phone]    Dr Margot Chimes  . lichenoid keratosis resected  8/14  . LIPOMA EXCISION     X 5  . PROSTATE BIOPSY  09/2010   Dr Alinda Money  . stab phlebectomy  Left 01/27/2018   stab phlebectomy 10-20 incisions left leg by Ruta Hinds MD    Current Medications: Current Meds  Medication Sig  . Cholecalciferol (VITAMIN D) 50 MCG (2000 UT) CAPS   . ELIQUIS 5 MG TABS tablet Take 1 tablet by mouth twice daily  . irbesartan (AVAPRO) 75 MG tablet Take 1 tablet by mouth once daily  . Melatonin 10 MG TABS   . metoprolol succinate (TOPROL-XL) 25 MG 24 hr tablet Take 1 tablet by mouth once daily     Allergies:   Patient has no known allergies.   Social History   Socioeconomic History  . Marital status: Married    Spouse name: Not on file  . Number of children: Not on file  . Years of education: Not on file  .  Highest education level: Not on file  Occupational History  . Not on file  Tobacco Use  . Smoking status: Never Smoker  . Smokeless tobacco: Never Used  Vaping Use  . Vaping Use: Never used  Substance and Sexual Activity  . Alcohol use: Yes    Alcohol/week: 3.0 standard drinks    Types: 3 Glasses of wine Antwone week    Comment:  4 glasses of wine/day in the past, < 1/day now  . Drug use: No  . Sexual activity: Not on file  Other Topics Concern  . Not on file  Social History Narrative  . Not on file   Social Determinants of Health   Financial Resource Strain: Not on file  Food Insecurity: Not on file  Transportation Needs: Not on file  Physical Activity: Not on file  Stress: Not on  file  Social Connections: Not on file     Family History: The patient's family history includes Alzheimer's disease in his mother; Heart attack (age of onset: 51) in his father; Hypertension in his brother and maternal grandmother. There is no history of Diabetes or Stroke.  ROS:   Please see the history of present illness.    All other systems are reviewed and are negative.  EKGs/Labs/Other Studies Reviewed:    EKG:  EKG is ordered today.  Shows normal sinus rhythm is a completely normal tracing. Recent Labs: 11/25/2019: ALT 38; BUN 18; Creatinine, Ser 0.85; Hemoglobin 15.3; Platelets 212.0; Potassium 5.4 No hemolysis seen; Sodium 138; TSH 1.99  Recent Lipid Panel    Component Value Date/Time   CHOL 186 11/25/2019 1029   TRIG 49.0 11/25/2019 1029   HDL 87.60 11/25/2019 1029   CHOLHDL 2 11/25/2019 1029   VLDL 9.8 11/25/2019 1029   LDLCALC 89 11/25/2019 1029   LDLDIRECT 108.9 05/17/2009 0000    Physical Exam:    VS:  BP (!) 156/82   Pulse 61   Ht 6\' 4"  (1.93 m)   Wt 219 lb 4.8 oz (99.5 kg)   BMI 26.69 kg/m     Wt Readings from Last 3 Encounters:  05/29/20 219 lb 4.8 oz (99.5 kg)  03/12/20 214 lb 3.2 oz (97.2 kg)  01/09/20 221 lb 6.4 oz (100.4 kg)     General: Alert, oriented x3, no distress, appears well.  Moderately overweight. Head: no evidence of trauma, PERRL, EOMI, no exophtalmos or lid lag, no myxedema, no xanthelasma; normal ears, nose and oropharynx Neck: normal jugular venous pulsations and no hepatojugular reflux; brisk carotid pulses without delay and no carotid bruits Chest: clear to auscultation, no signs of consolidation by percussion or palpation, normal fremitus, symmetrical and full respiratory excursions Cardiovascular: normal position and quality of the apical impulse, regular rhythm, normal first and second heart sounds, no murmurs, rubs or gallops Abdomen: no tenderness or distention, no masses by palpation, no abnormal pulsatility or arterial bruits,  normal bowel sounds, no hepatosplenomegaly Extremities: no clubbing, cyanosis or edema; 2+ radial, ulnar and brachial pulses bilaterally; 2+ right femoral, posterior tibial and dorsalis pedis pulses; 2+ left femoral, posterior tibial and dorsalis pedis pulses; no subclavian or femoral bruits Neurological: grossly nonfocal Psych: Normal mood and affect    ASSESSMENT:    1. Paroxysmal atrial fibrillation (HCC)   2. Essential hypertension   3. Current use of long term anticoagulation    PLAN:    In order of problems listed above:  1. AFib: Episodes are very infrequent.  He only tolerates a very low-dose of  beta-blocker. 2. HTN: Blood pressure is a little high today.  Asked him to check at home and send me a weeks worth of blood pressure results through [place]. 3. Eliquis: Well-tolerated without bleeding complications.   Medication Adjustments/Labs and Tests Ordered: Current medicines are reviewed at length with the patient today.  Concerns regarding medicines are outlined above.  Orders Placed This Encounter  Procedures  . EKG 12-Lead   No orders of the defined types were placed in this encounter.   Patient Instructions  Medication Instructions:  No changes *If you need a refill on your cardiac medications before your next appointment, please call your pharmacy*   Lab Work: None ordered If you have labs (blood work) drawn today and your tests are completely normal, you will receive your results only by: Marland Kitchen MyChart Message (if you have MyChart) OR . A paper copy in the mail If you have any lab test that is abnormal or we need to change your treatment, we will call you to review the results.   Testing/Procedures: None ordered   Follow-Up: At [facility], you and your health needs are our priority.  As part of our continuing mission to provide you with exceptional heart care, we have created designated Provider Care Teams.  These Care Teams include your primary  Cardiologist (physician) and Advanced Practice Providers (APPs -  Physician Assistants and Nurse Practitioners) who all work together to provide you with the care you need, when you need it.  We recommend signing up for the patient portal called "MyChart".  Sign up information is provided on this After Visit Summary.  MyChart is used to connect with patients for Virtual Visits (Telemedicine).  Patients are able to view lab/test results, encounter notes, upcoming appointments, etc.  Non-urgent messages can be sent to your provider as well.   To learn more about what you can do with MyChart, go to [email or web address].    Your next appointment:   12 month(s)  The format for your next appointment:   In Person  Provider:   You may see Sanda Klein, MD or one of the following Advanced Practice Providers on your designated Care Team:    Almyra Deforest, PA-C  Fabian Sharp, [place] or   Roby Lofts, [place]    Other Instructions Dr. Sallyanne Kuster would like you to check your blood pressure daily for the next  week.  Keep a journal of these daily blood pressure and heart rate readings and call our office or send a message through [place] with the results. Thank you!  It is best to check your BP 1-2 hours after taking your medications to see the medications effectiveness on your BP.    Here are some tips that our clinical pharmacists share for home BP monitoring:          Rest 5 minutes before taking your blood pressure.          Don't smoke or drink caffeinated beverages for at least 30 minutes before.          Take your blood pressure before (not after) you eat.          Sit comfortably with your back supported and both feet on the floor (don't cross your legs).          Elevate your arm to heart level on a table or a desk.          Use the proper sized cuff. It should fit smoothly and snugly around your bare upper  arm. There should be enough room to slip a fingertip under the cuff. The bottom  edge of the cuff should be 1 inch above the crease of the elbow.      Signed, Sanda Klein, MD  05/29/2020 11:41 AM    [facility]

## 2020-05-29 NOTE — Patient Instructions (Signed)
Medication Instructions:  No changes *If you need a refill on your cardiac medications before your next appointment, please call your pharmacy*   Lab Work: None ordered If you have labs (blood work) drawn today and your tests are completely normal, you will receive your results only by: . MyChart Message (if you have MyChart) OR . A paper copy in the mail If you have any lab test that is abnormal or we need to change your treatment, we will call you to review the results.   Testing/Procedures: None ordered   Follow-Up: At [facility], you and your health needs are our priority.  As part of our continuing mission to provide you with exceptional heart care, we have created designated Provider Care Teams.  These Care Teams include your primary Cardiologist (physician) and Advanced Practice Providers (APPs -  Physician Assistants and Nurse Practitioners) who all work together to provide you with the care you need, when you need it.  We recommend signing up for the patient portal called "MyChart".  Sign up information is provided on this After Visit Summary.  MyChart is used to connect with patients for Virtual Visits (Telemedicine).  Patients are able to view lab/test results, encounter notes, upcoming appointments, etc.  Non-urgent messages can be sent to your provider as well.   To learn more about what you can do with MyChart, go to [email or web address].    Your next appointment:   12 month(s)  The format for your next appointment:   In Person  Provider:   You may see Mihai Croitoru, MD or one of the following Advanced Practice Providers on your designated Care Team:    Hao Meng, PA-C  Angela Duke, PA-C or   Krista Kroeger, PA-C    Other Instructions Dr. Croitoru would like you to check your blood pressure daily for the next  week.  Keep a journal of these daily blood pressure and heart rate readings and call our office or send a message through MyChart with the results.  Thank you!  It is best to check your BP 1-2 hours after taking your medications to see the medications effectiveness on your BP.    Here are some tips that our clinical pharmacists share for home BP monitoring:          Rest 5 minutes before taking your blood pressure.          Don't smoke or drink caffeinated beverages for at least 30 minutes before.          Take your blood pressure before (not after) you eat.          Sit comfortably with your back supported and both feet on the floor (don't cross your legs).          Elevate your arm to heart level on a table or a desk.          Use the proper sized cuff. It should fit smoothly and snugly around your bare upper arm. There should be enough room to slip a fingertip under the cuff. The bottom edge of the cuff should be 1 inch above the crease of the elbow.   

## 2020-08-02 ENCOUNTER — Other Ambulatory Visit: Payer: Self-pay | Admitting: Cardiovascular Disease

## 2020-08-09 ENCOUNTER — Ambulatory Visit: Payer: Self-pay

## 2020-08-09 ENCOUNTER — Other Ambulatory Visit: Payer: Self-pay

## 2020-08-09 ENCOUNTER — Ambulatory Visit (INDEPENDENT_AMBULATORY_CARE_PROVIDER_SITE_OTHER): Payer: PPO

## 2020-08-09 ENCOUNTER — Ambulatory Visit: Payer: PPO | Admitting: Family Medicine

## 2020-08-09 ENCOUNTER — Encounter: Payer: Self-pay | Admitting: Family Medicine

## 2020-08-09 VITALS — BP 140/98 | HR 68 | Ht 76.0 in | Wt 221.2 lb

## 2020-08-09 DIAGNOSIS — M25511 Pain in right shoulder: Secondary | ICD-10-CM

## 2020-08-09 DIAGNOSIS — L814 Other melanin hyperpigmentation: Secondary | ICD-10-CM | POA: Diagnosis not present

## 2020-08-09 DIAGNOSIS — D485 Neoplasm of uncertain behavior of skin: Secondary | ICD-10-CM | POA: Diagnosis not present

## 2020-08-09 DIAGNOSIS — L821 Other seborrheic keratosis: Secondary | ICD-10-CM | POA: Diagnosis not present

## 2020-08-09 DIAGNOSIS — Z85828 Personal history of other malignant neoplasm of skin: Secondary | ICD-10-CM | POA: Diagnosis not present

## 2020-08-09 DIAGNOSIS — D1721 Benign lipomatous neoplasm of skin and subcutaneous tissue of right arm: Secondary | ICD-10-CM | POA: Diagnosis not present

## 2020-08-09 DIAGNOSIS — L570 Actinic keratosis: Secondary | ICD-10-CM | POA: Diagnosis not present

## 2020-08-09 DIAGNOSIS — L918 Other hypertrophic disorders of the skin: Secondary | ICD-10-CM | POA: Diagnosis not present

## 2020-08-09 DIAGNOSIS — D225 Melanocytic nevi of trunk: Secondary | ICD-10-CM | POA: Diagnosis not present

## 2020-08-09 NOTE — Progress Notes (Signed)
I, Wendy Poet, LAT, ATC, am serving as scribe for Dr. Lynne Leader.  [name] [name] is a 70 y.o. male who presents to [place] at [facility] today for R shoulder pain.  He was last seen by Dr. Georgina Snell on 03/12/20 for R hip pain.  Since then, pt reports chronic R shoulder pain that has recently flared up over the past few weeks.  He locates his pain to his R lateral upper arm .  R shoulder mechanical symptoms: No Aggravating factors: any attempts at R shoulder AROM Treatments tried:Nothing  Diagnostic testing: R shoulder XR- 01/02/17  Pertinent review of systems: No fevers or chills  Relevant historical information: A. fib,   Exam:  BP (!) 140/98 (BP Location: Left Arm, Patient Position: Sitting, Cuff Size: Normal)   Pulse 68   Ht 6\' 4"  (1.93 m)   Wt 221 lb 3.2 oz (100.3 kg)   SpO2 97%   BMI 26.93 kg/m  General: Well Developed, well nourished, and in no acute distress.   MSK: Right shoulder normal-appearing nontender. Range of motion decreased range of motion to abduction at 80 degrees external rotation 20 degrees neutral position internal rotation to lower portion of lumbar spine. Strength intact within limits of motion Positive Hawkins and Neer's test however test is somewhat nondiagnostic given difficulty with adequate positioning test. Negative Yergason's and speeds test.    Lab and Radiology Results  X-ray images right shoulder obtained today personally and independently interpreted Mild AC DJD.  Mild glenohumeral DJD.  No acute fractures. Await formal radiology review  Procedure: Real-time Ultrasound Guided Injection of right shoulder posterior glenohumeral joint Device: Philips Affiniti 50G Images permanently stored and available for review in PACS Ultrasound evaluation prior to injection reveals intact rotator cuff tendons without significant subacromial bursitis. Verbal informed consent obtained.  Discussed risks and benefits of procedure.  Warned about infection bleeding damage to structures skin hypopigmentation and fat atrophy among others. Patient expresses understanding and agreement Time-out conducted.   Noted no overlying erythema, induration, or other signs of local infection.   Skin prepped in a sterile fashion.   Local anesthesia: Topical Ethyl chloride.   With sterile technique and under real time ultrasound guidance:  40 mg of Kenalog and 2 mL of Marcaine injected into glenohumeral joint. Fluid seen entering the joint.   Completed without difficulty   Pain moderately resolved suggesting accurate placement of the medication.   Advised to call if fevers/chills, erythema, induration, drainage, or persistent bleeding.   Images permanently stored and available for review in the ultrasound unit.  Impression: Technically successful ultrasound guided injection.         Assessment and Plan: 70 y.o. male with new acute shoulder pain with lack of range of motion.  This is concerning for adhesive capsulitis.  Plan for glenohumeral injection and referral to physical therapy.  Patient has a golf trip coming up next week.  Hopefully he will be feeling a little bit better with less pain by then and able to participate in golf.  Physical therapy should be quite helpful.  Recheck back with me in about a month.   PDMP not reviewed this encounter. Orders Placed This Encounter  Procedures  . [place] LIMITED JOINT SPACE STRUCTURES UP RIGHT(NO LINKED CHARGES)    Order Specific Question:   Reason for Exam (SYMPTOM  OR DIAGNOSIS REQUIRED)    Answer:   rt shoulder pain    Order Specific Question:   Preferred imaging location?  Answer:   [place]  . DG Shoulder Right    Standing Status:   Future    Number of Occurrences:   1    Standing Expiration Date:   08/09/2021    Order Specific Question:   Reason for Exam (SYMPTOM  OR DIAGNOSIS REQUIRED)    Answer:   eval shoulder pain    Order Specific Question:    Preferred imaging location?    Answer:   [facility]  . Ambulatory referral to Physical Therapy    Referral Priority:   Routine    Referral Type:   Physical Medicine    Referral Reason:   Specialty Services Required    Requested Specialty:   Physical Therapy   No orders of the defined types were placed in this encounter.    Discussed warning signs or symptoms. Please see discharge instructions. Patient expresses understanding.   The above documentation has been reviewed and is accurate and complete Lynne Leader, M.D.

## 2020-08-09 NOTE — Patient Instructions (Addendum)
Thank you for coming in today.  Please get an Xray today before you leave  Please perform the exercise program that we have prepared for you and gone over in detail on a daily basis.  In addition to the handout you were provided you can access your program through: [email or web address]   Your unique program code is:  [email or web address]   I've referred you to Physical Therapy.  Let us know if you don't hear from them in one week.  Call or go to the ER if you develop a large red swollen joint with extreme pain or oozing puss.   Recheck in 1 month.

## 2020-08-13 ENCOUNTER — Ambulatory Visit: Payer: PPO | Admitting: Family Medicine

## 2020-08-13 ENCOUNTER — Other Ambulatory Visit: Payer: Self-pay | Admitting: Cardiovascular Disease

## 2020-08-13 NOTE — Progress Notes (Signed)
X-ray right shoulder shows some mild arthritis

## 2020-09-10 ENCOUNTER — Ambulatory Visit: Payer: PPO | Admitting: Family Medicine

## 2020-09-25 ENCOUNTER — Encounter: Payer: Self-pay | Admitting: Internal Medicine

## 2020-09-25 ENCOUNTER — Ambulatory Visit (INDEPENDENT_AMBULATORY_CARE_PROVIDER_SITE_OTHER): Payer: PPO

## 2020-09-25 DIAGNOSIS — I10 Essential (primary) hypertension: Secondary | ICD-10-CM | POA: Diagnosis not present

## 2020-09-25 DIAGNOSIS — Z0000 Encounter for general adult medical examination without abnormal findings: Secondary | ICD-10-CM | POA: Diagnosis not present

## 2020-09-25 NOTE — Patient Instructions (Signed)
[name] [name] , Thank you for taking time to come for your Medicare Wellness Visit. I appreciate your ongoing commitment to your health goals. Please review the following plan we discussed and let me know if I can assist you in the future.   Screening recommendations/referrals: Colonoscopy: 07/11/2010; due every 10 years; overdue; referral placed Recommended yearly ophthalmology/optometry visit for glaucoma screening and checkup Recommended yearly dental visit for hygiene and checkup  Vaccinations: Influenza vaccine: 01/20/2020 Pneumococcal vaccine: 01/22/2014, 04/06/2015 Tdap vaccine: 10/02/2011; due every 10 years Shingles vaccine: 01/06/2018, 04/21/2018*   Covid-19: 06/12/2019, 07/03/2019, 02/15/2020  Advanced directives: Please bring a copy of your health care power of attorney and living will to the office at your convenience.  Conditions/risks identified: Yes; Reviewed health maintenance screenings with patient today and relevant education, vaccines, and/or referrals were provided. Please continue to do your personal lifestyle choices by: daily care of teeth and gums, regular physical activity (goal should be 5 days a week for 30 minutes), eat a healthy diet, avoid tobacco and drug use, limiting any alcohol intake, taking a low-dose aspirin (if not allergic or have been advised by your provider otherwise) and taking vitamins and minerals as recommended by your provider. Continue doing brain stimulating activities (puzzles, reading, adult coloring books, staying active) to keep memory sharp. Continue to eat heart healthy diet (full of fruits, vegetables, whole grains, lean protein, water--limit salt, fat, and sugar intake) and increase physical activity as tolerated.  Next appointment: Please schedule your next Medicare Wellness Visit with your Nurse Health Advisor in 1 year by calling [phone]. Preventive Care 72 Years and Older, Male Preventive care refers to lifestyle choices and visits with  your health care provider that can promote health and wellness. What does preventive care include?  A yearly physical exam. This is also called an annual well check.  Dental exams once or twice a year.  Routine eye exams. Ask your health care provider how often you should have your eyes checked.  Personal lifestyle choices, including:  Daily care of your teeth and gums.  Regular physical activity.  Eating a healthy diet.  Avoiding tobacco and drug use.  Limiting alcohol use.  Practicing safe sex.  Taking low doses of aspirin every day.  Taking vitamin and mineral supplements as recommended by your health care provider. What happens during an annual well check? The services and screenings done by your health care provider during your annual well check will depend on your age, overall health, lifestyle risk factors, and family history of disease. Counseling  Your health care provider may ask you questions about your:  Alcohol use.  Tobacco use.  Drug use.  Emotional well-being.  Home and relationship well-being.  Sexual activity.  Eating habits.  History of falls.  Memory and ability to understand (cognition).  Work and work Statistician. Screening  You may have the following tests or measurements:  Height, weight, and BMI.  Blood pressure.  Lipid and cholesterol levels. These may be checked every 5 years, or more frequently if you are over 81 years old.  Skin check.  Lung cancer screening. You may have this screening every year starting at age 68 if you have a 30-pack-year history of smoking and currently smoke or have quit within the past 15 years.  Fecal occult blood test (FOBT) of the stool. You may have this test every year starting at age 25.  Flexible sigmoidoscopy or colonoscopy. You may have a sigmoidoscopy every 5 years or a colonoscopy every 10  years starting at age 48.  Prostate cancer screening. Recommendations will vary depending on your  family history and other risks.  Hepatitis C blood test.  Hepatitis B blood test.  Sexually transmitted disease (STD) testing.  Diabetes screening. This is done by checking your blood sugar (glucose) after you have not eaten for a while (fasting). You may have this done every 1-3 years.  Abdominal aortic aneurysm (AAA) screening. You may need this if you are a current or former smoker.  Osteoporosis. You may be screened starting at age 48 if you are at high risk. Talk with your health care provider about your test results, treatment options, and if necessary, the need for more tests. Vaccines  Your health care provider may recommend certain vaccines, such as:  Influenza vaccine. This is recommended every year.  Tetanus, diphtheria, and acellular pertussis (Tdap, Td) vaccine. You may need a Td booster every 10 years.  Zoster vaccine. You may need this after age 3.  Pneumococcal 13-valent conjugate (PCV13) vaccine. One dose is recommended after age 7.  Pneumococcal polysaccharide (PPSV23) vaccine. One dose is recommended after age 10. Talk to your health care provider about which screenings and vaccines you need and how often you need them. This information is not intended to replace advice given to you by your health care provider. Make sure you discuss any questions you have with your health care provider. Document Released: 06/01/2015 Document Revised: 01/23/2016 Document Reviewed: 03/06/2015 Elsevier Interactive Patient Education  2017 [place] Prevention in the Home Falls can cause injuries. They can happen to people of all ages. There are many things you can do to make your home safe and to help prevent falls. What can I do on the outside of my home?  Regularly fix the edges of walkways and driveways and fix any cracks.  Remove anything that might make you trip as you walk through a door, such as a raised step or threshold.  Trim any bushes or trees on the  path to your home.  Use bright outdoor lighting.  Clear any walking paths of anything that might make someone trip, such as rocks or tools.  Regularly check to see if handrails are loose or broken. Make sure that both sides of any steps have handrails.  Any raised decks and porches should have guardrails on the edges.  Have any leaves, snow, or ice cleared regularly.  Use sand or salt on walking paths during winter.  Clean up any spills in your garage right away. This includes oil or grease spills. What can I do in the bathroom?  Use night lights.  Install grab bars by the toilet and in the tub and shower. Do not use towel bars as grab bars.  Use non-skid mats or decals in the tub or shower.  If you need to sit down in the shower, use a plastic, non-slip stool.  Keep the floor dry. Clean up any water that spills on the floor as soon as it happens.  Remove soap buildup in the tub or shower regularly.  Attach bath mats securely with double-sided non-slip rug tape.  Do not have throw rugs and other things on the floor that can make you trip. What can I do in the bedroom?  Use night lights.  Make sure that you have a light by your bed that is easy to reach.  Do not use any sheets or blankets that are too big for your bed. They should not hang  down onto the floor.  Have a firm chair that has side arms. You can use this for support while you get dressed.  Do not have throw rugs and other things on the floor that can make you trip. What can I do in the kitchen?  Clean up any spills right away.  Avoid walking on wet floors.  Keep items that you use a lot in easy-to-reach places.  If you need to reach something above you, use a strong step stool that has a grab bar.  Keep electrical cords out of the way.  Do not use floor polish or wax that makes floors slippery. If you must use wax, use non-skid floor wax.  Do not have throw rugs and other things on the floor that can  make you trip. What can I do with my stairs?  Do not leave any items on the stairs.  Make sure that there are handrails on both sides of the stairs and use them. Fix handrails that are broken or loose. Make sure that handrails are as long as the stairways.  Check any carpeting to make sure that it is firmly attached to the stairs. Fix any carpet that is loose or worn.  Avoid having throw rugs at the top or bottom of the stairs. If you do have throw rugs, attach them to the floor with carpet tape.  Make sure that you have a light switch at the top of the stairs and the bottom of the stairs. If you do not have them, ask someone to add them for you. What else can I do to help prevent falls?  Wear shoes that:  Do not have high heels.  Have rubber bottoms.  Are comfortable and fit you well.  Are closed at the toe. Do not wear sandals.  If you use a stepladder:  Make sure that it is fully opened. Do not climb a closed stepladder.  Make sure that both sides of the stepladder are locked into place.  Ask someone to hold it for you, if possible.  Clearly mark and make sure that you can see:  Any grab bars or handrails.  First and last steps.  Where the edge of each step is.  Use tools that help you move around (mobility aids) if they are needed. These include:  Canes.  Walkers.  Scooters.  Crutches.  Turn on the lights when you go into a dark area. Replace any light bulbs as soon as they burn out.  Set up your furniture so you have a clear path. Avoid moving your furniture around.  If any of your floors are uneven, fix them.  If there are any pets around you, be aware of where they are.  Review your medicines with your doctor. Some medicines can make you feel dizzy. This can increase your chance of falling. Ask your doctor what other things that you can do to help prevent falls. This information is not intended to replace advice given to you by your health care  provider. Make sure you discuss any questions you have with your health care provider. Document Released: 03/01/2009 Document Revised: 10/11/2015 Document Reviewed: 06/09/2014 Elsevier Interactive Patient Education  2017 [employer].

## 2020-09-25 NOTE — Progress Notes (Signed)
I connected with [name] [name] today by telephone and verified that I am speaking with the correct person using two identifiers. Location patient: home Location provider: work Persons participating in the virtual visit: [name] [name] Ade and [employer], LPN.   I discussed the limitations, risks, security and privacy concerns of performing an evaluation and management service by telephone and the availability of in person appointments. I also discussed with the patient that there may be a patient responsible charge related to this service. The patient expressed understanding and verbally consented to this telephonic visit.    Interactive audio and video telecommunications were attempted between this provider and patient, however failed, due to patient having technical difficulties OR patient did not have access to video capability.  We continued and completed visit with audio only.  Some vital signs may be absent or patient reported.   Time Spent with patient on telephone encounter: 30 minutes  Subjective:   [name] [name] is a 70 y.o. male who presents for Medicare Annual/Subsequent preventive examination.  Review of Systems    No ROS. Medicare Wellness Visit. Additional risk factors are reflected in social history. Cardiac Risk Factors include: advanced age (>58men, >72 women);dyslipidemia;family history of premature cardiovascular disease;hypertension;male gender     Objective:    There were no vitals filed for this visit. There is no height or weight on file to calculate BMI.  Advanced Directives 09/25/2020 07/30/2017 07/29/2017 05/23/2015  Does Patient Have a Medical Advance Directive? Yes Yes Yes Yes  Type of Advance Directive Living will;Healthcare Power of [place] - -  Does patient want to make changes to medical advance directive? No - Patient declined No - Patient declined - -  Copy of [place] in Chart? - No - copy  requested - -  Would patient like information on creating a medical advance directive? - No - Patient declined - -    Current Medications (verified) Outpatient Encounter Medications as of 09/25/2020  Medication Sig  . Cholecalciferol (VITAMIN D) 50 MCG (2000 UT) CAPS   . ELIQUIS 5 MG TABS tablet Take 1 tablet by mouth twice daily  . irbesartan (AVAPRO) 75 MG tablet Take 1 tablet by mouth once daily  . metoprolol succinate (TOPROL-XL) 25 MG 24 hr tablet Take 1 tablet by mouth once daily  . [DISCONTINUED] Melatonin 10 MG TABS    No facility-administered encounter medications on file as of 09/25/2020.    Allergies (verified) Patient has no known allergies.   History: Past Medical History:  Diagnosis Date  . Atrial fibrillation with RVR (HCC)    Converted to NSR with lopressor  . Benign prostatic hypertrophy with elevated PSA    Dr Alinda Money, High grade PIN w/o malignancy  . History of colonic polyps 2003, 2007   hyperplastic  . Hx of colonoscopy 2012   neg  . Hyperlipidemia   . Hypertension   . Retinal vascular occlusion    Dr Delman Cheadle   Past Surgical History:  Procedure Laterality Date  . APPENDECTOMY    . COLONOSCOPY W/ POLYPECTOMY      X2 , Dr Deatra Ina  . ENDOVENOUS ABLATION SAPHENOUS VEIN W/ LASER Left 09/29/2017   endovenous laser ablation left greater saphenous vein by Tinnie Gens MD   . HERNIA REPAIR  [phone]    Dr Margot Chimes  . lichenoid keratosis resected  8/14  . LIPOMA EXCISION     X 5  . PROSTATE BIOPSY  09/2010  Dr Alinda Money  . stab phlebectomy  Left 01/27/2018   stab phlebectomy 10-20 incisions left leg by Ruta Hinds MD   Family History  Problem Relation Age of Onset  . Alzheimer's disease Mother   . Heart attack Father 51  . Hypertension Brother   . Hypertension Maternal Grandmother   . Diabetes Neg Hx   . Stroke Neg Hx    Social History   Socioeconomic History  . Marital status: Married    Spouse name: Not on file  . Number of children: Not on file   . Years of education: Not on file  . Highest education level: Not on file  Occupational History  . Not on file  Tobacco Use  . Smoking status: Never Smoker  . Smokeless tobacco: Never Used  Vaping Use  . Vaping Use: Never used  Substance and Sexual Activity  . Alcohol use: Yes    Alcohol/[name]: 3.0 standard drinks    Types: 3 Glasses of wine [name] [name]    Comment:  4 glasses of wine/day in the past, < 1/day now  . Drug use: No  . Sexual activity: Not on file  Other Topics Concern  . Not on file  Social History Narrative  . Not on file   Social Determinants of Health   Financial Resource Strain: Low Risk   . Difficulty of Paying Living Expenses: Not hard at all  Food Insecurity: No Food Insecurity  . Worried About Charity fundraiser in the Last [name]: Never true  . Ran Out of Food in the Last [name]: Never true  Transportation Needs: No Transportation Needs  . Lack of Transportation (Medical): No  . Lack of Transportation (Non-Medical): No  Physical Activity: Sufficiently Active  . Days of Exercise [name] [name]: 5 days  . Minutes of Exercise [name] [name]: 30 min  Stress: No Stress Concern Present  . Feeling of Stress : Not at all  Social Connections: Socially Integrated  . Frequency of Communication with Friends and Family: More than three times a [name]  . Frequency of Social Gatherings with Friends and Family: Once a [name]  . Attends Religious Services: More than 4 times [name] [name]  . Active Member of Clubs or Organizations: Yes  . Attends Archivist Meetings: More than 4 times [name] [name]  . Marital Status: Married    Tobacco Counseling Counseling given: Not Answered   Clinical Intake:  Pre-visit preparation completed: Yes  Pain : No/denies pain     Nutritional Risks: None Diabetes: No  What is the last grade level you completed in school?: Bachelor's Degree  Diabetic? no  Interpreter Needed?: No  Information entered by :: Lisette Abu,  LPN   Activities of Daily Living In your present state of health, do you have any difficulty performing the following activities: 09/25/2020  Hearing? Y  Comment has tinnitis  Vision? N  Difficulty concentrating or making decisions? N  Walking or climbing stairs? N  Dressing or bathing? N  Doing errands, shopping? N  Preparing Food and eating ? N  Using the Toilet? N  In the past six months, have you accidently leaked urine? N  Do you have problems with loss of bowel control? N  Managing your Medications? N  Managing your Finances? N  Housekeeping or managing your Housekeeping? N  Some recent data might be hidden    Patient Care Team: Binnie Rail, MD as PCP - General (Internal Medicine) Sanda Klein, MD as PCP - Cardiology (  Cardiology)  Indicate any recent Medical Services you may have received from other than [facility] providers in the past [name] (date may be approximate).     Assessment:   This is a routine wellness examination for Zaryan.  Hearing/Vision screen No exam data present  Dietary issues and exercise activities discussed: Current Exercise Habits: Home exercise routine, Type of exercise: walking;Other - see comments (golfing, gardening, yard work), Time (Minutes): 30, Frequency (Times/[name]): 5, Weekly Exercise (Minutes/[name]): 150, Intensity: Moderate, Exercise limited by: None identified  Goals Addressed            This Visit's Progress   . golf       To play more golf and less yard work.      Depression Screen PHQ 2/9 Scores 09/25/2020 12/14/2019 12/10/2018 06/10/2017 05/29/2016 05/23/2015 09/05/2014  PHQ - 2 Score 0 0 0 0 0 0 0    Fall Risk Fall Risk  09/25/2020 04/08/2019 12/10/2018 06/10/2017 05/29/2016  Falls in the past [name]? 0 0 0 No No  Comment - Emmi Telephone Survey: data to providers prior to load - - -  Number falls in past yr: 0 - - - -  Injury with Fall? 0 - - - -  Risk for fall due to : No Fall Risks - - - -  Follow up Falls evaluation completed  - - - -    FALL RISK PREVENTION PERTAINING TO THE HOME:  Any stairs in or around the home? Yes  If so, are there any without handrails? No  Home free of loose throw rugs in walkways, pet beds, electrical cords, etc? Yes  Adequate lighting in your home to reduce risk of falls? Yes   ASSISTIVE DEVICES UTILIZED TO PREVENT FALLS:  Life alert? Yes  Use of a cane, walker or w/c? No  Grab bars in the bathroom? Yes  Shower chair or bench in shower? Yes  Elevated toilet seat or a handicapped toilet? Yes   TIMED UP AND GO:  Was the test performed? No .  Length of time to ambulate 10 feet: 0 sec.   Gait steady and fast without use of assistive device  Cognitive Function: Normal cognitive status assessed by direct observation by this Nurse Health Advisor. No abnormalities found.          Immunizations Immunization History  Administered Date(s) Administered  . Fluad Quad(high Dose 65+) 01/20/2020  . H1N1 05/15/2008  . Influenza Split 02/17/2011, 02/29/2012  . Influenza Whole 03/16/2007, 02/12/2009, 03/05/2010  . Influenza, High Dose Seasonal PF 02/01/2014, 02/21/2017  . Influenza-Unspecified 02/23/2013, 02/12/2015, 02/12/2016, 02/21/2017, 03/01/2018, 01/20/2020  . PFIZER(Purple Top)SARS-COV-2 Vaccination 06/12/2019, 07/03/2019, 02/15/2020  . Pneumococcal Conjugate-13 02/01/2014  . Pneumococcal Polysaccharide-23 04/06/2015  . Td 02/22/2008  . Tdap 10/02/2011  . Zoster 06/01/2014  . Zoster Recombinat (Shingrix) 01/06/2018, 04/21/2018    TDAP status: Up to date  Flu Vaccine status: Up to date  Pneumococcal vaccine status: Up to date  Covid-19 vaccine status: Completed vaccines  Qualifies for Shingles Vaccine? Yes   Zostavax completed Yes   Shingrix Completed?: Yes  Screening Tests Health Maintenance  Topic Date Due  . COLONOSCOPY (Pts 45-60yrs Insurance coverage will need to be confirmed)  07/11/2020  . COVID-19 Vaccine (4 - Booster for Pfizer series) 08/14/2020  .  INFLUENZA VACCINE  12/17/2020  . TETANUS/TDAP  10/01/2021  . Hepatitis C Screening  Completed  . PNA vac Low Risk Adult  Completed  . HPV VACCINES  Aged Out    Health Maintenance  Health Maintenance Due  Topic Date Due  . COLONOSCOPY (Pts 45-36yrs Insurance coverage will need to be confirmed)  07/11/2020  . COVID-19 Vaccine (4 - Booster for Pfizer series) 08/14/2020    Colorectal cancer screening: Referral to GI placed 09/25/2020. Pt aware the office will call re: appt.  Lung Cancer Screening: (Low Dose CT Chest recommended if Age 28-80 years, 30 pack-[name] currently smoking OR have quit w/in 15years.) does not qualify.   Lung Cancer Screening Referral: no  Additional Screening:  Hepatitis C Screening: does not qualify; Completed no  Vision Screening: Recommended annual ophthalmology exams for early detection of glaucoma and other disorders of the eye. Is the patient up to date with their annual eye exam?  Yes  Who is the provider or what is the name of the office in which the patient attends annual eye exams? Melissa Noon, MD. If pt is not established with a provider, would they like to be referred to a provider to establish care? No .   Dental Screening: Recommended annual dental exams for proper oral hygiene  Community Resource Referral / Chronic Care Management: CRR required this visit?  No   CCM required this visit?  Yes      Plan:     I have personally reviewed and noted the following in the patient's chart:   . Medical and social history . Use of alcohol, tobacco or illicit drugs  . Current medications and supplements including opioid prescriptions. Patient is not currently taking opioid prescriptions. . Functional ability and status . Nutritional status . Physical activity . Advanced directives . List of other physicians . Hospitalizations, surgeries, and ER visits in previous 12 months . Vitals . Screenings to include cognitive, depression, and  falls . Referrals and appointments  In addition, I have reviewed and discussed with patient certain preventive protocols, quality metrics, and best practice recommendations. A written personalized care plan for preventive services as well as general preventive health recommendations were provided to patient.     Sheral Flow, LPN   [id number]   Nurse Notes:  Patient is cogitatively intact. There were no vitals filed for this visit. There is no height or weight on file to calculate BMI.

## 2020-09-26 ENCOUNTER — Ambulatory Visit (INDEPENDENT_AMBULATORY_CARE_PROVIDER_SITE_OTHER): Payer: PPO | Admitting: Pharmacist

## 2020-09-26 ENCOUNTER — Other Ambulatory Visit: Payer: Self-pay

## 2020-09-26 DIAGNOSIS — E782 Mixed hyperlipidemia: Secondary | ICD-10-CM

## 2020-09-26 DIAGNOSIS — I10 Essential (primary) hypertension: Secondary | ICD-10-CM | POA: Diagnosis not present

## 2020-09-26 DIAGNOSIS — I480 Paroxysmal atrial fibrillation: Secondary | ICD-10-CM

## 2020-09-26 NOTE — Patient Instructions (Addendum)
Visit Information  Phone number for Pharmacist: [phone]  Thank you for meeting with me to discuss your medications! I look forward to working with you to achieve your health care goals. Below is a summary of what we talked about during the visit:  Goals Addressed            This Visit's Progress   . Manage My Medicine       Timeframe:  Long-Range Goal Priority:  Medium Start Date:    09/26/20                         Expected End Date:    09/26/21                   Follow Up Date 04/17/21   - call for medicine refill 2 or 3 days before it runs out - call if I am sick and can't take my medicine - keep a list of all the medicines I take; vitamins and herbals too    Why is this important?   . These steps will help you keep on track with your medicines.   Notes:        [name] [name] was given information about Chronic Care Management services today including:  1. CCM service includes personalized support from designated clinical staff supervised by his physician, including individualized plan of care and coordination with other care providers 2. 24/7 contact phone numbers for assistance for urgent and routine care needs. 3. Standard insurance, coinsurance, copays and deductibles apply for chronic care management only during months in which we provide at least 20 minutes of these services. Most insurances cover these services at 100%, however patients may be responsible for any copay, coinsurance and/or deductible if applicable. This service may help you avoid the need for more expensive face-to-face services. 4. Only one practitioner may furnish and bill the service in a calendar month. 5. The patient may stop CCM services at any time (effective at the end of the month) by phone call to the office staff.  Patient agreed to services and verbal consent obtained.   Patient verbalizes understanding of instructions provided today and agrees to view in [place].  Telephone follow up  appointment with pharmacy team member scheduled for: 1 year  Charlene Brooke, PharmD, [place], CPP Clinical Pharmacist [facility] [phone]  Cholesterol Content in Foods Cholesterol is a waxy, fat-like substance that helps to carry fat in the blood. The body needs cholesterol in small amounts, but too much cholesterol can cause damage to the arteries and heart. Most people should eat less than 200 milligrams (mg) of cholesterol a day. Foods with cholesterol Cholesterol is found in animal-based foods, such as meat, seafood, and dairy. Generally, low-fat dairy and lean meats have less cholesterol than full-fat dairy and fatty meats. The milligrams of cholesterol Emre serving (mg Dylann serving) of common cholesterol-containing foods are listed below. Meat and other proteins  Egg -- one large whole egg has 186 mg.  Veal shank -- 4 oz has 141 mg.  Lean ground [place] (93% lean) -- 4 oz has 118 mg.  Fat-trimmed lamb loin -- 4 oz has 106 mg.  Lean ground beef (90% lean) -- 4 oz has 100 mg.  Lobster -- 3.5 oz has 90 mg.  Pork loin chops -- 4 oz has 86 mg.  Canned salmon -- 3.5 oz has 83 mg.  Fat-trimmed beef top loin -- 4 oz  has 78 mg.  Frankfurter -- 1 frank (3.5 oz) has 77 mg.  Crab -- 3.5 oz has 71 mg.  Roasted chicken without skin, white meat -- 4 oz has 66 mg.  Light bologna -- 2 oz has 45 mg.  Deli-cut [place] -- 2 oz has 31 mg.  Canned tuna -- 3.5 oz has 31 mg.  Berniece Salines -- 1 oz has 29 mg.  Oysters and mussels (raw) -- 3.5 oz has 25 mg.  Mackerel -- 1 oz has 22 mg.  Trout -- 1 oz has 20 mg.  Pork sausage -- 1 link (1 oz) has 17 mg.  Salmon -- 1 oz has 16 mg.  Tilapia -- 1 oz has 14 mg. Dairy  Soft-serve ice cream --  cup (4 oz) has 103 mg.  Whole-milk yogurt -- 1 cup (8 oz) has 29 mg.  Cheddar cheese -- 1 oz has 28 mg.  American cheese -- 1 oz has 28 mg.  Whole milk -- 1 cup (8 oz) has 23 mg.  2% milk -- 1 cup (8 oz) has 18  mg.  Cream cheese -- 1 tablespoon (Tbsp) has 15 mg.  Cottage cheese --  cup (4 oz) has 14 mg.  Low-fat (1%) milk -- 1 cup (8 oz) has 10 mg.  Sour cream -- 1 Tbsp has 8.5 mg.  Low-fat yogurt -- 1 cup (8 oz) has 8 mg.  Nonfat Greek yogurt -- 1 cup (8 oz) has 7 mg.  Half-and-half cream -- 1 Tbsp has 5 mg. Fats and oils  Cod liver oil -- 1 tablespoon (Tbsp) has 82 mg.  Butter -- 1 Tbsp has 15 mg.  Lard -- 1 Tbsp has 14 mg.  Bacon grease -- 1 Tbsp has 14 mg.  Mayonnaise -- 1 Tbsp has 5-10 mg.  Margarine -- 1 Tbsp has 3-10 mg. Exact amounts of cholesterol in these foods may vary depending on specific ingredients and brands.   Foods without cholesterol Most plant-based foods do not have cholesterol unless you combine them with a food that has cholesterol. Foods without cholesterol include:  Grains and cereals.  Vegetables.  Fruits.  Vegetable oils, such as olive, canola, and sunflower oil.  Legumes, such as peas, beans, and lentils.  Nuts and seeds.  Egg whites.   Summary  The body needs cholesterol in small amounts, but too much cholesterol can cause damage to the arteries and heart.  Most people should eat less than 200 milligrams (mg) of cholesterol a day. This information is not intended to replace advice given to you by your health care provider. Make sure you discuss any questions you have with your health care provider. Document Revised: 09/26/2019 Document Reviewed: 09/26/2019 Elsevier Patient Education  [place].

## 2020-09-26 NOTE — Progress Notes (Signed)
Chronic Care Management Pharmacy Note  09/26/2020 Name:  [name] [name] MRN:  [id number] DOB:  1951-05-10  Subjective: [name] [name] is an 70 y.o. year old male who is a primary patient of Burns, Claudina Lick, MD.  The CCM team was consulted for assistance with disease management and care coordination needs.    Engaged with patient by telephone for initial visit in response to provider referral for pharmacy case management and/or care coordination services.   Consent to Services:  The patient was given the following information about Chronic Care Management services today, agreed to services, and gave verbal consent: 1. CCM service includes personalized support from designated clinical staff supervised by the primary care provider, including individualized plan of care and coordination with other care providers 2. 24/7 contact phone numbers for assistance for urgent and routine care needs. 3. Service will only be billed when office clinical staff spend 20 minutes or more in a month to coordinate care. 4. Only one practitioner may furnish and bill the service in a calendar month. 5.The patient may stop CCM services at any time (effective at the end of the month) by phone call to the office staff. 6. The patient will be responsible for cost sharing (co-pay) of up to 20% of the service fee (after annual deductible is met). Patient agreed to services and consent obtained.  Patient Care Team: Binnie Rail, MD as PCP - General (Internal Medicine) Sanda Klein, MD as PCP - Cardiology (Cardiology) Charlton Haws, [facility] as Pharmacist (Pharmacist)  Recent office visits: 12/14/19 Dr Quay Burow OV: annual exam. No med changes  Recent consult visits: 05/29/20 Dr Orene Desanctis (cardiology): f/u [facility] visits: None in previous 6 months  Objective:  Lab Results  Component Value Date   CREATININE 0.85 11/25/2019   BUN 18 11/25/2019   GFR 88.80 11/25/2019   GFRNONAA >60 07/30/2017   GFRAA >60  07/30/2017   NA 138 11/25/2019   K 5.4 No hemolysis seen (H) 11/25/2019   CALCIUM 9.5 11/25/2019   CO2 29 11/25/2019   GLUCOSE 104 (H) 11/25/2019    Lab Results  Component Value Date/Time   HGBA1C 5.5 11/25/2019 10:29 AM   HGBA1C 5.6 11/22/2018 08:51 AM   GFR 88.80 11/25/2019 10:29 AM   GFR 83.37 11/22/2018 08:51 AM    Last diabetic Eye exam: No results found for: HMDIABEYEEXA  Last diabetic Foot exam: No results found for: HMDIABFOOTEX   Lab Results  Component Value Date   CHOL 186 11/25/2019   HDL 87.60 11/25/2019   LDLCALC 89 11/25/2019   LDLDIRECT 108.9 05/17/2009   TRIG 49.0 11/25/2019   CHOLHDL 2 11/25/2019    Hepatic Function Latest Ref Rng & Units 11/25/2019 11/22/2018 07/30/2017  Total Protein 6.0 - 8.3 g/dL 7.1 6.7 6.1(L)  Albumin 3.5 - 5.2 g/dL 4.7 4.2 4.0  AST 0 - 37 U/L 32 32 32  ALT 0 - 53 U/L 38 37 24  Alk Phosphatase 39 - 117 U/L 61 58 50  Total Bilirubin 0.2 - 1.2 mg/dL 1.0 0.6 1.4(H)  Bilirubin, Direct 0.1 - 0.5 mg/dL - - 0.4    Lab Results  Component Value Date/Time   TSH 1.99 11/25/2019 10:29 AM   TSH 1.90 11/22/2018 08:51 AM    CBC Latest Ref Rng & Units 11/25/2019 11/22/2018 07/30/2017  WBC 4.0 - 10.5 K/uL 6.9 5.0 6.5  Hemoglobin 13.0 - 17.0 g/dL 15.3 14.6 14.7  Hematocrit 39.0 - 52.0 % 44.5 43.5 44.1  Platelets  150.0 - 400.0 K/uL 212.0 203.0 191    Lab Results  Component Value Date/Time   VD25OH 29.85 (L) 09/05/2014 10:14 AM    Clinical ASCVD: No  The 10-year ASCVD risk score Mikey Bussing DC Jr., et al., 2013) is: 21.5%   Values used to calculate the score:     Age: 48 years     Sex: Male     Is Non-Hispanic African American: No     Diabetic: No     Tobacco smoker: No     Systolic Blood Pressure: 469 mmHg     Is BP treated: Yes     HDL Cholesterol: 87.6 mg/dL     Total Cholesterol: 186 mg/dL    Depression screen [facility] 2/9 09/25/2020 12/14/2019 12/10/2018  Decreased Interest 0 0 0  Down, Depressed, Hopeless 0 0 0  PHQ - 2 Score 0 0 0      CHA2DS2-VASc Score = 2  The patient's score is based upon: CHF History: No HTN History: Yes Diabetes History: No Stroke History: No Vascular Disease History: No Age Score: 1 Gender Score: 0     Social History   Tobacco Use  Smoking Status Never Smoker  Smokeless Tobacco Never Used   BP Readings from Last 3 Encounters:  08/09/20 (!) 140/98  05/29/20 (!) 156/82  03/12/20 120/76   Pulse Readings from Last 3 Encounters:  08/09/20 68  05/29/20 61  03/12/20 (!) 59   Wt Readings from Last 3 Encounters:  08/09/20 221 lb 3.2 oz (100.3 kg)  05/29/20 219 lb 4.8 oz (99.5 kg)  03/12/20 214 lb 3.2 oz (97.2 kg)   BMI Readings from Last 3 Encounters:  08/09/20 26.93 kg/m  05/29/20 26.69 kg/m  03/12/20 26.07 kg/m    Assessment/Interventions: Review of patient past medical history, allergies, medications, health status, including review of consultants reports, laboratory and other test data, was performed as part of comprehensive evaluation and provision of chronic care management services.   SDOH:  (Social Determinants of Health) assessments and interventions performed: Yes  SDOH Screenings   Alcohol Screen: Low Risk   . Last Alcohol Screening Score (AUDIT): 4  Depression (PHQ2-9): Low Risk   . PHQ-2 Score: 0  Financial Resource Strain: Low Risk   . Difficulty of Paying Living Expenses: Not hard at all  Food Insecurity: No Food Insecurity  . Worried About Charity fundraiser in the Last Year: Never true  . Ran Out of Food in the Last Year: Never true  Housing: Low Risk   . Last Housing Risk Score: 0  Physical Activity: Sufficiently Active  . Days of Exercise Gavriel Week: 5 days  . Minutes of Exercise Jaxn Session: 30 min  Social Connections: Socially Integrated  . Frequency of Communication with Friends and Family: More than three times a week  . Frequency of Social Gatherings with Friends and Family: Once a week  . Attends Religious Services: More than 4 times [place] year   . Active Member of Clubs or Organizations: Yes  . Attends Archivist Meetings: More than 4 times Tom year  . Marital Status: Married  Stress: No Stress Concern Present  . Feeling of Stress : Not at all  Tobacco Use: Low Risk   . Smoking Tobacco Use: Never Smoker  . Smokeless Tobacco Use: Never Used  Transportation Needs: No Transportation Needs  . Lack of Transportation (Medical): No  . Lack of Transportation (Non-Medical): No    CCM Care Plan  No Known Allergies  Medications Reviewed Today    Reviewed by Charlton Haws, [facility] (Pharmacist) on 09/26/20 at 1447  Med List Status: <None>  Medication Order Taking? Sig Documenting Provider Last Dose Status Informant  Cholecalciferol (VITAMIN D) 50 MCG (2000 UT) CAPS [id number] Yes  [provider] Taking Active   ELIQUIS 5 MG TABS tablet [id number] Yes Take 1 tablet by mouth twice daily Croitoru, Mihai, MD Taking Active   irbesartan (AVAPRO) 75 MG tablet [id number] Yes Take 1 tablet by mouth once daily Croitoru, Mihai, MD Taking Active   metoprolol succinate (TOPROL-XL) 25 MG 24 hr tablet [id number] Yes Take 1 tablet by mouth once daily Croitoru, Mihai, MD Taking Active           Patient Active Problem List   Diagnosis Date Noted  . Ingrown toenail of right foot 03/23/2019  . Current use of long term anticoagulation 01/20/2018  . EtOH dependence ([place]) 07/30/2017  . Paroxysmal atrial fibrillation ([place]) 07/29/2017  . Varicose veins of left lower extremity with complications 32/91/9166  . Greater trochanteric bursitis of right hip 01/02/2017  . Prediabetes 09/20/2016  . Rotator cuff syndrome of right shoulder 09/27/2015  . Vitamin D deficiency 09/07/2014  . Lichenoid keratosis 06/00/4599  . Superficial thrombosis of leg 03/28/2013  . 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 Inguinal hernia recurrent unilateral 08/10/2009  . History of colonic polyps 05/15/2008  . Essential hypertension 05/15/2008  . HYPERLIPIDEMIA  05/12/2007  . Elevated prostate specific antigen (PSA) 05/12/2007    Immunization History  Administered Date(s) Administered  . Fluad Quad(high Dose 65+) 01/20/2020  . H1N1 05/15/2008  . Influenza Split 02/17/2011, 02/29/2012  . Influenza Whole 03/16/2007, 02/12/2009, 03/05/2010  . Influenza, High Dose Seasonal PF 02/01/2014, 02/21/2017  . Influenza-Unspecified 02/23/2013, 02/12/2015, 02/12/2016, 02/21/2017, 03/01/2018, 01/20/2020  . PFIZER(Purple Top)SARS-COV-2 Vaccination 06/12/2019, 07/03/2019, 02/15/2020  . Pneumococcal Conjugate-13 02/01/2014  . Pneumococcal Polysaccharide-23 04/06/2015  . Td 02/22/2008  . Tdap 10/02/2011  . Zoster 06/01/2014  . Zoster Recombinat (Shingrix) 01/06/2018, 04/21/2018    Conditions to be addressed/monitored:  Hypertension, Hyperlipidemia and Atrial Fibrillation  Care Plan : CCM Pharmacy Care Plan  Updates made by Charlton Haws, [place] since 09/26/2020 12:00 AM    Problem: Hypertension, Hyperlipidemia and Atrial Fibrillation   Priority: High    Long-Range Goal: Disease management   Start Date: 09/26/2020  Expected End Date: 09/26/2021  This Visit's Progress: On track  Priority: High  Note:   Current Barriers:  . Unable to independently monitor therapeutic efficacy  Pharmacist Clinical Goal(s):  Marland Kitchen Patient will achieve adherence to monitoring guidelines and medication adherence to achieve therapeutic efficacy through collaboration with PharmD and provider.   Interventions: . 1:1 collaboration with Binnie Rail, MD regarding development and update of comprehensive plan of care as evidenced by provider attestation and co-signature . Inter-disciplinary care team collaboration (see longitudinal plan of care) . Comprehensive medication review performed; medication list updated in electronic medical record  Hypertension (BP goal <140/90) -Controlled - BP in office is generally at goal; pt has checked at home in the past and BP largely <  140/90 -Current treatment: . Irbesartan 75 mg daily . Metoprolol succinate 25 mg daily -Current home readings: not checking recently -Denies hypotensive/hypertensive symptoms -Educated on BP goals and benefits of medications for prevention of heart attack, stroke and kidney damage; Daily salt intake goal < 2300 mg; -Counseled to monitor BP at home weekly, document, and provide log at future appointments -Recommended to continue current medication  Hyperlipidemia: (LDL goal <  100) -Controlled - LDL is at goal; ASCVD risk is elevated and patient does fall into statin-benefit group based on ASCVD risk of 21% -Current treatment: . No medication -Current exercise habits: yardwork, [employer] -Educated on Cholesterol goals;  Benefits of statin for ASCVD risk reduction; -Pt declined statin initiation at this time; continue to monitor  Atrial Fibrillation (Goal: prevent stroke and major bleeding) -Controlled - pt reports infrequent Afib since starting metoprolol; denies bleeding issues with Eliquis -CHADSVASC: 2 -Current treatment: . Rate control: Metoprolol succinate 25 mg daily . Anticoagulation: Eliquis 5 mg BID -Counseled on increased risk of stroke due to Afib and benefits of anticoagulation for stroke prevention; importance of adherence to anticoagulant exactly as prescribed; bleeding risk associated with Eliquis and importance of self-monitoring for signs/symptoms of bleeding;  -Recommended to continue current medication Assessed patient finances. He does not qualify for Eliquis assistance  Health Maintenance -Vaccine gaps: none -Current therapy:  Marland Kitchen Vitamin D 2000 IU daily -Patient is satisfied with current therapy and denies issues -Recommended to continue current medication  Patient Goals/Self-Care Activities . Patient will:  - take medications as prescribed focus on medication adherence by routine check blood pressure weekly, document, and provide at future  appointments  Follow Up Plan: Telephone follow up appointment with care management team member scheduled for: 1 year      Medication Assistance:  Patient does not qualify for Eliquis assistance due to income too high.  Patient's preferred pharmacy is:  [place], [place] - [address] [place] [address] Phone: [phone] Fax: [phone]  Uses pill box? No - prefers bottles Pt endorses 100% compliance  We discussed: Current pharmacy is preferred with insurance plan and patient is satisfied with pharmacy services Patient decided to: Continue current medication management strategy  Care Plan and Follow Up Patient Decision:  Patient agrees to Care Plan and Follow-up.  Plan: Telephone follow up appointment with care management team member scheduled for:  1 year  Charlene Brooke, PharmD, [place], CPP Clinical Pharmacist [facility] [phone]

## 2020-11-01 ENCOUNTER — Other Ambulatory Visit: Payer: Self-pay | Admitting: Cardiovascular Disease

## 2020-11-02 NOTE — Telephone Encounter (Signed)
Prescription refill request for Eliquis received. Indication:atrial fib Last office visit:1/22 Scr:0.8 Age: 70 Weight:100.3 kg  Prescription refilled

## 2020-11-14 IMAGING — DX DG KNEE AP/LAT W/ SUNRISE*L*
3 series · 3 of 3 positions shown · non-contrast
Comparison: None.

CLINICAL DATA: Left knee pain.

EXAM:
LEFT KNEE 3 VIEWS

[knee ap]
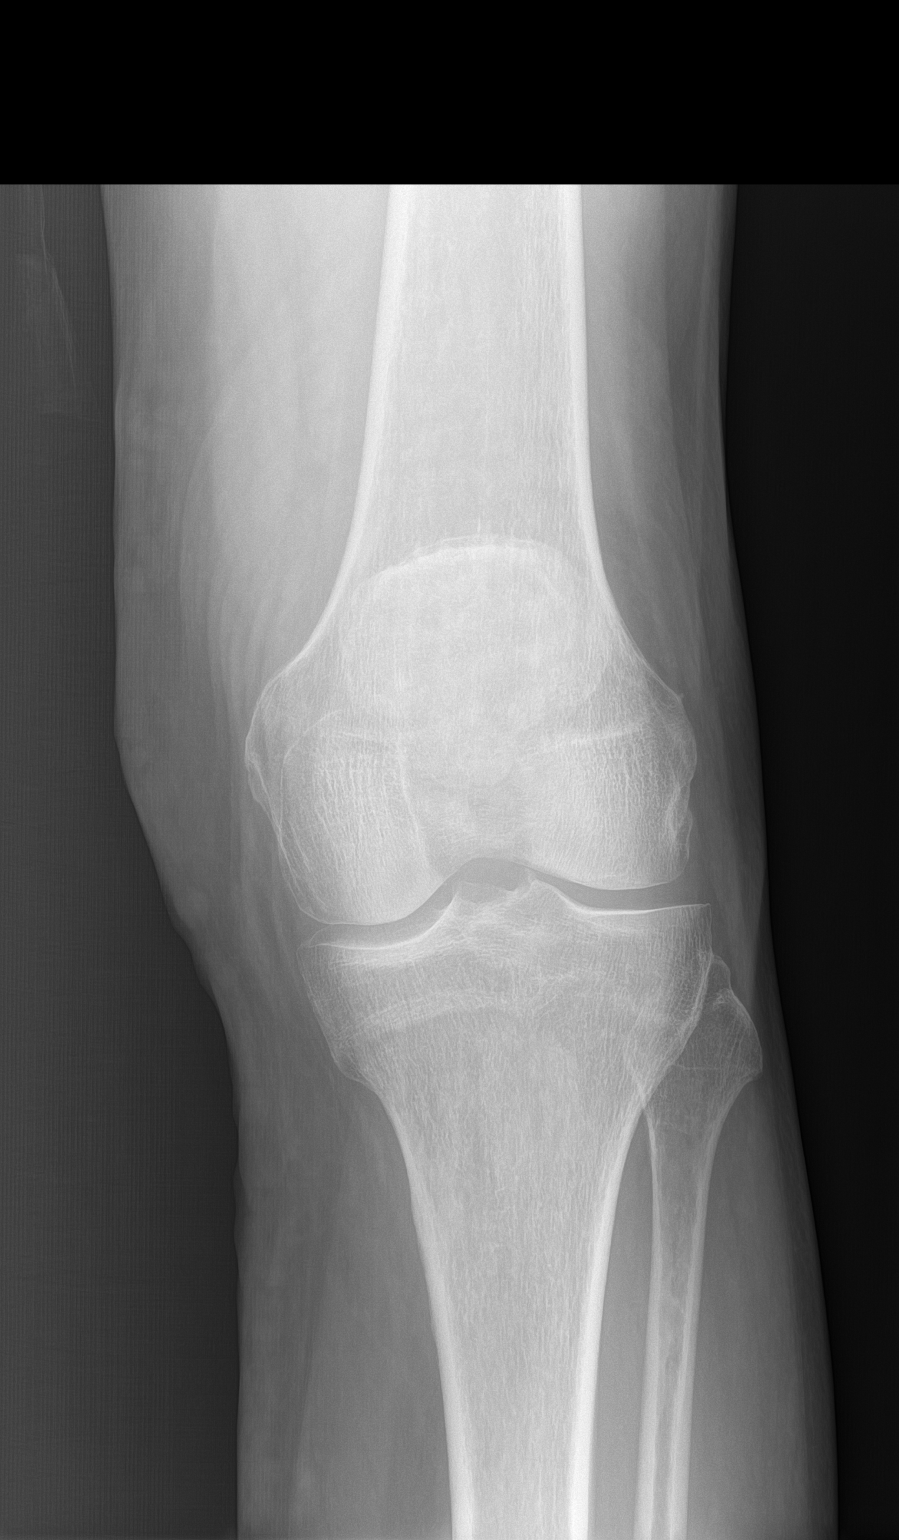

[knee lat]
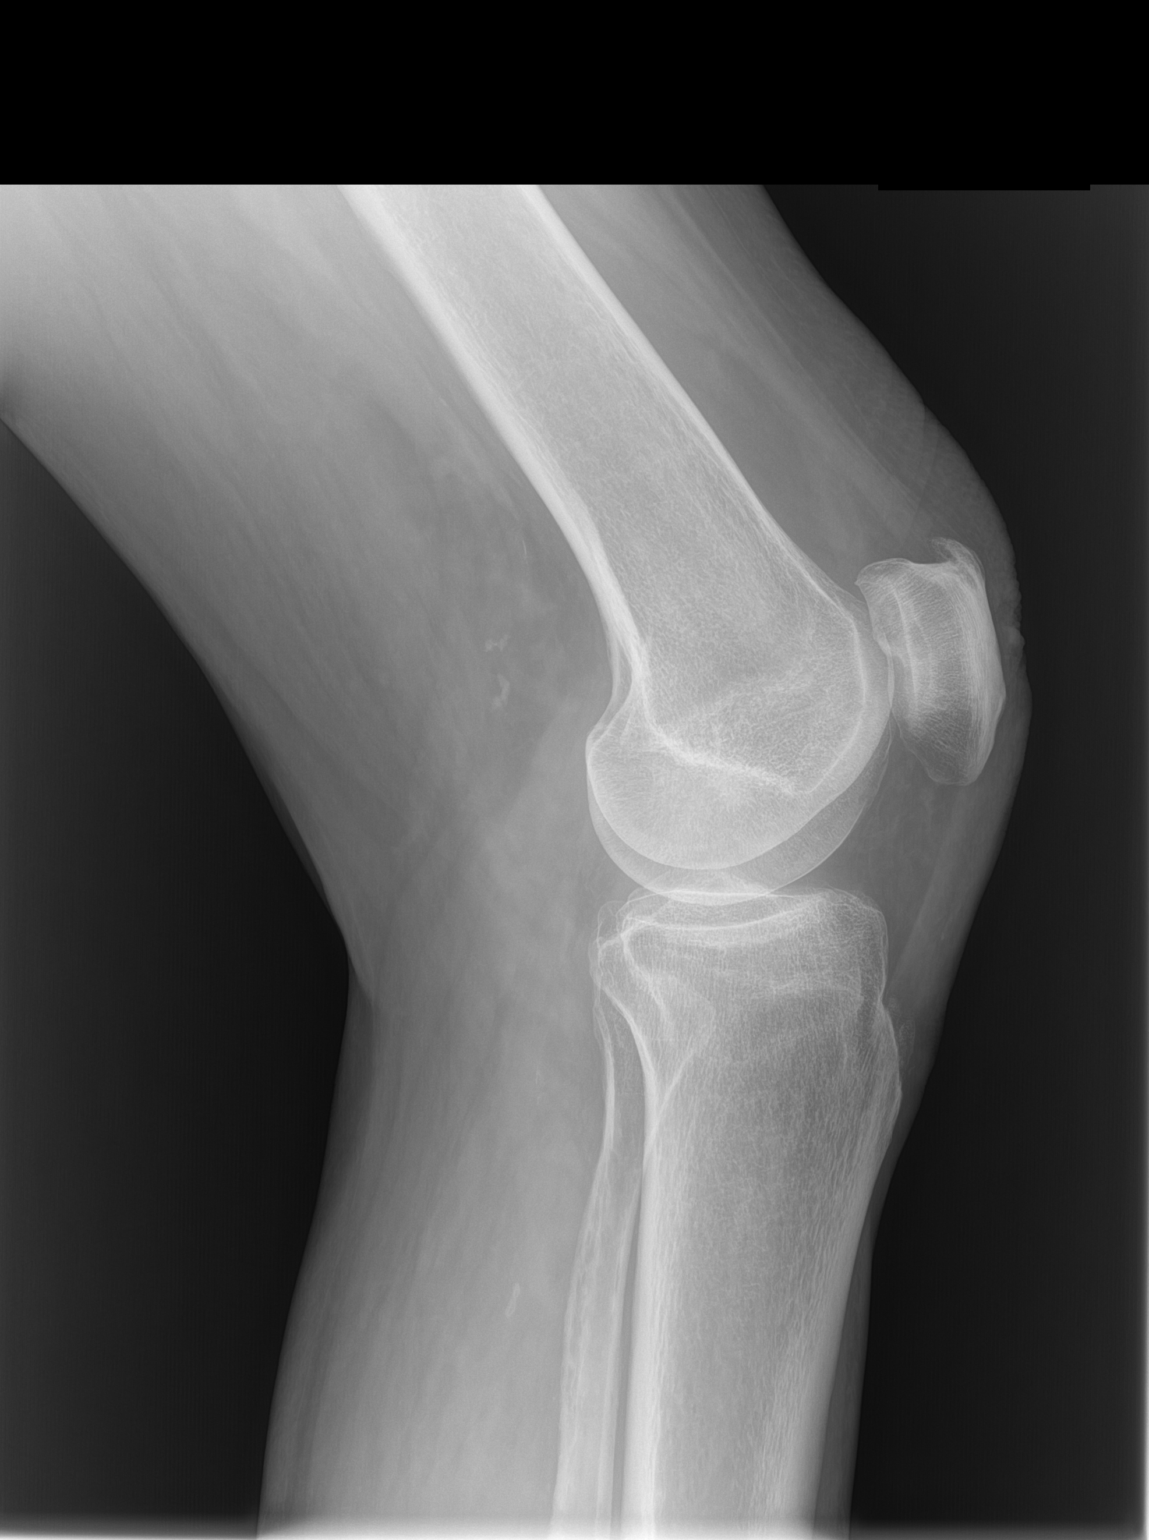

[patella]
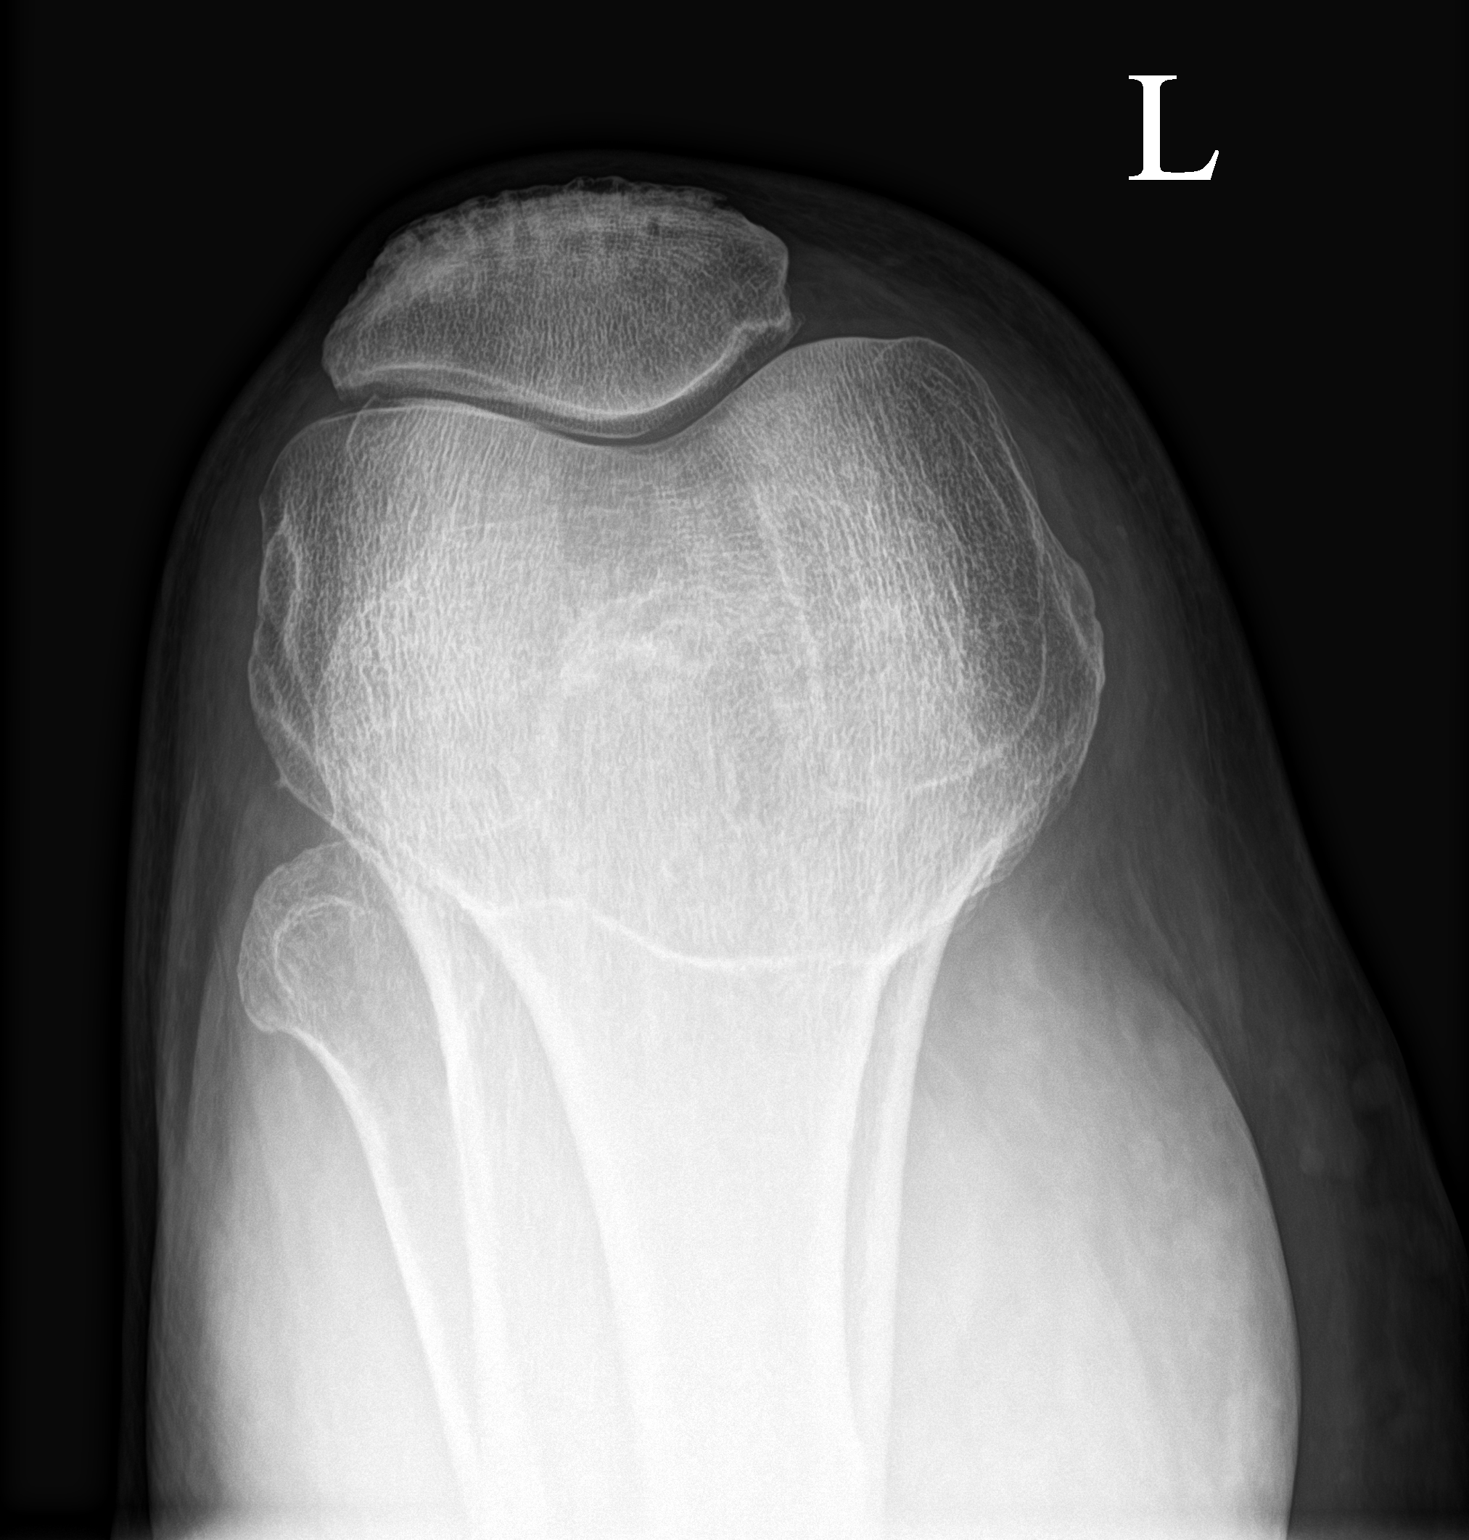

[3 of 3 positions shown; findings below may reference images not displayed]

FINDINGS: No evidence of fracture, dislocation, or joint effusion. No evidence
of arthropathy. Mild patellar spurring is noted. Soft tissues are
unremarkable.
IMPRESSION: Mild patellar spurring. No acute abnormality seen in the left knee.

## 2020-12-17 ENCOUNTER — Encounter: Payer: Self-pay | Admitting: Gastroenterology

## 2021-02-20 ENCOUNTER — Ambulatory Visit: Payer: PPO | Admitting: Orthopaedic Surgery

## 2021-02-20 ENCOUNTER — Other Ambulatory Visit: Payer: Self-pay

## 2021-02-20 ENCOUNTER — Ambulatory Visit: Payer: Self-pay

## 2021-02-20 VITALS — Ht 76.0 in | Wt 215.0 lb

## 2021-02-20 DIAGNOSIS — M25562 Pain in left knee: Secondary | ICD-10-CM | POA: Diagnosis not present

## 2021-02-20 DIAGNOSIS — G8929 Other chronic pain: Secondary | ICD-10-CM | POA: Diagnosis not present

## 2021-02-20 MED ORDER — LIDOCAINE HCL 1 % IJ SOLN
3.0000 mL | INTRAMUSCULAR | Status: AC | PRN
  Administered 2021-02-20: 3 mL

## 2021-02-20 MED ORDER — METHYLPREDNISOLONE ACETATE 40 MG/ML IJ SUSP
40.0000 mg | INTRAMUSCULAR | Status: AC | PRN
  Administered 2021-02-20: 40 mg via INTRA_ARTICULAR

## 2021-02-20 NOTE — Progress Notes (Signed)
Office Visit Note   Patient: [name] [name]           Date of Birth: 1950/09/27           MRN: [id number] Visit Date: 02/20/2021              Requested by: Binnie Rail, MD [place],  [place] [address] PCP: Binnie Rail, MD   Assessment & Plan: Visit Diagnoses:  1. Left knee pain, unspecified chronicity     Plan: Fortunately his medial lateral compartments of his knee appear well-maintained.  I did recommend a steroid injection in his left knee today and continue quad strengthening exercises.  He agreed with his treatment plan.  I would like to see him back in 2 weeks.  If he continues to have mechanical symptoms on the lateral compartment of his knee, a MRI would be warranted to assess for meniscal tear.  All questions and concerns were answered and addressed.  Follow-Up Instructions: Return in about 2 weeks (around 03/06/2021).   Orders:  Orders Placed This Encounter  Procedures   Large Joint Inj   XR Knee 1-2 Views Left   No orders of the defined types were placed in this encounter.     Procedures: Large Joint Inj: L knee on 02/20/2021 1:26 PM Indications: diagnostic evaluation and pain Details: 22 G 1.5 in needle, superolateral approach  Arthrogram: No  Medications: 3 mL lidocaine 1 %; 40 mg methylPREDNISolone acetate 40 MG/ML Outcome: tolerated well, no immediate complications Procedure, treatment alternatives, risks and benefits explained, specific risks discussed. Consent was given by the patient. Immediately prior to procedure a time out was called to verify the correct patient, procedure, equipment, support staff and site/side marked as required. Patient was prepped and draped in the usual sterile fashion.      Clinical Data: No additional findings.   Subjective: Chief Complaint  Patient presents with   Left Knee - Pain  Patient is a pleasant and active 70 year old gentleman who comes in with a 2 and half week history of knee pain.  He  has had chronic knee pain on and off for years but about 2 and half weeks ago felt a stabbing type of pain on the lateral aspect of his left knee is where he points to.  Is never had knee surgery and never had any type of injection.  He feels like there may have been a little bit of swelling.  He does go to the gym and works out.  Is been working on Forensic scientist exercises.  He is otherwise healthy 70 year old gentleman and is not obese.  He is not a diabetic either.  HPI  Review of Systems There is currently listed no headache, chest pain, shortness of breath, fever, chills, nausea, vomiting  Objective: Vital Signs: Ht 6\' 4"  (1.93 m)   Wt 215 lb (97.5 kg)   BMI 26.17 kg/m   Physical Exam He is alert and orient x3 and in no acute distress Ortho Exam Examination of his left knee shows no significant effusion.  He does have pain with rotating the tibia on the femur on the lateral compartment of the knee and some lateral joint line tenderness.  His knee feels ligamentously stable with excellent range of motion.  There is slight patellofemoral crepitation. Specialty Comments:  No specialty comments available.  Imaging: XR Knee 1-2 Views Left  Result Date: 02/20/2021 2 views of the left knee show no acute findings.  The  medial lateral compartments are well-maintained.  There is moderate patellofemoral arthritic changes.  Alignment is neutral.    PMFS History: Patient Active Problem List   Diagnosis Date Noted   Ingrown toenail of right foot 03/23/2019   Current use of long term anticoagulation 01/20/2018   EtOH dependence ([place]) 07/30/2017   Paroxysmal atrial fibrillation ([place]) 07/29/2017   Varicose veins of left lower extremity with complications 19/50/9326   Greater trochanteric bursitis of right hip 01/02/2017   Prediabetes 09/20/2016   Rotator cuff syndrome of right shoulder 09/27/2015   Vitamin D deficiency 71/24/5809   Lichenoid keratosis 98/33/8250   Superficial  thrombosis of leg 03/28/2013   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   Inguinal hernia recurrent unilateral 08/10/2009   History of colonic polyps 05/15/2008   Essential hypertension 05/15/2008   HYPERLIPIDEMIA 05/12/2007   Elevated prostate specific antigen (PSA) 05/12/2007   Past Medical History:  Diagnosis Date   Atrial fibrillation with RVR ([place])    Converted to NSR with lopressor   Benign prostatic hypertrophy with elevated PSA    Dr Alinda Money, High grade PIN w/o malignancy   History of colonic polyps 2003, 2007   hyperplastic   Hx of colonoscopy 2012   neg   Hyperlipidemia    Hypertension    Retinal vascular occlusion    Dr Delman Cheadle    Family History  Problem Relation Age of Onset   Alzheimer's disease Mother    Heart attack Father 52   Hypertension Brother    Hypertension Maternal Grandmother    Diabetes Neg Hx    Stroke Neg Hx     Past Surgical History:  Procedure Laterality Date   APPENDECTOMY     COLONOSCOPY W/ POLYPECTOMY      X2 , Dr Deatra Ina   ENDOVENOUS ABLATION SAPHENOUS VEIN W/ LASER Left 09/29/2017   endovenous laser ablation left greater saphenous vein by Tinnie Gens MD    HERNIA REPAIR  [id number]    Dr Margot Chimes   lichenoid keratosis resected  8/14   LIPOMA EXCISION     X 5   PROSTATE BIOPSY  09/2010   Dr Alinda Money   stab phlebectomy  Left 01/27/2018   stab phlebectomy 10-20 incisions left leg by Ruta Hinds MD   Social History   Occupational History   Not on file  Tobacco Use   Smoking status: Never   Smokeless tobacco: Never  Vaping Use   Vaping Use: Never used  Substance and Sexual Activity   Alcohol use: Yes    Alcohol/week: 3.0 standard drinks    Types: 3 Glasses of wine Bode week    Comment:  4 glasses of wine/day in the past, < 1/day now   Drug use: No   Sexual activity: Not on file

## 2021-02-25 ENCOUNTER — Encounter: Payer: Self-pay | Admitting: Gastroenterology

## 2021-03-06 ENCOUNTER — Ambulatory Visit: Payer: PPO | Admitting: Orthopaedic Surgery

## 2021-03-12 ENCOUNTER — Ambulatory Visit: Payer: PPO | Admitting: Gastroenterology

## 2021-03-17 ENCOUNTER — Encounter: Payer: Self-pay | Admitting: Internal Medicine

## 2021-03-17 DIAGNOSIS — R7303 Prediabetes: Secondary | ICD-10-CM

## 2021-03-17 DIAGNOSIS — I10 Essential (primary) hypertension: Secondary | ICD-10-CM

## 2021-03-17 DIAGNOSIS — E782 Mixed hyperlipidemia: Secondary | ICD-10-CM

## 2021-03-17 DIAGNOSIS — I480 Paroxysmal atrial fibrillation: Secondary | ICD-10-CM

## 2021-03-17 DIAGNOSIS — E559 Vitamin D deficiency, unspecified: Secondary | ICD-10-CM

## 2021-03-19 ENCOUNTER — Encounter: Payer: Self-pay | Admitting: Gastroenterology

## 2021-03-19 ENCOUNTER — Ambulatory Visit (INDEPENDENT_AMBULATORY_CARE_PROVIDER_SITE_OTHER): Payer: PPO | Admitting: Gastroenterology

## 2021-03-19 ENCOUNTER — Other Ambulatory Visit: Payer: Self-pay

## 2021-03-19 ENCOUNTER — Telehealth: Payer: Self-pay | Admitting: General Surgery

## 2021-03-19 VITALS — BP 138/86 | HR 65 | Ht 76.0 in | Wt 224.0 lb

## 2021-03-19 DIAGNOSIS — Z86010 Personal history of colonic polyps: Secondary | ICD-10-CM | POA: Diagnosis not present

## 2021-03-19 DIAGNOSIS — Z7901 Long term (current) use of anticoagulants: Secondary | ICD-10-CM | POA: Diagnosis not present

## 2021-03-19 DIAGNOSIS — I480 Paroxysmal atrial fibrillation: Secondary | ICD-10-CM

## 2021-03-19 MED ORDER — CLENPIQ 10-3.5-12 MG-GM -GM/160ML PO SOLN: 1.0000 | ORAL | 0 refills | Status: DC

## 2021-03-19 NOTE — Patient Instructions (Signed)
If you are age 72 or older, your body mass index should be between 23-30. Your Body mass index is 27.27 kg/m. If this is out of the aforementioned range listed, please consider follow up with your Primary Care Provider.  If you are age 102 or younger, your body mass index should be between 19-25. Your Body mass index is 27.27 kg/m. If this is out of the aformentioned range listed, please consider follow up with your Primary Care Provider.   __________________________________________________________  The [facility] providers would like to encourage you to use [facility] to communicate with providers for non-urgent requests or questions.  Due to long hold times on the telephone, sending your provider a message by [facility] may be a faster and more efficient way to get a response.  Please allow 48 business hours for a response.  Please remember that this is for non-urgent requests.    You will be contaced by our office prior to your procedure for directions on holding your Eliquis.  If you do not hear from our office 1 week prior to your scheduled procedure, please call [phone] to discuss.  We have sent the following medications to your pharmacy for you to pick up at your convenience:   Clenpiq for colonoscopy  Due to recent changes in healthcare laws, you may see the results of your imaging and laboratory studies on MyChart before your provider has had a chance to review them.  We understand that in some cases there may be results that are confusing or concerning to you. Not all laboratory results come back in the same time frame and the provider may be waiting for multiple results in order to interpret others.  Please give [place] 48 hours in order for your provider to thoroughly review all the results before contacting the office for clarification of your results.   Thank you for choosing me and [facility].  Vito Cirigliano, D.O.

## 2021-03-19 NOTE — Telephone Encounter (Signed)
[facility] Pre-operative Risk Assessment     Request for surgical clearance:     Endoscopy Procedure  What type of surgery is being performed?     Colonoscopy  When is this surgery scheduled?     05/15/2021  What type of clearance is required ?   Pharmacy  Are there any medications that need to be held prior to surgery and how long? Eliquis for 2 days  Practice name and name of physician performing surgery?      [facility]  What is your office phone and fax number?      Phone- [phone]  [phone]  Anesthesia type (None, local, MAC, general) ?       MAC

## 2021-03-19 NOTE — Progress Notes (Signed)
Chief Complaint: Colon cancer screening  HPI:     [name] [name] is a 70 y.o. male with a history of A. fib (on Eliquis), BPH, HTN, HLD, history of colon polyps, referred to the [facility] to discuss repeat colonoscopy for ongoing polyp surveillance and management of anticoagulation in the perioperative period.  He is otherwise without active GI symptoms.  He wishes to continue with colon cancer screening/polyp surveillance.  Endoscopic History: - Colonoscopy (12/2001): 4 mm sigmoid polyp (path: HP), inverted appendiceal stump - Colonoscopy (06/2005): 2 cecal polyps (path: Tubular adenomas), internal hemorrhoids.  Repeat in 7 years - Colonoscopy (06/2010): 1 cm cecal lipoma, sigmoid diverticulosis.  Repeat in 10 years   Past Medical History:  Diagnosis Date   Atrial fibrillation with RVR ([place])    Converted to NSR with lopressor   Benign prostatic hypertrophy with elevated PSA    Dr Alinda Money, High grade PIN w/o malignancy   History of colonic polyps 2003, 2007   hyperplastic   Hx of colonoscopy 2012   neg   Hyperlipidemia    Hypertension    Retinal vascular occlusion    Dr Delman Cheadle     Past Surgical History:  Procedure Laterality Date   APPENDECTOMY     COLONOSCOPY W/ POLYPECTOMY      X2 , Dr Deatra Ina   ENDOVENOUS ABLATION SAPHENOUS VEIN W/ LASER Left 09/29/2017   endovenous laser ablation left greater saphenous vein by Tinnie Gens MD    HERNIA REPAIR  [id number]    Dr Margot Chimes   lichenoid keratosis resected  8/14   LIPOMA EXCISION     X 5   PROSTATE BIOPSY  09/2010   Dr Alinda Money   stab phlebectomy  Left 01/27/2018   stab phlebectomy 10-20 incisions left leg by Ruta Hinds MD   Family History  Problem Relation Age of Onset   Alzheimer's disease Mother    Heart attack Father 16   Hypertension Brother    Hypertension Maternal Grandmother    Diabetes Neg Hx    Stroke Neg Hx    Colon cancer Neg Hx    Esophageal cancer Neg Hx    Pancreatic cancer  Neg Hx    Rectal cancer Neg Hx    Stomach cancer Neg Hx    Social History   Tobacco Use   Smoking status: Never   Smokeless tobacco: Never  Vaping Use   Vaping Use: Never used  Substance Use Topics   Alcohol use: Yes    Alcohol/week: 3.0 standard drinks    Types: 3 Glasses of wine Shahir week    Comment: 2 to 3 drinks   Drug use: No   Current Outpatient Medications  Medication Sig Dispense Refill   apixaban (ELIQUIS) 5 MG TABS tablet Take 1 tablet by mouth twice daily 180 tablet 1   Cholecalciferol (VITAMIN D) 50 MCG (2000 UT) CAPS      irbesartan (AVAPRO) 75 MG tablet Take 1 tablet by mouth once daily 90 tablet 1   magnesium 30 MG tablet Take 30 mg by mouth 2 (two) times daily.     metoprolol succinate (TOPROL-XL) 25 MG 24 hr tablet Take 1 tablet by mouth once daily 90 tablet 1   No current facility-administered medications for this visit.   No Known Allergies   Review of Systems: All systems reviewed and negative except where noted in HPI.     Physical Exam:  Wt Readings from Last 3 Encounters:  03/19/21 224 lb (101.6 kg)  02/20/21 215 lb (97.5 kg)  08/09/20 221 lb 3.2 oz (100.3 kg)    BP 138/86   Pulse 65   Ht 6\' 4"  (1.93 m)   Wt 224 lb (101.6 kg)   SpO2 98%   BMI 27.27 kg/m  Constitutional:  Pleasant, in no acute distress. Psychiatric: Normal mood and affect. Behavior is normal. Cardiovascular: Normal rate, regular rhythm. No edema Pulmonary/chest: Effort normal and breath sounds normal. No wheezing, rales or rhonchi. Abdominal: Soft, nondistended, nontender. Bowel sounds active throughout. There are no masses palpable. No hepatomegaly. Neurological: Alert and oriented to person place and time. Skin: Skin is warm and dry. No rashes noted.   ASSESSMENT AND PLAN;   1) History of colon polyps - Schedule colonoscopy for ongoing polyp surveillance  2) Paroxysmal atrial fibrillation 3) Chronic anticoagulation -Hold Eliquis 2 days before procedure - will  instruct when and how to resume after procedure. Low but real risk of cardiovascular event such as heart attack, stroke, embolism, thrombosis or ischemia/infarct of other organs off Eliquis explained and need to seek urgent help if this occurs. The patient consents to proceed. Will communicate by phone or EMR with patient's prescribing provider to confirm that holding Eliquis is reasonable in this case   The indications, risks, and benefits of colonoscopy were explained to the patient in detail. Risks include but are not limited to bleeding, perforation, adverse reaction to medications, and cardiopulmonary compromise. Sequelae include but are not limited to the possibility of surgery, hospitalization, and mortality. The patient verbalized understanding and wished to proceed. All questions answered, referred to the scheduler and bowel prep ordered. Further recommendations pending results of the exam.     Lavena Bullion, DO, FACG  03/19/2021, 3:02 PM   Burns, Claudina Lick, MD

## 2021-03-25 ENCOUNTER — Other Ambulatory Visit (INDEPENDENT_AMBULATORY_CARE_PROVIDER_SITE_OTHER): Payer: PPO

## 2021-03-25 DIAGNOSIS — E559 Vitamin D deficiency, unspecified: Secondary | ICD-10-CM

## 2021-03-25 DIAGNOSIS — E782 Mixed hyperlipidemia: Secondary | ICD-10-CM | POA: Diagnosis not present

## 2021-03-25 DIAGNOSIS — I10 Essential (primary) hypertension: Secondary | ICD-10-CM | POA: Diagnosis not present

## 2021-03-25 DIAGNOSIS — R7303 Prediabetes: Secondary | ICD-10-CM | POA: Diagnosis not present

## 2021-03-25 DIAGNOSIS — I480 Paroxysmal atrial fibrillation: Secondary | ICD-10-CM

## 2021-03-25 LAB — HEMOGLOBIN A1C: Hgb A1c MFr Bld: 5.8 % (ref 4.6–6.5)

## 2021-03-25 LAB — LIPID PANEL
Cholesterol: 182 mg/dL (ref 0–200)
HDL: 76.6 mg/dL (ref >39.00)
LDL Cholesterol: 95 mg/dL (ref 0–99)
NonHDL: 105.35
Total CHOL/HDL Ratio: 2
Triglycerides: 52 mg/dL (ref 0.0–149.0)
VLDL: 10.4 mg/dL (ref 0.0–40.0)

## 2021-03-25 LAB — COMPREHENSIVE METABOLIC PANEL
ALT: 19 U/L (ref 0–53)
AST: 18 U/L (ref 0–37)
Albumin: 4.3 g/dL (ref 3.5–5.2)
Alkaline Phosphatase: 55 U/L (ref 39–117)
BUN: 23 mg/dL (ref 6–23)
CO2: 27 mEq/L (ref 19–32)
Calcium: 8.9 mg/dL (ref 8.4–10.5)
Chloride: 105 mEq/L (ref 96–112)
Creatinine, Ser: 0.88 mg/dL (ref 0.40–1.50)
GFR: 85.89 mL/min (ref >60.00)
Glucose, Bld: 108 mg/dL — ABNORMAL HIGH (ref 70–99)
Potassium: 4.7 mEq/L (ref 3.5–5.1)
Sodium: 139 mEq/L (ref 135–145)
Total Bilirubin: 0.6 mg/dL (ref 0.2–1.2)
Total Protein: 6.4 g/dL (ref 6.0–8.3)

## 2021-03-25 LAB — CBC WITH DIFFERENTIAL/PLATELET
Basophils Absolute: 0 10*3/uL (ref 0.0–0.1)
Basophils Relative: 0.6 % (ref 0.0–3.0)
Eosinophils Absolute: 0 10*3/uL (ref 0.0–0.7)
Eosinophils Relative: 0.6 % (ref 0.0–5.0)
HCT: 40.7 % (ref 39.0–52.0)
Hemoglobin: 13.6 g/dL (ref 13.0–17.0)
Lymphocytes Relative: 35.9 % (ref 12.0–46.0)
Lymphs Abs: 2.3 10*3/uL (ref 0.7–4.0)
MCHC: 33.3 g/dL (ref 30.0–36.0)
MCV: 91.9 fl (ref 78.0–100.0)
Monocytes Absolute: 0.5 10*3/uL (ref 0.1–1.0)
Monocytes Relative: 8 % (ref 3.0–12.0)
Neutro Abs: 3.5 10*3/uL (ref 1.4–7.7)
Neutrophils Relative %: 54.9 % (ref 43.0–77.0)
Platelets: 201 10*3/uL (ref 150.0–400.0)
RBC: 4.43 Mil/uL (ref 4.22–5.81)
RDW: 13.9 % (ref 11.5–15.5)
WBC: 6.4 10*3/uL (ref 4.0–10.5)

## 2021-03-25 LAB — TSH: TSH: 1.44 u[IU]/mL (ref 0.35–5.50)

## 2021-03-25 LAB — VITAMIN D 25 HYDROXY (VIT D DEFICIENCY, FRACTURES): VITD: 61.18 ng/mL (ref 30.00–100.00)

## 2021-03-25 NOTE — Telephone Encounter (Signed)
Awaiting review by clinical pharmacist

## 2021-03-26 NOTE — Telephone Encounter (Signed)
   Name: [name] [name]  DOB: 06/06/1950  MRN: [id number]   Primary Cardiologist: Sanda Klein, MD  Chart reviewed as part of pre-operative protocol coverage. Patient was contacted 03/26/2021 in reference to pre-operative risk assessment for pending surgery as outlined below.  [name] [name] was last seen on 05/29/2020 by Dr. Sallyanne Kuster.  Since that day, [name] [name] has done well without chest pain or worsening dyspnea.  Therefore, based on ACC/AHA guidelines, the patient would be at acceptable risk for the planned procedure without further cardiovascular testing.   Patient may hold Eliquis for 2 days prior to the procedure and restart as soon as possible afterward at the surgeon's discretion.  The patient was advised that if he develops new symptoms prior to surgery to contact our office to arrange for a follow-up visit, and he verbalized understanding.  I will route this recommendation to the requesting party via Epic fax function and remove from pre-op pool. Please call with questions.  Almyra Deforest, [place] 03/26/2021, 9:58 AM

## 2021-03-26 NOTE — Telephone Encounter (Signed)
Left the patient a detailed message to stop his Eliquis 2 days prior to his procedure.

## 2021-03-26 NOTE — Telephone Encounter (Signed)
Patient with diagnosis of atrial fibrillation on Eliquis for anticoagulation.    Procedure: colonoscopy Date of procedure: 05/15/21   CHA2DS2-VASc Score = 2   This indicates a 2.2% annual risk of stroke. The patient's score is based upon: CHF History: 0 HTN History: 1 Diabetes History: 0 Stroke History: 0 Vascular Disease History: 0 Age Score: 1 Gender Score: 0   CrCl 109 Platelet count 201  Paolo office protocol, patient can hold Eliquis for 2 days prior to procedure.   Patient will not need bridging with Lovenox (enoxaparin) around procedure.

## 2021-03-28 ENCOUNTER — Encounter: Payer: Self-pay | Admitting: Internal Medicine

## 2021-03-28 DIAGNOSIS — D6869 Other thrombophilia: Secondary | ICD-10-CM | POA: Insufficient documentation

## 2021-03-28 NOTE — Patient Instructions (Addendum)
Medications changes include :   None    Please followup in 1 year   Health Maintenance, Male Adopting a healthy lifestyle and getting preventive care are important in promoting health and wellness. Ask your health care provider about: The right schedule for you to have regular tests and exams. Things you can do on your own to prevent diseases and keep yourself healthy. What should I know about diet, weight, and exercise? Eat a healthy diet  Eat a diet that includes plenty of vegetables, fruits, low-fat dairy products, and lean protein. Do not eat a lot of foods that are high in solid fats, added sugars, or sodium. Maintain a healthy weight Body mass index (BMI) is a measurement that can be used to identify possible weight problems. It estimates body fat based on height and weight. Your health care provider can help determine your BMI and help you achieve or maintain a healthy weight. Get regular exercise Get regular exercise. This is one of the most important things you can do for your health. Most adults should: Exercise for at least 150 minutes each week. The exercise should increase your heart rate and make you sweat (moderate-intensity exercise). Do strengthening exercises at least twice a week. This is in addition to the moderate-intensity exercise. Spend less time sitting. Even light physical activity can be beneficial. Watch cholesterol and blood lipids Have your blood tested for lipids and cholesterol at 72 years of age, then have this test every 5 years. You may need to have your cholesterol levels checked more often if: Your lipid or cholesterol levels are high. You are older than 72 years of age. You are at high risk for heart disease. What should I know about cancer screening? Many types of cancers can be detected early and may often be prevented. Depending on your health history and family history, you may need to have cancer screening at various ages. This may  include screening for: Colorectal cancer. Prostate cancer. Skin cancer. Lung cancer. What should I know about heart disease, diabetes, and high blood pressure? Blood pressure and heart disease High blood pressure causes heart disease and increases the risk of stroke. This is more likely to develop in people who have high blood pressure readings or are overweight. Talk with your health care provider about your target blood pressure readings. Have your blood pressure checked: Every 3-5 years if you are 54-14 years of age. Every year if you are 30 years old or older. If you are between the ages of 8 and 31 and are a current or former smoker, ask your health care provider if you should have a one-time screening for abdominal aortic aneurysm (AAA). Diabetes Have regular diabetes screenings. This checks your fasting blood sugar level. Have the screening done: Once every three years after age 48 if you are at a normal weight and have a low risk for diabetes. More often and at a younger age if you are overweight or have a high risk for diabetes. What should I know about preventing infection? Hepatitis B If you have a higher risk for hepatitis B, you should be screened for this virus. Talk with your health care provider to find out if you are at risk for hepatitis B infection. Hepatitis C Blood testing is recommended for: Everyone born from [birth year] through 1967. Anyone with known risk factors for hepatitis C. Sexually transmitted infections (STIs) You should be screened each year for STIs, including gonorrhea and chlamydia, if: You  are sexually active and are younger than 72 years of age. You are older than 72 years of age and your health care provider tells you that you are at risk for this type of infection. Your sexual activity has changed since you were last screened, and you are at increased risk for chlamydia or gonorrhea. Ask your health care provider if you are at risk. Ask your health care  provider about whether you are at high risk for HIV. Your health care provider may recommend a prescription medicine to help prevent HIV infection. If you choose to take medicine to prevent HIV, you should first get tested for HIV. You should then be tested every 3 months for as long as you are taking the medicine. Follow these instructions at home: Alcohol use Do not drink alcohol if your health care provider tells you not to drink. If you drink alcohol: Limit how much you have to 0-2 drinks a day. Know how much alcohol is in your drink. In the [place] one drink equals one 12 oz bottle of beer (355 mL), one 5 oz glass of wine (148 mL), or one 1 oz glass of hard liquor (44 mL). Lifestyle Do not use any products that contain nicotine or tobacco. These products include cigarettes, chewing tobacco, and vaping devices, such as e-cigarettes. If you need help quitting, ask your health care provider. Do not use street drugs. Do not share needles. Ask your health care provider for help if you need support or information about quitting drugs. General instructions Schedule regular health, dental, and eye exams. Stay current with your vaccines. Tell your health care provider if: You often feel depressed. You have ever been abused or do not feel safe at home. Summary Adopting a healthy lifestyle and getting preventive care are important in promoting health and wellness. Follow your health care provider's instructions about healthy diet, exercising, and getting tested or screened for diseases. Follow your health care provider's instructions on monitoring your cholesterol and blood pressure. This information is not intended to replace advice given to you by your health care provider. Make sure you discuss any questions you have with your health care provider. Document Revised: 09/24/2020 Document Reviewed: 09/24/2020 Elsevier Patient Education  [place].

## 2021-03-28 NOTE — Progress Notes (Signed)
Subjective:    Patient ID: [name] [name], male    DOB: 1951-02-04, 70 y.o.   MRN: [id number]   This visit occurred during the SARS-CoV-2 public health emergency.  Safety protocols were in place, including screening questions prior to the visit, additional usage of staff PPE, and extensive cleaning of exam room while observing appropriate contact time as indicated for disinfecting solutions.   HPI He is here for a physical exam.   He had blood work done prior to the appointment.  He does have his colonoscopy scheduled.  Medications and allergies reviewed with patient and updated if appropriate.  Patient Active Problem List   Diagnosis Date Noted   Secondary hypercoagulable state ([place]) 03/28/2021   Ingrown toenail of right foot 03/23/2019   Current use of long term anticoagulation 01/20/2018   EtOH dependence ([place]) 07/30/2017   Paroxysmal atrial fibrillation ([place]) 07/29/2017   Varicose veins of left lower extremity with complications 14/02/3012   Greater trochanteric bursitis of right hip 01/02/2017   Prediabetes 09/20/2016   Rotator cuff syndrome of right shoulder 09/27/2015   Vitamin D deficiency 14/38/8875   Lichenoid keratosis 79/72/8206   Superficial thrombosis of leg 03/28/2013   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   Inguinal hernia recurrent unilateral 08/10/2009   History of colonic polyps 05/15/2008   Essential hypertension 05/15/2008   HYPERLIPIDEMIA 05/12/2007   Elevated prostate specific antigen (PSA) 05/12/2007    Current Outpatient Medications on File Prior to Visit  Medication Sig Dispense Refill   apixaban (ELIQUIS) 5 MG TABS tablet Take 1 tablet by mouth twice daily 180 tablet 1   Cholecalciferol (VITAMIN D) 50 MCG (2000 UT) CAPS      irbesartan (AVAPRO) 75 MG tablet Take 1 tablet by mouth once daily 90 tablet 1   magnesium 30 MG tablet Take 30 mg by mouth 2 (two) times daily.     metoprolol succinate (TOPROL-XL) 25 MG 24 hr tablet Take 1 tablet by mouth once  daily 90 tablet 1   Sod Picosulfate-Mag Ox-Cit Acd (CLENPIQ) 10-3.5-12 MG-GM -GM/160ML SOLN Take 1 kit by mouth as directed. 320 mL 0   No current facility-administered medications on file prior to visit.    Past Medical History:  Diagnosis Date   Atrial fibrillation with RVR ([place])    Converted to NSR with lopressor   Benign prostatic hypertrophy with elevated PSA    Dr Alinda Money, High grade PIN w/o malignancy   History of colonic polyps 2003, 2007   hyperplastic   Hx of colonoscopy 2012   neg   Hyperlipidemia    Hypertension    Retinal vascular occlusion    Dr Delman Cheadle    Past Surgical History:  Procedure Laterality Date   APPENDECTOMY     COLONOSCOPY W/ POLYPECTOMY      X2 , Dr Deatra Ina   ENDOVENOUS ABLATION SAPHENOUS VEIN W/ LASER Left 09/29/2017   endovenous laser ablation left greater saphenous vein by Tinnie Gens MD    HERNIA REPAIR  [id number]    Dr Margot Chimes   lichenoid keratosis resected  8/14   LIPOMA EXCISION     X 5   PROSTATE BIOPSY  09/2010   Dr Alinda Money   stab phlebectomy  Left 01/27/2018   stab phlebectomy 10-20 incisions left leg by Ruta Hinds MD    Social History   Socioeconomic History   Marital status: Married    Spouse name: Not on file   Number of children: 3   Years of education: Not on  file   Highest education level: Not on file  Occupational History   Not on file  Tobacco Use   Smoking status: Never   Smokeless tobacco: Never  Vaping Use   Vaping Use: Never used  Substance and Sexual Activity   Alcohol use: Yes    Alcohol/week: 3.0 standard drinks    Types: 3 Glasses of wine [place]rie week    Comment: 2 to 3 drinks   Drug use: No   Sexual activity: Not on file  Other Topics Concern   Not on file  Social History Narrative   Not on file   Social Determinants of Health   Financial Resource Strain: Low Risk    Difficulty of Paying Living Expenses: Not hard at all  Food Insecurity: No Food Insecurity   Worried About Charity fundraiser in  the Last Year: Never true   Ran Out of Food in the Last Year: Never true  Transportation Needs: No Transportation Needs   Lack of Transportation (Medical): No   Lack of Transportation (Non-Medical): No  Physical Activity: Sufficiently Active   Days of Exercise Joffrey Week: 5 days   Minutes of Exercise Mahad Session: 30 min  Stress: No Stress Concern Present   Feeling of Stress : Not at all  Social Connections: Socially Integrated   Frequency of Communication with Friends and Family: More than three times a week   Frequency of Social Gatherings with Friends and Family: Once a week   Attends Religious Services: More than 4 times [place] year   Active Member of [employer] or Organizations: Yes   Attends Music therapist: More than 4 times [place] year   Marital Status: Married    Family History  Problem Relation Age of Onset   Alzheimer's disease Mother    Heart attack Father 66   Hypertension Brother    Hypertension Maternal Grandmother    Diabetes Neg Hx    Stroke Neg Hx    Colon cancer Neg Hx    Esophageal cancer Neg Hx    Pancreatic cancer Neg Hx    Rectal cancer Neg Hx    Stomach cancer Neg Hx     Review of Systems  Constitutional:  Negative for chills and fever.  Eyes:  Negative for visual disturbance.  Respiratory:  Negative for cough, shortness of breath and wheezing.   Cardiovascular:  Negative for chest pain, palpitations and leg swelling.  Gastrointestinal:  Negative for abdominal pain, blood in stool, constipation, diarrhea and nausea.       No gerd  Genitourinary:  Negative for difficulty urinating, dysuria and hematuria.  Musculoskeletal:  Negative for arthralgias and back pain.  Skin:  Negative for color change and rash.  Neurological:  Negative for light-headedness and headaches.  Psychiatric/Behavioral:  Negative for dysphoric mood. The patient is not nervous/anxious.       Objective:   Vitals:   03/29/21 1017  BP: 140/72  Pulse: 61  Temp: 99 F (37.2  C)  SpO2: 99%   Filed Weights   03/29/21 1017  Weight: 220 lb (99.8 kg)   Body mass index is 27.5 kg/m.  BP Readings from Last 3 Encounters:  03/29/21 140/72  03/19/21 138/86  08/09/20 (!) 140/98    Wt Readings from Last 3 Encounters:  03/29/21 220 lb (99.8 kg)  03/19/21 224 lb (101.6 kg)  02/20/21 215 lb (97.5 kg)     Physical Exam Constitutional: He appears well-developed and well-nourished. No distress.  HENT:  Head:  Normocephalic and atraumatic.  Right Ear: External ear normal.  Left Ear: External ear normal.  Mouth/Throat: Oropharynx is clear and moist.  Normal ear canals and TM b/l  Eyes: Conjunctivae and EOM are normal.  Neck: Neck supple. No tracheal deviation present. No thyromegaly present.  No carotid bruit  Cardiovascular: Normal rate, regular rhythm, normal heart sounds and intact distal pulses.   No murmur heard. Pulmonary/Chest: Effort normal and breath sounds normal. No respiratory distress. He has no wheezes. He has no rales.  Abdominal: Soft. He exhibits no distension. There is no tenderness.  Genitourinary: deferred  Musculoskeletal: He exhibits no edema.  Lymphadenopathy:   He has no cervical adenopathy.  Skin: Skin is warm and dry. He is not diaphoretic.  Psychiatric: He has a normal mood and affect. His behavior is normal.         Assessment & Plan:   Physical exam: Screening blood work  ordered Exercise   some - could do more Weight  ok for age Substance abuse   none   Reviewed recommended immunizations.  He is scheduled for a colonoscopy   Health Maintenance  Topic Date Due   COLONOSCOPY (Pts 45-92yr Insurance coverage will need to be confirmed)  07/11/2020   COVID-19 Vaccine (5 - Booster for Pfizer series) 11/23/2020   TETANUS/TDAP  10/01/2021   Pneumonia Vaccine 72 Years old  Completed   INFLUENZA VACCINE  Completed   Hepatitis C Screening  Completed   Zoster Vaccines- Shingrix  Completed   HPV VACCINES  Aged Out      See Problem List for Assessment and Plan of chronic medical problems.

## 2021-03-29 ENCOUNTER — Ambulatory Visit (INDEPENDENT_AMBULATORY_CARE_PROVIDER_SITE_OTHER): Payer: PPO | Admitting: Internal Medicine

## 2021-03-29 ENCOUNTER — Other Ambulatory Visit: Payer: Self-pay

## 2021-03-29 VITALS — BP 140/72 | HR 61 | Temp 99.0°F | Ht 75.0 in | Wt 220.0 lb

## 2021-03-29 DIAGNOSIS — I480 Paroxysmal atrial fibrillation: Secondary | ICD-10-CM | POA: Diagnosis not present

## 2021-03-29 DIAGNOSIS — Z0000 Encounter for general adult medical examination without abnormal findings: Secondary | ICD-10-CM | POA: Diagnosis not present

## 2021-03-29 DIAGNOSIS — E559 Vitamin D deficiency, unspecified: Secondary | ICD-10-CM | POA: Diagnosis not present

## 2021-03-29 DIAGNOSIS — I10 Essential (primary) hypertension: Secondary | ICD-10-CM

## 2021-03-29 DIAGNOSIS — D6869 Other thrombophilia: Secondary | ICD-10-CM | POA: Diagnosis not present

## 2021-03-29 DIAGNOSIS — R7303 Prediabetes: Secondary | ICD-10-CM | POA: Diagnosis not present

## 2021-03-29 DIAGNOSIS — E782 Mixed hyperlipidemia: Secondary | ICD-10-CM | POA: Diagnosis not present

## 2021-03-29 NOTE — Assessment & Plan Note (Addendum)
Chronic Regular exercise and healthy diet encouraged LDL less than 100 Continue lifestyle management

## 2021-03-29 NOTE — Assessment & Plan Note (Signed)
Chronic Continue Eliquis 5 mg twice daily

## 2021-03-29 NOTE — Assessment & Plan Note (Signed)
Chronic Lab Results  Component Value Date   HGBA1C 5.8 03/25/2021   Sugars in prediabetic range Encouraged diabetic diet, regular exercise

## 2021-03-29 NOTE — Assessment & Plan Note (Addendum)
Chronic Following with cardiology On metoprolol XL 25 mg daily, Eliquis 5 mg twice daily CBC, CMP, TSH done prior to this visit and reviewed

## 2021-03-29 NOTE — Assessment & Plan Note (Signed)
Chronic Continue vitamin D daily supplementation

## 2021-03-29 NOTE — Assessment & Plan Note (Addendum)
Chronic Blood pressure well controlled CMP normal Continue metoprolol XL 25 mg daily, irbesartan 75 mg daily

## 2021-04-15 ENCOUNTER — Encounter: Payer: Self-pay | Admitting: Internal Medicine

## 2021-04-24 ENCOUNTER — Encounter (HOSPITAL_BASED_OUTPATIENT_CLINIC_OR_DEPARTMENT_OTHER): Payer: Self-pay

## 2021-04-24 ENCOUNTER — Emergency Department (HOSPITAL_BASED_OUTPATIENT_CLINIC_OR_DEPARTMENT_OTHER)
Admission: EM | Admit: 2021-04-24 | Discharge: 2021-04-24 | Disposition: A | Discharge status: Home / Self Care | Payer: PPO | Attending: Emergency Medicine | Admitting: Emergency Medicine

## 2021-04-24 ENCOUNTER — Other Ambulatory Visit: Payer: Self-pay

## 2021-04-24 DIAGNOSIS — J101 Influenza due to other identified influenza virus with other respiratory manifestations: Secondary | ICD-10-CM

## 2021-04-24 DIAGNOSIS — R509 Fever, unspecified: Secondary | ICD-10-CM | POA: Diagnosis present

## 2021-04-24 DIAGNOSIS — Z20822 Contact with and (suspected) exposure to covid-19: Secondary | ICD-10-CM | POA: Insufficient documentation

## 2021-04-24 DIAGNOSIS — Z7901 Long term (current) use of anticoagulants: Secondary | ICD-10-CM | POA: Insufficient documentation

## 2021-04-24 DIAGNOSIS — I10 Essential (primary) hypertension: Secondary | ICD-10-CM | POA: Insufficient documentation

## 2021-04-24 DIAGNOSIS — I480 Paroxysmal atrial fibrillation: Secondary | ICD-10-CM | POA: Insufficient documentation

## 2021-04-24 DIAGNOSIS — Z79899 Other long term (current) drug therapy: Secondary | ICD-10-CM | POA: Insufficient documentation

## 2021-04-24 LAB — RESP PANEL BY RT-PCR (FLU A&B, COVID) ARPGX2
Influenza A by PCR: POSITIVE — AB
Influenza B by PCR: NEGATIVE
SARS Coronavirus 2 by RT PCR: NEGATIVE

## 2021-04-24 NOTE — ED Triage Notes (Signed)
Pt c/o flu like sx started 11/23-tested +covid 11/26-c/o cont'd sx-NAD-steady gait

## 2021-04-24 NOTE — ED Notes (Signed)
[place] pt was COVID pos 11/26, symptoms resolved from covid, started feeling worse 04/22/2021, has low grade fever at home, cough and body aches.

## 2021-04-24 NOTE — ED Provider Notes (Signed)
[facility] EMERGENCY DEPARTMENT Provider Note   CSN: [id number] Arrival date & time: 04/24/21  1115     History Chief Complaint  Patient presents with   Cough    [name] [name] is a 70 y.o. male who presents for evaluation of low-grade fevers no higher than been 101 F and worsening cough.  Patient has positive for COVID about 10 days prior with very few symptoms at that time, however over the last few days he has been having worsening cough going from dry to a wet/nonproductive to now a more productive cough with white sputum.  He also endorses headache, extreme fatigue and occasional nausea.  He has been taking over-the-counter medications such as Tylenol or Motrin as needed for his symptoms.  He is also taken some Robitussin to help with this cough however he does not necessarily see any provement.  He denies chest pain, shortness of breath, abdominal pain, vomiting, urinary symptoms, body aches and dizziness.   Cough Associated symptoms: fever and headaches   Associated symptoms: no rash and no shortness of breath       Past Medical History:  Diagnosis Date   Atrial fibrillation with RVR ([place])    Converted to NSR with lopressor   Benign prostatic hypertrophy with elevated PSA    Dr Alinda Money, High grade PIN w/o malignancy   History of colonic polyps 2003, 2007   hyperplastic   Hx of colonoscopy 2012   neg   Hyperlipidemia    Hypertension    Retinal vascular occlusion    Dr Delman Cheadle    Patient Active Problem List   Diagnosis Date Noted   Secondary hypercoagulable state ([place]) 03/28/2021   Ingrown toenail of right foot 03/23/2019   Current use of long term anticoagulation 01/20/2018   EtOH dependence ([place]) 07/30/2017   Paroxysmal atrial fibrillation ([place]) 07/29/2017   Varicose veins of left lower extremity with complications 89/84/2103   Greater trochanteric bursitis of right hip 01/02/2017   Prediabetes 09/20/2016   Rotator cuff syndrome of right shoulder  09/27/2015   Vitamin D deficiency 12/81/1886   Lichenoid keratosis 77/37/3668   Superficial thrombosis of leg 03/28/2013   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   Inguinal hernia recurrent unilateral 08/10/2009   History of colonic polyps 05/15/2008   Essential hypertension 05/15/2008   HYPERLIPIDEMIA 05/12/2007   Elevated prostate specific antigen (PSA) 05/12/2007    Past Surgical History:  Procedure Laterality Date   APPENDECTOMY     COLONOSCOPY W/ POLYPECTOMY      X2 , Dr Deatra Ina   ENDOVENOUS ABLATION SAPHENOUS VEIN W/ LASER Left 09/29/2017   endovenous laser ablation left greater saphenous vein by Tinnie Gens MD    HERNIA REPAIR  [id number]    Dr Margot Chimes   lichenoid keratosis resected  8/14   LIPOMA EXCISION     X 5   PROSTATE BIOPSY  09/2010   Dr Alinda Money   stab phlebectomy  Left 01/27/2018   stab phlebectomy 10-20 incisions left leg by Ruta Hinds MD       Family History  Problem Relation Age of Onset   Alzheimer's disease Mother    Heart attack Father 39   Hypertension Brother    Hypertension Maternal Grandmother    Diabetes Neg Hx    Stroke Neg Hx    Colon cancer Neg Hx    Esophageal cancer Neg Hx    Pancreatic cancer Neg Hx    Rectal cancer Neg Hx    Stomach cancer Neg Hx  Social History   Tobacco Use   Smoking status: Never   Smokeless tobacco: Never  Vaping Use   Vaping Use: Never used  Substance Use Topics   Alcohol use: Yes    Alcohol/week: 3.0 standard drinks    Types: 3 Glasses of wine Tamaj week    Comment: daily   Drug use: No    Home Medications Prior to Admission medications   Medication Sig Start Date End Date Taking? Authorizing Provider  apixaban (ELIQUIS) 5 MG TABS tablet Take 1 tablet by mouth twice daily 11/02/20   Croitoru, Mihai, MD  Cholecalciferol (VITAMIN D) 50 MCG (2000 UT) CAPS  01/28/19   [provider]  irbesartan (AVAPRO) 75 MG tablet Take 1 tablet by mouth once daily 11/02/20   Croitoru, Mihai, MD  magnesium 30 MG  tablet Take 30 mg by mouth 2 (two) times daily.    [provider]  metoprolol succinate (TOPROL-XL) 25 MG 24 hr tablet Take 1 tablet by mouth once daily 11/02/20   Croitoru, Mihai, MD  Sod Picosulfate-Mag Ox-Cit Acd (CLENPIQ) 10-3.5-12 MG-GM -GM/160ML SOLN Take 1 kit by mouth as directed. 03/19/21   Cirigliano, Vito V, DO    Allergies    Patient has no known allergies.  Review of Systems   Review of Systems  Constitutional:  Positive for fatigue and fever.  HENT: Negative.    Eyes: Negative.   Respiratory:  Positive for cough. Negative for shortness of breath.   Cardiovascular: Negative.   Gastrointestinal:  Positive for nausea. Negative for abdominal pain and vomiting.  Endocrine: Negative.   Genitourinary: Negative.   Musculoskeletal: Negative.   Skin:  Negative for rash.  Neurological:  Positive for headaches. Negative for dizziness.  All other systems reviewed and are negative.  Physical Exam Updated Vital Signs BP 114/71 (BP Location: Left Arm)   Pulse 81   Temp 98.5 F (36.9 C) (Oral)   Resp 18   Ht _0  (1.905 m)   Wt 99.8 kg   SpO2 99%   BMI 27.50 kg/m   Physical Exam Constitutional:      Appearance: Normal appearance.  HENT:     Head: Atraumatic.     Right Ear: Tympanic membrane normal.     Left Ear: Tympanic membrane normal.     Ears:     Comments: There is moderate cerumen buildup of the right ear canal.  Left ear without wax.  Tympanic membrane intact with visible bony landmarks.  No bulging or erythema.    Nose: Nose normal.     Mouth/Throat:     Mouth: Mucous membranes are moist.     Pharynx: No posterior oropharyngeal erythema.  Eyes:     Conjunctiva/sclera: Conjunctivae normal.     Pupils: Pupils are equal, round, and reactive to light.  Cardiovascular:     Rate and Rhythm: Normal rate and regular rhythm.     Pulses: Normal pulses.     Heart sounds: Normal heart sounds.  Pulmonary:     Effort: Pulmonary effort is normal. Tachypnea  present.     Breath sounds: Normal breath sounds.     Comments: Lung clear to ausculation bilaterally. No tachypnea, no accessory muscle use, no acute distress, no increased work of breathing, no decrease in air movement  Abdominal:     General: Abdomen is flat.     Tenderness: There is no abdominal tenderness.  Musculoskeletal:        General: Normal range of motion.  Skin:  General: Skin is warm.     Capillary Refill: Capillary refill takes less than 2 seconds.  Neurological:     General: No focal deficit present.     Mental Status: He is alert.  Psychiatric:        Mood and Affect: Mood normal.    ED Results / Procedures / Treatments   Labs (all labs ordered are listed, but only abnormal results are displayed) Labs Reviewed  RESP PANEL BY RT-PCR (FLU A&B, COVID) ARPGX2 - Abnormal; Notable for the following components:      Result Value   Influenza A by PCR POSITIVE (*)    All other components within normal limits    EKG None  Radiology No results found.  Procedures Procedures   Medications Ordered in ED Medications - No data to display  ED Course  I have reviewed the triage vital signs and the nursing notes.  Pertinent labs & imaging results that were available during my care of the patient were reviewed by me and considered in my medical decision making (see chart for details).    MDM Rules/Calculators/A&P                         Initial impression: 70 year old presenting with URI type illness as described above. Patient is in no acute distress, resting comfortably on bed in exam room.  Afebrile, vitals otherwise stable. No signs of dehydration, tolerating PO's.   Workup: Flu positive respiratory panel  Rule-out: Rule out pneumonia, patient had clear lung exam with no increased work of breathing. Xray not indicated.  Recent COVID infection, COVID-negative.  Low suspicion for PE given patient's nontachycardic currently on Eliquis  Plan:  Patient will be  discharged with instructions to orally hydrate, rest, and use over-the-counter medications such as anti-inflammatories ibuprofen or Aleve for muscle aches and Tylenol for fever.     Discussed the cost and risk versus benefit of Tamiflu treatment with the patient.  Patient expresses understanding and agrees to plan.   All labs and imaging were independently reviewed and interpreted by myself, Kathe Becton  Final Clinical Impression(s) / ED Diagnoses Final diagnoses:  Influenza A    Rx / DC Orders ED Discharge Orders     None        Rodena Piety 04/24/21 1455    Hayden Rasmussen, MD 04/24/21 1845

## 2021-04-24 NOTE — Discharge Instructions (Signed)
You have the flu this is a viral infection that will likely start to improve after 7-10 days, antibiotics are not helpful in treating viral infections. Since your symptoms have been present for more than 2 days Tamiflu will give no additional benefit.  Please make sure you are drinking plenty of fluids. You can treat your symptoms supportively with tylenol 650 mg/1000mg and ibuprofen 600 mg every 6 hours for fevers and pains. For nasal congestion you can use Zyrtec and Flonase to help with nasal congestion. To treat cough you can use over the counter cough medications such as Mucinex DM or Robitussin and throat lozenges. If your symptoms are not improving please follow up with you Primary doctor.   If you develop persistent fevers, shortness of breath or difficulty breathing, chest pain, severe headache and neck pain, persistent nausea and vomiting or other new or concerning symptoms return to the Emergency department.  

## 2021-04-25 ENCOUNTER — Telehealth: Payer: PPO | Admitting: Nurse Practitioner

## 2021-05-01 ENCOUNTER — Other Ambulatory Visit: Payer: Self-pay | Admitting: Cardiovascular Disease

## 2021-05-03 DIAGNOSIS — R972 Elevated prostate specific antigen [PSA]: Secondary | ICD-10-CM | POA: Diagnosis not present

## 2021-05-07 DIAGNOSIS — Z85828 Personal history of other malignant neoplasm of skin: Secondary | ICD-10-CM | POA: Diagnosis not present

## 2021-05-07 DIAGNOSIS — L821 Other seborrheic keratosis: Secondary | ICD-10-CM | POA: Diagnosis not present

## 2021-05-07 DIAGNOSIS — L820 Inflamed seborrheic keratosis: Secondary | ICD-10-CM | POA: Diagnosis not present

## 2021-05-08 DIAGNOSIS — R972 Elevated prostate specific antigen [PSA]: Secondary | ICD-10-CM | POA: Diagnosis not present

## 2021-05-08 DIAGNOSIS — N401 Enlarged prostate with lower urinary tract symptoms: Secondary | ICD-10-CM | POA: Diagnosis not present

## 2021-05-08 DIAGNOSIS — R3915 Urgency of urination: Secondary | ICD-10-CM | POA: Diagnosis not present

## 2021-05-08 DIAGNOSIS — N5201 Erectile dysfunction due to arterial insufficiency: Secondary | ICD-10-CM | POA: Diagnosis not present

## 2021-05-15 ENCOUNTER — Encounter: Payer: PPO | Admitting: Gastroenterology

## 2021-05-21 DIAGNOSIS — H2513 Age-related nuclear cataract, bilateral: Secondary | ICD-10-CM | POA: Diagnosis not present

## 2021-05-21 DIAGNOSIS — H52203 Unspecified astigmatism, bilateral: Secondary | ICD-10-CM | POA: Diagnosis not present

## 2021-05-28 ENCOUNTER — Encounter: Payer: Self-pay | Admitting: Gastroenterology

## 2021-05-28 ENCOUNTER — Ambulatory Visit (AMBULATORY_SURGERY_CENTER): Payer: PPO | Admitting: Gastroenterology

## 2021-05-28 ENCOUNTER — Other Ambulatory Visit: Payer: Self-pay

## 2021-05-28 VITALS — BP 126/84 | HR 58 | Temp 98.0°F | Resp 15 | Ht 76.0 in | Wt 224.0 lb

## 2021-05-28 DIAGNOSIS — I10 Essential (primary) hypertension: Secondary | ICD-10-CM | POA: Diagnosis not present

## 2021-05-28 DIAGNOSIS — D120 Benign neoplasm of cecum: Secondary | ICD-10-CM | POA: Diagnosis not present

## 2021-05-28 DIAGNOSIS — D124 Benign neoplasm of descending colon: Secondary | ICD-10-CM | POA: Diagnosis not present

## 2021-05-28 DIAGNOSIS — E785 Hyperlipidemia, unspecified: Secondary | ICD-10-CM | POA: Diagnosis not present

## 2021-05-28 DIAGNOSIS — D129 Benign neoplasm of anus and anal canal: Secondary | ICD-10-CM

## 2021-05-28 DIAGNOSIS — Z86010 Personal history of colonic polyps: Secondary | ICD-10-CM | POA: Diagnosis not present

## 2021-05-28 DIAGNOSIS — K640 First degree hemorrhoids: Secondary | ICD-10-CM | POA: Diagnosis not present

## 2021-05-28 DIAGNOSIS — D122 Benign neoplasm of ascending colon: Secondary | ICD-10-CM | POA: Diagnosis not present

## 2021-05-28 DIAGNOSIS — K5730 Diverticulosis of large intestine without perforation or abscess without bleeding: Secondary | ICD-10-CM

## 2021-05-28 DIAGNOSIS — D128 Benign neoplasm of rectum: Secondary | ICD-10-CM

## 2021-05-28 MED ORDER — SODIUM CHLORIDE 0.9 % IV SOLN: 500.0000 mL | Freq: Once | INTRAVENOUS | Status: DC

## 2021-05-28 NOTE — Progress Notes (Signed)
GASTROENTEROLOGY PROCEDURE H&P NOTE   Primary Care Physician: Binnie Rail, MD    Reason for Procedure:  Colon Cancer screening, colon polyp surveillance ongoing colon polyp surveillance  Plan:    Colonoscopy  Patient is appropriate for endoscopic procedure(s) in the ambulatory ([place]) setting.  The nature of the procedure, as well as the risks, benefits, and alternatives were carefully and thoroughly reviewed with the patient. Ample time for discussion and questions allowed. The patient understood, was satisfied, and agreed to proceed.     HPI: [name] [name] is a 71 y.o. male who presents for colonoscopy for routine Colon Cancer screening.  No active GI symptoms.  No known family history of colon cancer or related malignancy.  Patient is otherwise without complaints or active issues today.  Holding Eliquis x2 days in preparation for today's procedure.  Endoscopic History: - Colonoscopy (12/2001): 4 mm sigmoid polyp (path: HP), inverted appendiceal stump - Colonoscopy (06/2005): 2 cecal polyps (path: Tubular adenomas), internal hemorrhoids.  Repeat in 7 years - Colonoscopy (06/2010): 1 cm cecal lipoma, sigmoid diverticulosis.  Repeat in 10 years  Past Medical History:  Diagnosis Date   Atrial fibrillation with RVR ([place])    Converted to NSR with lopressor   Benign prostatic hypertrophy with elevated PSA    Dr Alinda Money, High grade PIN w/o malignancy   History of colonic polyps 2003, 2007   hyperplastic   Hx of colonoscopy 2012   neg   Hyperlipidemia    Hypertension    Retinal vascular occlusion    Dr Delman Cheadle    Past Surgical History:  Procedure Laterality Date   APPENDECTOMY     COLONOSCOPY W/ POLYPECTOMY      X2 , Dr Deatra Ina   ENDOVENOUS ABLATION SAPHENOUS VEIN W/ LASER Left 09/29/2017   endovenous laser ablation left greater saphenous vein by Tinnie Gens MD    HERNIA REPAIR  [id number]    Dr Margot Chimes   lichenoid keratosis resected  8/14   LIPOMA EXCISION     X 5    PROSTATE BIOPSY  09/2010   Dr Alinda Money   stab phlebectomy  Left 01/27/2018   stab phlebectomy 10-20 incisions left leg by Ruta Hinds MD    Prior to Admission medications   Medication Sig Start Date End Date Taking? Authorizing Provider  irbesartan (AVAPRO) 75 MG tablet Take 1 tablet by mouth once daily 05/01/21  Yes Croitoru, Mihai, MD  metoprolol succinate (TOPROL-XL) 25 MG 24 hr tablet Take 1 tablet by mouth once daily 05/01/21  Yes Croitoru, Mihai, MD  apixaban (ELIQUIS) 5 MG TABS tablet Take 1 tablet by mouth twice daily 11/02/20   Croitoru, Mihai, MD  Cholecalciferol (VITAMIN D) 50 MCG (2000 UT) CAPS  01/28/19   [provider]  magnesium 30 MG tablet Take 30 mg by mouth 2 (two) times daily. Patient not taking: Reported on 05/28/2021    [provider]    Current Outpatient Medications  Medication Sig Dispense Refill   irbesartan (AVAPRO) 75 MG tablet Take 1 tablet by mouth once daily 90 tablet 3   metoprolol succinate (TOPROL-XL) 25 MG 24 hr tablet Take 1 tablet by mouth once daily 90 tablet 3   apixaban (ELIQUIS) 5 MG TABS tablet Take 1 tablet by mouth twice daily 180 tablet 1   Cholecalciferol (VITAMIN D) 50 MCG (2000 UT) CAPS  (Patient not taking: Reported on 05/28/2021)     magnesium 30 MG tablet Take 30 mg by mouth 2 (two) times daily. (  Patient not taking: Reported on 05/28/2021)     Current Facility-Administered Medications  Medication Dose Route Frequency Provider Last Rate Last Admin   0.9 %  sodium chloride infusion  500 mL Intravenous Once Kameo Bains V, DO        Allergies as of 05/28/2021   (No Known Allergies)    Family History  Problem Relation Age of Onset   Alzheimer's disease Mother    Heart attack Father 4   Hypertension Brother    Hypertension Maternal Grandmother    Diabetes Neg Hx    Stroke Neg Hx    Colon cancer Neg Hx    Esophageal cancer Neg Hx    Pancreatic cancer Neg Hx    Rectal cancer Neg Hx    Stomach cancer Neg Hx      Social History   Socioeconomic History   Marital status: Married    Spouse name: Not on file   Number of children: 3   Years of education: Not on file   Highest education level: Not on file  Occupational History   Not on file  Tobacco Use   Smoking status: Never   Smokeless tobacco: Never  Vaping Use   Vaping Use: Never used  Substance and Sexual Activity   Alcohol use: Yes    Alcohol/week: 3.0 standard drinks    Types: 3 Glasses of wine [place] week    Comment: daily   Drug use: No   Sexual activity: Not on file  Other Topics Concern   Not on file  Social History Narrative   Not on file   Social Determinants of Health   Financial Resource Strain: Low Risk    Difficulty of Paying Living Expenses: Not hard at all  Food Insecurity: No Food Insecurity   Worried About Charity fundraiser in the Last Year: Never true   [place] in the Last Year: Never true  Transportation Needs: No Transportation Needs   Lack of Transportation (Medical): No   Lack of Transportation (Non-Medical): No  Physical Activity: Sufficiently Active   Days of Exercise [place] Week: 5 days   Minutes of Exercise [place]chel Session: 30 min  Stress: No Stress Concern Present   Feeling of Stress : Not at all  Social Connections: Socially Integrated   Frequency of Communication with Friends and Family: More than three times a week   Frequency of Social Gatherings with Friends and Family: Once a week   Attends Religious Services: More than 4 times [place] year   Active Member of [employer] or Organizations: Yes   Attends Music therapist: More than 4 times [place] year   Marital Status: Married  Human resources officer Violence: Not At Risk   Fear of Current or Ex-Partner: No   Emotionally Abused: No   Physically Abused: No   Sexually Abused: No    Physical Exam: Vital signs in last 24 hours: @BP  (!) 160/78    Pulse 62    Temp 98 F (36.7 C)    Ht 6\' 4"  (1.93 m)    Wt 224 lb (101.6 kg)    SpO2 99%    BMI  27.27 kg/m  GEN: NAD EYE: Sclerae anicteric ENT: MMM CV: Non-tachycardic Pulm: CTA b/l GI: Soft, NT/ND NEURO:  Alert & Oriented x 3   Gerrit Heck, DO [facility]   05/28/2021 8:34 AM

## 2021-05-28 NOTE — Patient Instructions (Signed)
Discharge instructions given. Handouts on polyps,diverticulosis and hemorrhoids. Resume Eliquis at prior dose tomorrow. Resume previous medications. YOU HAD AN ENDOSCOPIC PROCEDURE TODAY AT [facility]:   Refer to the procedure report that was given to you for any specific questions about what was found during the examination.  If the procedure report does not answer your questions, please call your gastroenterologist to clarify.  If you requested that your care partner not be given the details of your procedure findings, then the procedure report has been included in a sealed envelope for you to review at your convenience later.  YOU SHOULD EXPECT: Some feelings of bloating in the abdomen. Passage of more gas than usual.  Walking can help get rid of the air that was put into your GI tract during the procedure and reduce the bloating. If you had a lower endoscopy (such as a colonoscopy or flexible sigmoidoscopy) you may notice spotting of blood in your stool or on the toilet paper. If you underwent a bowel prep for your procedure, you may not have a normal bowel movement for a few days.  Please Note:  You might notice some irritation and congestion in your nose or some drainage.  This is from the oxygen used during your procedure.  There is no need for concern and it should clear up in a day or so.  SYMPTOMS TO REPORT IMMEDIATELY:  Following lower endoscopy (colonoscopy or flexible sigmoidoscopy):  Excessive amounts of blood in the stool  Significant tenderness or worsening of abdominal pains  Swelling of the abdomen that is new, acute  Fever of 100F or higher   For urgent or emergent issues, a gastroenterologist can be reached at any hour by calling [phone]. Do not use MyChart messaging for urgent concerns.    DIET:  We do recommend a small meal at first, but then you may proceed to your regular diet.  Drink plenty of fluids but you should avoid alcoholic beverages  for 24 hours.  ACTIVITY:  You should plan to take it easy for the rest of today and you should NOT DRIVE or use heavy machinery until tomorrow (because of the sedation medicines used during the test).    FOLLOW UP: Our staff will call the number listed on your records 48-72 hours following your procedure to check on you and address any questions or concerns that you may have regarding the information given to you following your procedure. If we do not reach you, we will leave a message.  We will attempt to reach you two times.  During this call, we will ask if you have developed any symptoms of COVID 19. If you develop any symptoms (ie: fever, flu-like symptoms, shortness of breath, cough etc.) before then, please call [phone].  If you test positive for Covid 19 in the 2 weeks post procedure, please call and report this information to [place].    If any biopsies were taken you will be contacted by phone or by letter within the next 1-3 weeks.  Please call us at [phone] if you have not heard about the biopsies in 3 weeks.    SIGNATURES/CONFIDENTIALITY: You and/or your care partner have signed paperwork which will be entered into your electronic medical record.  These signatures attest to the fact that that the information above on your After Visit Summary has been reviewed and is understood.  Full responsibility of the confidentiality of this discharge information lies with you and/or your care-partner.

## 2021-05-28 NOTE — Op Note (Signed)
[facility] Patient Name: [name] [name] Procedure Date: 05/28/2021 8:34 AM MRN: [id number] Endoscopist: Gerrit Heck , MD Age: 71 Referring MD:  Date of Birth: 09/19/1950 Gender: Male Account #: [id number] Procedure:                Colonoscopy Indications:              Screening for colorectal malignant neoplasm (last                            colonoscopy was 10 years ago). Otherwise, no GI                            symptoms.                           Endoscopic History:                           - Colonoscopy (12/2001): 4 mm sigmoid polyp (path:                            HP), inverted appendiceal stump                           - Colonoscopy (06/2005): 2 cecal polyps (path:                            Tubular adenomas), internal hemorrhoids. Repeat in                            7 years                           - Colonoscopy (06/2010): 1 cm cecal lipoma, sigmoid                            diverticulosis. Repeat in 10 years Medicines:                Monitored Anesthesia Care Procedure:                Pre-Anesthesia Assessment:                           - Prior to the procedure, a History and Physical                            was performed, and patient medications and                            allergies were reviewed. The patient's tolerance of                            previous anesthesia was also reviewed. The risks                            and benefits of the procedure and the sedation  options and risks were discussed with the patient.                            All questions were answered, and informed consent                            was obtained. Prior Anticoagulants: The patient has                            taken Eliquis (apixaban), last dose was 2 days                            prior to procedure. ASA Grade Assessment: II - A                            patient with mild systemic disease. After reviewing                             the risks and benefits, the patient was deemed in                            satisfactory condition to undergo the procedure.                           After obtaining informed consent, the colonoscope                            was passed under direct vision. Throughout the                            procedure, the patient's blood pressure, pulse, and                            oxygen saturations were monitored continuously. The                            CF [id number] #[id number] was introduced through the anus                            and advanced to the the cecum, identified by                            appendiceal orifice and ileocecal valve. The                            colonoscopy was performed without difficulty. The                            patient tolerated the procedure well. The quality                            of the bowel preparation was good. The ileocecal  valve, appendiceal orifice, and rectum were                            photographed. Scope In: 8:41:15 AM Scope Out: 9:02:41 AM Scope Withdrawal Time: 0 hours 18 minutes 41 seconds  Total Procedure Duration: 0 hours 21 minutes 26 seconds  Findings:                 The perianal and digital rectal examinations were                            normal.                           Three sessile polyps were found in the descending                            colon, ascending colon and cecum. The polyps were 3                            to 8 mm in size. These polyps were removed with a                            cold snare. Resection and retrieval were complete.                            Estimated blood loss was minimal.                           A 4 mm polyp was found in the rectum. The polyp was                            sessile. The polyp was removed with a cold snare.                            Resection and retrieval were complete. Estimated                            blood loss was minimal.                            Multiple small and large-mouthed diverticula were                            found in the sigmoid colon.                           Non-bleeding internal hemorrhoids were found during                            retroflexion. The hemorrhoids were small.                           The appendiceal stump was inverted. Images were  compared to each of his previous 3 colonoscopies,                            with similar appearance in each. Otherwise normal                            appearing overlying mucosa. Complications:            No immediate complications. Estimated Blood Loss:     Estimated blood loss was minimal. Impression:               - Three 3 to 8 mm polyps in the descending colon,                            in the ascending colon and in the cecum, removed                            with a cold snare. Resected and retrieved.                           - One 4 mm polyp in the rectum, removed with a cold                            snare. Resected and retrieved.                           - Diverticulosis in the sigmoid colon.                           - Non-bleeding internal hemorrhoids.                           - Inverted appendiceal stump. Images were compared                            to each of his previous 3 colonoscopies, with                            similar appearance in each. Recommendation:           - Patient has a contact number available for                            emergencies. The signs and symptoms of potential                            delayed complications were discussed with the                            patient. Return to normal activities tomorrow.                            Written discharge instructions were provided to the  patient.                           - Resume previous diet.                           - Continue present medications.                           - Resume  Eliquis (apixaban) at prior dose tomorrow.                           - Await pathology results.                           - Repeat colonoscopy for surveillance based on                            pathology results.                           - Return to GI clinic PRN.                           - Use fiber, for example Citrucel, Fibercon, Konsyl                            or Metamucil.                           - Internal hemorrhoids were noted on this study and                            may be amenable to hemorrhoid band ligation. If you                            are interested in further treatment of these                            hemorrhoids with band ligation, please contact my                            clinic to set up an appointment for evaluation and                            treatment. Gerrit Heck, MD 05/28/2021 9:11:39 AM

## 2021-05-28 NOTE — Progress Notes (Signed)
Called to room to assist during endoscopic procedure.  Patient ID and intended procedure confirmed with present staff. Received instructions for my participation in the procedure from the performing physician.  

## 2021-05-28 NOTE — Progress Notes (Signed)
A and O x3. Report to RN. Tolerated MAC anesthesia well. 

## 2021-05-29 ENCOUNTER — Other Ambulatory Visit: Payer: Self-pay | Admitting: Cardiovascular Disease

## 2021-05-29 NOTE — Telephone Encounter (Signed)
Prescription refill request for Eliquis received. Indication:Afib Last office visit:upcoming Scr:0.8 Age: 71 Weight:101.6 kg  Prescription refilled

## 2021-05-30 ENCOUNTER — Telehealth: Payer: Self-pay

## 2021-05-30 NOTE — Telephone Encounter (Signed)
°  Follow up Call-  Call back number 05/28/2021  Post procedure Call Back phone  # [phone]  Permission to leave phone message Yes  Some recent data might be hidden     Patient questions:  Do you have a fever, pain , or abdominal swelling? No. Pain Score  0 *  Have you tolerated food without any problems? Yes.    Have you been able to return to your normal activities? Yes.    Do you have any questions about your discharge instructions: Diet   No. Medications  No. Follow up visit  No.  Do you have questions or concerns about your Care? No.  Actions: * If pain score is 4 or above: No action needed, pain <4.  Have you developed a fever since your procedure? no  2.   Have you had an respiratory symptoms (SOB or cough) since your procedure? no  3.   Have you tested positive for COVID 19 since your procedure no  4.   Have you had any family members/close contacts diagnosed with the COVID 19 since your procedure?  no   If yes to any of these questions please route to Joylene John, RN and Joella Prince, RN

## 2021-05-31 ENCOUNTER — Encounter: Payer: Self-pay | Admitting: Gastroenterology

## 2021-06-15 IMAGING — DX DG SHOULDER 2+V*R*
3 series · 3 of 3 positions shown · non-contrast
Comparison: Radiograph 01/02/2017

CLINICAL DATA: Acute on chronic right shoulder pain.

EXAM:
RIGHT SHOULDER - 2+ VIEW

[shoulder ap (1 of 2)]
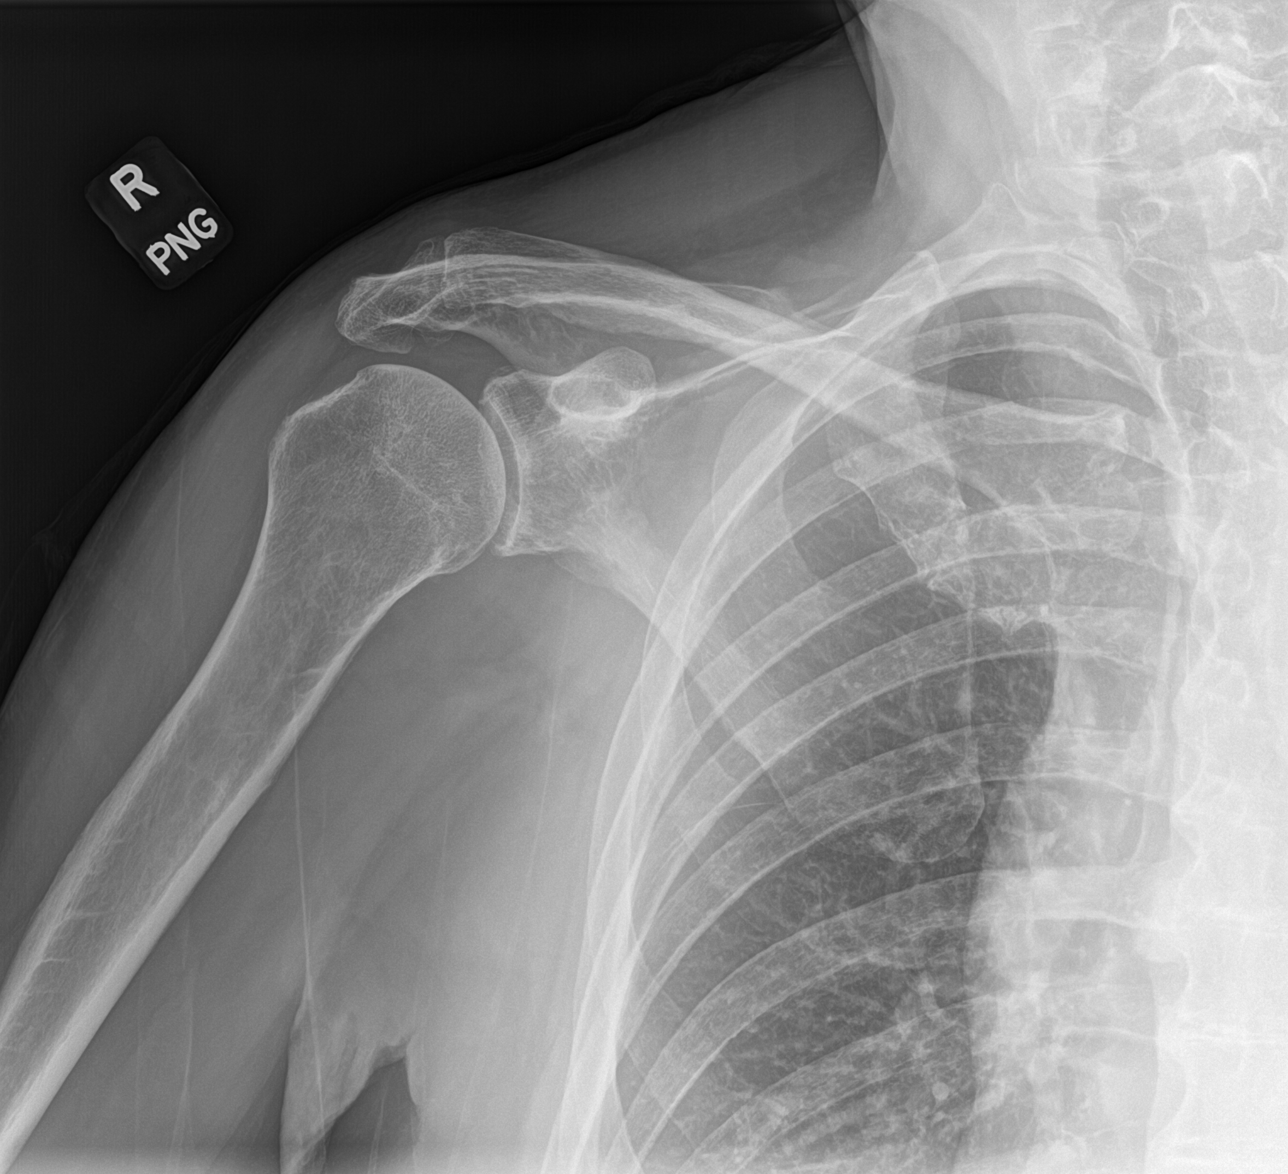

[shoulder ap (2 of 2)]
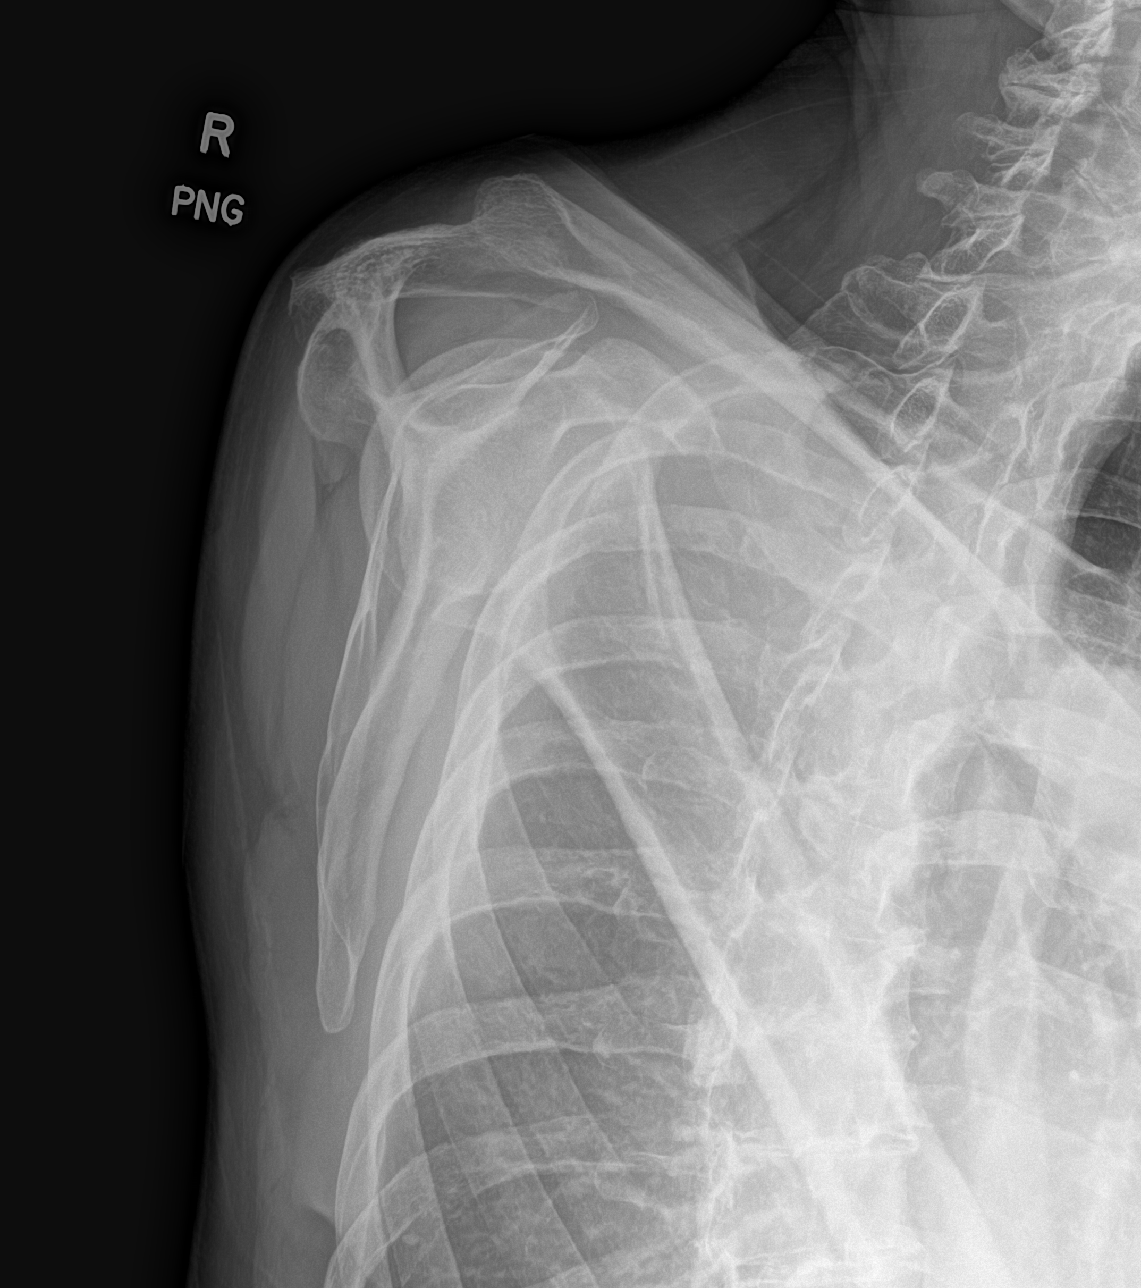

[shoulder axial]
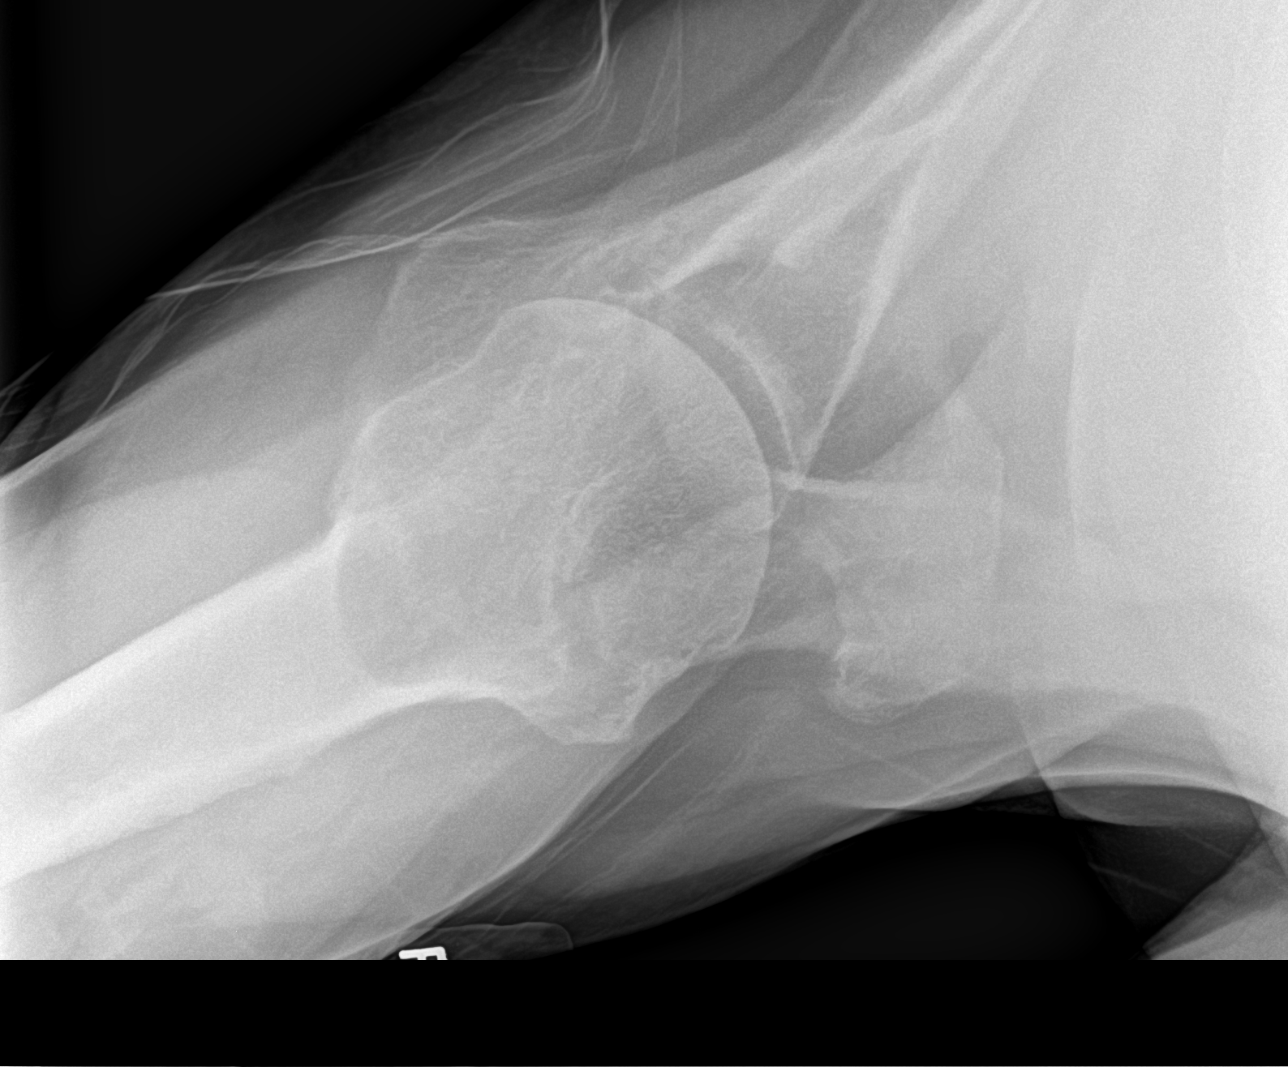

[3 of 3 positions shown; findings below may reference images not displayed]

FINDINGS: There is no evidence of fracture or dislocation. Similar
acromioclavicular degenerative change with spurring. There is a
small subacromial spur. Mild inferior glenohumeral spurring. No
evidence of a vascular necrosis or focal bone lesion. Soft tissues
are unremarkable.
IMPRESSION: 1. Mild acromioclavicular and glenohumeral osteoarthritis.
2. Small subacromial spur.

## 2021-07-18 DIAGNOSIS — D1722 Benign lipomatous neoplasm of skin and subcutaneous tissue of left arm: Secondary | ICD-10-CM | POA: Diagnosis not present

## 2021-07-18 DIAGNOSIS — L821 Other seborrheic keratosis: Secondary | ICD-10-CM | POA: Diagnosis not present

## 2021-07-18 DIAGNOSIS — C44719 Basal cell carcinoma of skin of left lower limb, including hip: Secondary | ICD-10-CM | POA: Diagnosis not present

## 2021-07-18 DIAGNOSIS — D1721 Benign lipomatous neoplasm of skin and subcutaneous tissue of right arm: Secondary | ICD-10-CM | POA: Diagnosis not present

## 2021-07-18 DIAGNOSIS — D225 Melanocytic nevi of trunk: Secondary | ICD-10-CM | POA: Diagnosis not present

## 2021-07-18 DIAGNOSIS — Z85828 Personal history of other malignant neoplasm of skin: Secondary | ICD-10-CM | POA: Diagnosis not present

## 2021-07-18 DIAGNOSIS — L814 Other melanin hyperpigmentation: Secondary | ICD-10-CM | POA: Diagnosis not present

## 2021-07-18 DIAGNOSIS — D485 Neoplasm of uncertain behavior of skin: Secondary | ICD-10-CM | POA: Diagnosis not present

## 2021-07-18 DIAGNOSIS — L988 Other specified disorders of the skin and subcutaneous tissue: Secondary | ICD-10-CM | POA: Diagnosis not present

## 2021-07-18 DIAGNOSIS — L570 Actinic keratosis: Secondary | ICD-10-CM | POA: Diagnosis not present

## 2021-08-01 NOTE — Progress Notes (Signed)
?Cardiology Office Note:   ? ?Date:  08/09/2021  ? ?ID:  [name] [name], DOB 02-Aug-1950, MRN [id number] ? ?PCP:  Binnie Rail, MD  ?Cardiologist:  Sanda Klein, MD  ?Electrophysiologist:  None  ? ?Referring MD: Binnie Rail, MD  ? ?Chief Complaint  ?Patient presents with  ? Follow-up  ? ? ?History of Present Illness:   ? ?[name] [name] is a 71 y.o. male with a hx of paroxysmal atrial fibrillation led to a brief hospitalization in March 2019, without clinical recurrence.  He has systemic hypertension and peripheral venous insufficiency, but no structural heart disease other than mild left ventricular hypertrophy.  His left atrium was normal in size and left ventricular systolic function is normal.  He does not have significant valvular abnormalities other than mild aortic valve calcification without stenosis. ? ?He has not had any clinical recurrence of atrial fibrillation.  He wears a smart watch that has not shown arrhythmia.  He continues to be very physically active and has no exertional related complaints.  Usually gets at least a half an hour of exercise daily, walking, using his elliptical at home or working in the yard. ? ?The patient specifically denies any chest pain at rest exertion, dyspnea at rest or with exertion, orthopnea, paroxysmal nocturnal dyspnea, syncope, palpitations, focal neurological deficits, intermittent claudication, unexplained weight gain, cough, hemoptysis or wheezing.  He has mild lower extremity edema (sock leaves)).  He has a history of peripheral venous insufficiency for which she has previously undergone venous ablation. ? ?He underwent screening ABIs performed by [facility] and these were abnormal: 0.81 on the right, 0.90 on the left.  Interestingly, his blood pressure was elevated in his arm at that evaluation at about 152/96 which is quite unusual for him.  Usually his blood pressure is around 130s/70s. ? ?He has had ablation of the right greater saphenous  vein, but does not have a history of DVT.  He described fatigue with the beta-blockers and the dose of Lopressor was reduced to 12.5 mg twice daily which led to improvement in his symptoms.   ? ? ?Past Medical History:  ?Diagnosis Date  ? Atrial fibrillation with RVR ([place])   ? Converted to NSR with lopressor  ? Benign prostatic hypertrophy with elevated PSA   ? Dr Alinda Money, High grade PIN w/o malignancy  ? History of colonic polyps 2003, 2007  ? hyperplastic  ? Hx of colonoscopy 2012  ? neg  ? Hyperlipidemia   ? Hypertension   ? Retinal vascular occlusion   ? Dr Delman Cheadle  ? ? ?Past Surgical History:  ?Procedure Laterality Date  ? APPENDECTOMY    ? COLONOSCOPY W/ POLYPECTOMY    ?  X2 , Dr Deatra Ina  ? ENDOVENOUS ABLATION SAPHENOUS VEIN W/ LASER Left 09/29/2017  ? endovenous laser ablation left greater saphenous vein by Tinnie Gens MD   ? HERNIA REPAIR  [phone]  ?  Dr Margot Chimes  ? lichenoid keratosis resected  8/14  ? LIPOMA EXCISION    ? X 5  ? PROSTATE BIOPSY  09/2010  ? Dr Alinda Money  ? stab phlebectomy  Left 01/27/2018  ? stab phlebectomy 10-20 incisions left leg by Ruta Hinds MD  ? ? ?Current Medications: ?Current Meds  ?Medication Sig  ? apixaban (ELIQUIS) 5 MG TABS tablet Take 1 tablet by mouth twice daily  ? irbesartan (AVAPRO) 75 MG tablet Take 1 tablet by mouth once daily  ? metoprolol succinate (TOPROL-XL) 25 MG 24 hr  tablet Take 1 tablet by mouth once daily  ?  ? ?Allergies:   Patient has no known allergies.  ? ?Social History  ? ?Socioeconomic History  ? Marital status: Married  ?  Spouse name: Not on file  ? Number of children: 3  ? Years of education: Not on file  ? Highest education level: Not on file  ?Occupational History  ? Not on file  ?Tobacco Use  ? Smoking status: Never  ? Smokeless tobacco: Never  ?Vaping Use  ? Vaping Use: Never used  ?Substance and Sexual Activity  ? Alcohol use: Yes  ?  Alcohol/week: 3.0 standard drinks  ?  Types: 3 Glasses of wine Declin week  ?  Comment: daily  ? Drug use: No  ?  Sexual activity: Not on file  ?Other Topics Concern  ? Not on file  ?Social History Narrative  ? Not on file  ? ?Social Determinants of Health  ? ?Financial Resource Strain: Low Risk   ? Difficulty of Paying Living Expenses: Not [name] at all  ?Food Insecurity: No Food Insecurity  ? Worried About Charity fundraiser in the Last Year: Never true  ? Ran Out of Food in the Last Year: Never true  ?Transportation Needs: No Transportation Needs  ? Lack of Transportation (Medical): No  ? Lack of Transportation (Non-Medical): No  ?Physical Activity: Sufficiently Active  ? Days of Exercise Deleon Week: 5 days  ? Minutes of Exercise Kayshaun Session: 30 min  ?Stress: No Stress Concern Present  ? Feeling of Stress : Not at all  ?Social Connections: Socially Integrated  ? Frequency of Communication with Friends and Family: More than three times a week  ? Frequency of Social Gatherings with Friends and Family: Once a week  ? Attends Religious Services: More than 4 times [place] year  ? Active Member of Clubs or Organizations: Yes  ? Attends Archivist Meetings: More than 4 times [place] year  ? Marital Status: Married  ?  ? ?Family History: ?The patient's family history includes Alzheimer's disease in his mother; Heart attack (age of onset: 59) in his father; Hypertension in his brother and maternal grandmother. There is no history of Diabetes, Stroke, Colon cancer, Esophageal cancer, Pancreatic cancer, Rectal cancer, or Stomach cancer. ? ?ROS:   ?Please see the history of present illness.    ?All other systems are reviewed and are negative. ? ?EKGs/Labs/Other Studies Reviewed:   ? ?EKG:  EKG is ordered today.  Shows normal sinus rhythm is a completely normal tracing. ?Recent Labs: ?03/25/2021: ALT 19; BUN 23; Creatinine, Ser 0.88; Hemoglobin 13.6; Platelets 201.0; Potassium 4.7; Sodium 139; TSH 1.44  ?Recent Lipid Panel ?   ?Component Value Date/Time  ? CHOL 182 03/25/2021 0922  ? TRIG 52.0 03/25/2021 0922  ? HDL 76.60 03/25/2021 0922   ? CHOLHDL 2 03/25/2021 1696  ? VLDL 10.4 03/25/2021 0922  ? [place] 95 03/25/2021 0922  ? LDLDIRECT 108.9 05/17/2009 0000  ? ? ?Physical Exam:   ? ?VS:  BP 138/68   Pulse 66   Ht '6\' 4"'$  (1.93 m)   Wt 227 lb (103 kg)   BMI 27.63 kg/m?    ? ?Wt Readings from Last 3 Encounters:  ?08/09/21 227 lb (103 kg)  ?05/28/21 224 lb (101.6 kg)  ?04/24/21 220 lb (99.8 kg)  ?  ? ? ?General: Alert, oriented x3, no distress, appears healthy and fit ?Head: no evidence of trauma, PERRL, EOMI, no exophtalmos or lid lag,  no myxedema, no xanthelasma; normal ears, nose and oropharynx ?Neck: normal jugular venous pulsations and no hepatojugular reflux; brisk carotid pulses without delay and no carotid bruits ?Chest: clear to auscultation, no signs of consolidation by percussion or palpation, normal fremitus, symmetrical and full respiratory excursions ?Cardiovascular: normal position and quality of the apical impulse, regular rhythm, normal first and second heart sounds, 1/6 early peaking aortic ejection murmur, no diastolic murmurs, rubs or gallops ?Abdomen: no tenderness or distention, no masses by palpation, no abnormal pulsatility or arterial bruits, normal bowel sounds, no hepatosplenomegaly ?Extremities: no clubbing, cyanosis; he has mild-moderate lower extremity varicose veins and has trivial edema in a symmetrical pattern; 2+ radial, ulnar and brachial pulses bilaterally; 2+ right femoral, posterior tibial and dorsalis pedis pulses; 2+ left femoral, posterior tibial and dorsalis pedis pulses; no subclavian or femoral bruits ?Neurological: grossly nonfocal ?Psych: Normal mood and affect ? ? ?ASSESSMENT:   ? ?1. Paroxysmal atrial fibrillation (HCC)   ?2. Murmur   ?3. Abnormal ankle brachial index (ABI)   ?4. Peripheral venous insufficiency   ?5. Essential hypertension   ?6. Acquired thrombophilia ([place])   ? ? ?PLAN:   ? ?In order of problems listed above: ? ?AFib: Asymptomatic.  No episodes recorded by smart watch.  Episodes are  very infrequent.  He only tolerates a very low-dose of beta-blocker. ?Aortic sclerosis: Explains his short systolic murmur.  No symptoms of aortic stenosis.  Asked him to call us if he develops exertional angina/d

## 2021-08-06 DIAGNOSIS — E663 Overweight: Secondary | ICD-10-CM | POA: Diagnosis not present

## 2021-08-06 DIAGNOSIS — I10 Essential (primary) hypertension: Secondary | ICD-10-CM | POA: Diagnosis not present

## 2021-08-06 DIAGNOSIS — F1020 Alcohol dependence, uncomplicated: Secondary | ICD-10-CM | POA: Diagnosis not present

## 2021-08-06 DIAGNOSIS — D6869 Other thrombophilia: Secondary | ICD-10-CM | POA: Diagnosis not present

## 2021-08-06 DIAGNOSIS — E785 Hyperlipidemia, unspecified: Secondary | ICD-10-CM | POA: Diagnosis not present

## 2021-08-06 DIAGNOSIS — I739 Peripheral vascular disease, unspecified: Secondary | ICD-10-CM | POA: Diagnosis not present

## 2021-08-06 DIAGNOSIS — Z87891 Personal history of nicotine dependence: Secondary | ICD-10-CM | POA: Diagnosis not present

## 2021-08-06 DIAGNOSIS — I480 Paroxysmal atrial fibrillation: Secondary | ICD-10-CM | POA: Diagnosis not present

## 2021-08-06 DIAGNOSIS — K649 Unspecified hemorrhoids: Secondary | ICD-10-CM | POA: Diagnosis not present

## 2021-08-07 ENCOUNTER — Telehealth: Payer: Self-pay | Admitting: Internal Medicine

## 2021-08-07 NOTE — Telephone Encounter (Signed)
It looks like he has an appointment with his cardiologist in a couple of days.  I would recommend that he brings this up to the cardiologist to see if he wants to have further evaluation. ?

## 2021-08-07 NOTE — Telephone Encounter (Signed)
Loyal Gambler calls today to report Quantaflo readings from PT's right leg. PT is showing abnormal readings in right leg. PT has had no complaints of pain and no new symptoms! ? ?CB: [phone] ?

## 2021-08-08 NOTE — Telephone Encounter (Signed)
Spoke with patient today and info given. 

## 2021-08-09 ENCOUNTER — Other Ambulatory Visit: Payer: Self-pay

## 2021-08-09 ENCOUNTER — Encounter: Payer: Self-pay | Admitting: Cardiovascular Disease

## 2021-08-09 ENCOUNTER — Ambulatory Visit: Payer: PPO | Admitting: Cardiovascular Disease

## 2021-08-09 VITALS — BP 138/68 | HR 66 | Ht 76.0 in | Wt 227.0 lb

## 2021-08-09 DIAGNOSIS — D6869 Other thrombophilia: Secondary | ICD-10-CM | POA: Diagnosis not present

## 2021-08-09 DIAGNOSIS — I10 Essential (primary) hypertension: Secondary | ICD-10-CM | POA: Diagnosis not present

## 2021-08-09 DIAGNOSIS — R6889 Other general symptoms and signs: Secondary | ICD-10-CM

## 2021-08-09 DIAGNOSIS — I872 Venous insufficiency (chronic) (peripheral): Secondary | ICD-10-CM

## 2021-08-09 DIAGNOSIS — I480 Paroxysmal atrial fibrillation: Secondary | ICD-10-CM

## 2021-08-09 DIAGNOSIS — R011 Cardiac murmur, unspecified: Secondary | ICD-10-CM | POA: Diagnosis not present

## 2021-08-09 NOTE — Patient Instructions (Addendum)
Medication Instructions:  ?No changes ?*If you need a refill on your cardiac medications before your next appointment, please call your pharmacy* ? ? ?Lab Work: ?None ordered ?If you have labs (blood work) drawn today and your tests are completely normal, you will receive your results only by: ?MyChart Message (if you have MyChart) OR ?A paper copy in the mail ?If you have any lab test that is abnormal or we need to change your treatment, we will call you to review the results. ? ? ?Testing/Procedures: ?Your physician has requested that you have a lower extremity arterial duplex. During this test, ultrasound is used to evaluate arterial blood flow in the legs. Allow one hour for this exam. There are no restrictions or special instructions. This will take place at [place], [address]. ? ?Follow-Up: ?At [facility], you and your health needs are our priority.  As part of our continuing mission to provide you with exceptional heart care, we have created designated Provider Care Teams.  These Care Teams include your primary Cardiologist (physician) and Advanced Practice Providers (APPs -  Physician Assistants and Nurse Practitioners) who all work together to provide you with the care you need, when you need it. ? ?We recommend signing up for the patient portal called "MyChart".  Sign up information is provided on this After Visit Summary.  MyChart is used to connect with patients for Virtual Visits (Telemedicine).  Patients are able to view lab/test results, encounter notes, upcoming appointments, etc.  Non-urgent messages can be sent to your provider as well.   ?To learn more about what you can do with MyChart, go to [email or web address].   ? ?Your next appointment:   ?12 month(s) ? ?The format for your next appointment:   ?In Person ? ?Provider:   ?Sanda Klein, MD  ? ? ?

## 2021-08-23 ENCOUNTER — Other Ambulatory Visit (HOSPITAL_COMMUNITY): Payer: Self-pay | Admitting: Cardiovascular Disease

## 2021-08-23 DIAGNOSIS — I739 Peripheral vascular disease, unspecified: Secondary | ICD-10-CM

## 2021-09-04 DIAGNOSIS — D225 Melanocytic nevi of trunk: Secondary | ICD-10-CM | POA: Diagnosis not present

## 2021-09-04 DIAGNOSIS — Z85828 Personal history of other malignant neoplasm of skin: Secondary | ICD-10-CM | POA: Diagnosis not present

## 2021-09-04 DIAGNOSIS — D485 Neoplasm of uncertain behavior of skin: Secondary | ICD-10-CM | POA: Diagnosis not present

## 2021-09-06 ENCOUNTER — Ambulatory Visit (HOSPITAL_COMMUNITY)
Admission: RE | Admit: 2021-09-06 | Discharge: 2021-09-06 | Disposition: A | Discharge status: Home / Self Care | Payer: PPO | Source: Ambulatory Visit | Attending: General Practice | Admitting: General Practice

## 2021-09-06 DIAGNOSIS — R6889 Other general symptoms and signs: Secondary | ICD-10-CM | POA: Diagnosis not present

## 2021-09-06 DIAGNOSIS — I739 Peripheral vascular disease, unspecified: Secondary | ICD-10-CM | POA: Diagnosis not present

## 2021-09-17 DIAGNOSIS — H903 Sensorineural hearing loss, bilateral: Secondary | ICD-10-CM | POA: Diagnosis not present

## 2021-09-20 ENCOUNTER — Telehealth: Payer: PPO

## 2021-09-26 DIAGNOSIS — H903 Sensorineural hearing loss, bilateral: Secondary | ICD-10-CM | POA: Diagnosis not present

## 2021-09-27 ENCOUNTER — Ambulatory Visit: Payer: PPO

## 2021-10-25 ENCOUNTER — Other Ambulatory Visit: Payer: Self-pay | Admitting: Cardiovascular Disease

## 2021-10-25 DIAGNOSIS — I480 Paroxysmal atrial fibrillation: Secondary | ICD-10-CM

## 2021-10-25 NOTE — Telephone Encounter (Signed)
Eliquis '5mg'$  refill request received. Patient is 71 years old, weight-103kg, Crea-0.88 on 03/25/2021, Diagnosis-Afib, and last seen by Dr. Luane School on 08/09/2021. Dose is appropriate based on dosing criteria. Will send in refill to requested pharmacy.

## 2021-11-22 ENCOUNTER — Telehealth: Payer: PPO

## 2021-12-19 ENCOUNTER — Ambulatory Visit (INDEPENDENT_AMBULATORY_CARE_PROVIDER_SITE_OTHER): Payer: PPO

## 2021-12-19 DIAGNOSIS — Z0000 Encounter for general adult medical examination without abnormal findings: Secondary | ICD-10-CM | POA: Diagnosis not present

## 2021-12-19 NOTE — Progress Notes (Signed)
I connected with  [name] [name] on 12/19/21 by a audio enabled telemedicine application and verified that I am speaking with the correct person using two identifiers.  Patient Location: Home  Provider Location: Office/Clinic  I discussed the limitations of evaluation and management by telemedicine. The patient expressed understanding and agreed to proceed.      Subjective:   [name] [name] is a 71 y.o. male who presents for Medicare Annual/Subsequent preventive examination.  Review of Systems    Defer to PCP       Objective:    There were no vitals filed for this visit. There is no height or weight on file to calculate BMI.     12/19/2021   10:12 AM 04/24/2021   11:52 AM 09/25/2020    2:33 PM 07/30/2017    2:00 AM 07/29/2017    6:32 PM 05/23/2015   12:13 PM  Advanced Directives  Does Patient Have a Medical Advance Directive? Yes No Yes Yes Yes Yes  Type of Paramedic of [place];Living will  Living will;Healthcare Power of [place]    Does patient want to make changes to medical advance directive? No - Patient declined  No - Patient declined No - Patient declined    Copy of [place] in Chart? Yes - validated most recent copy scanned in chart (See row information)   No - copy requested    Would patient like information on creating a medical advance directive?    No - Patient declined      Current Medications (verified) Outpatient Encounter Medications as of 12/19/2021  Medication Sig   ELIQUIS 5 MG TABS tablet Take 1 tablet by mouth twice daily   irbesartan (AVAPRO) 75 MG tablet Take 1 tablet by mouth once daily   metoprolol succinate (TOPROL-XL) 25 MG 24 hr tablet Take 1 tablet by mouth once daily   No facility-administered encounter medications on file as of 12/19/2021.    Allergies (verified) Patient has no known allergies.   History: Past Medical History:  Diagnosis Date   Atrial fibrillation with  RVR ([place])    Converted to NSR with lopressor   Benign prostatic hypertrophy with elevated PSA    Dr Alinda Money, High grade PIN w/o malignancy   History of colonic polyps 2003, 2007   hyperplastic   Hx of colonoscopy 2012   neg   Hyperlipidemia    Hypertension    Retinal vascular occlusion    Dr Delman Cheadle   Past Surgical History:  Procedure Laterality Date   APPENDECTOMY     COLONOSCOPY W/ POLYPECTOMY      X2 , Dr Deatra Ina   ENDOVENOUS ABLATION SAPHENOUS VEIN W/ LASER Left 09/29/2017   endovenous laser ablation left greater saphenous vein by Tinnie Gens MD    HERNIA REPAIR  [phone]    Dr Margot Chimes   lichenoid keratosis resected  8/14   LIPOMA EXCISION     X 5   PROSTATE BIOPSY  09/2010   Dr Alinda Money   stab phlebectomy  Left 01/27/2018   stab phlebectomy 10-20 incisions left leg by Ruta Hinds MD   Family History  Problem Relation Age of Onset   Alzheimer's disease Mother    Heart attack Father 35   Hypertension Brother    Hypertension Maternal Grandmother    Diabetes Neg Hx    Stroke Neg Hx    Colon cancer Neg Hx    Esophageal cancer Neg Hx  Pancreatic cancer Neg Hx    Rectal cancer Neg Hx    Stomach cancer Neg Hx    Social History   Socioeconomic History   Marital status: Married    Spouse name: Not on file   Number of children: 3   Years of education: Not on file   Highest education level: Not on file  Occupational History   Not on file  Tobacco Use   Smoking status: Never   Smokeless tobacco: Never  Vaping Use   Vaping Use: Never used  Substance and Sexual Activity   Alcohol use: Yes    Alcohol/week: 3.0 standard drinks of alcohol    Types: 3 Glasses of wine K[place] week    Comment: daily   Drug use: No   Sexual activity: Not on file  Other Topics Concern   Not on file  Social History Narrative   Not on file   Social Determinants of Health   Financial Resource Strain: Low Risk  (09/25/2020)   Overall Financial Resource Strain (CARDIA)    Difficulty of  Paying Living Expenses: Not hard at all  Food Insecurity: No Food Insecurity (09/25/2020)   Hunger Vital Sign    Worried About Running Out of Food in the Last Year: Never true    [place] in the Last Year: Never true  Transportation Needs: No Transportation Needs (09/25/2020)   PRAPARE - Hydrologist (Medical): No    Lack of Transportation (Non-Medical): No  Physical Activity: Sufficiently Active (09/25/2020)   Exercise Vital Sign    Days of Exercise [place] Week: 5 days    Minutes of Exercise [place] Session: 30 min  Stress: No Stress Concern Present (09/25/2020)   [place] of Stress : Not at all  Social Connections: [place] (09/25/2020)   Social Connection and Isolation Panel [NHANES]    Frequency of Communication with Friends and Family: More than three times a week    Frequency of Social Gatherings with Friends and Family: Once a week    Attends Religious Services: More than 4 times [place] year    Active Member of [employer] or Organizations: Yes    Attends Music therapist: More than 4 times [place] year    Marital Status: Married    Tobacco Counseling Counseling given: Not Answered   Clinical Intake:   Pre-visit preparation completed: No  Pain : No/denies pain     Nutritional Status: BMI 25 -29 Overweight Nutritional Risks: None Diabetes: No  How often do you need to have someone help you when you read instructions, pamphlets, or other written materials from your doctor or pharmacy?: 1 - Never What is the last grade level you completed in school?: BSN  Diabetic? No  Interpreter Needed?: No      Activities of Daily Living    12/19/2021    2:43 PM  In your present state of health, do you have any difficulty performing the following activities:  Hearing? 1  Comment Wears bilateral hearing aides  Vision? 0  Difficulty concentrating or making  decisions? 0  Walking or climbing stairs? 0  Dressing or bathing? 0  Doing errands, shopping? 0    Patient Care Team: Binnie Rail, MD as PCP - General (Internal Medicine) Sanda Klein, MD as PCP - Cardiology (Cardiology) Charlton Haws, [facility] as Pharmacist (Pharmacist)  Indicate any recent Medical Services you may have received  from other than [facility] providers in the past year (date may be approximate).     Assessment:   This is a routine wellness examination for [name] [name]. Stacy Burns  Hearing/Vision screen No results found.  Dietary issues and exercise activities discussed: Current Exercise Habits: Home exercise routine, Type of exercise: calisthenics;treadmill, Time (Minutes): 30, Frequency (Times/Week): 3, Weekly Exercise (Minutes/Week): 90, Intensity: Moderate   Goals Addressed             This Visit's Progress    golf   On track    To play more golf and less yard work.       Depression Screen    12/19/2021   11:21 AM 09/25/2020    2:32 PM 12/14/2019    1:12 PM 12/10/2018   10:53 AM 06/10/2017    9:17 AM 05/29/2016   11:24 AM 05/23/2015   12:14 PM  PHQ 2/9 Scores  PHQ - 2 Score 0 0 0 0 0 0 0  Exception Documentation Patient refusal          Fall Risk    12/19/2021   11:21 AM 09/25/2020    2:33 PM 04/08/2019    2:38 PM 12/10/2018   10:53 AM 06/10/2017    9:17 AM  [place] in the past year? 0 0 0 0 No  Comment   Emmi Telephone Survey: data to providers prior to load    Number falls in past yr: 0 0     Injury with Fall? 0 0     Risk for fall due to :  No Fall Risks     Follow up Falls evaluation completed Falls evaluation completed       [place]:  Any stairs in or around the home? Yes  If so, are there any without handrails? No  Home free of loose throw rugs in walkways, pet beds, electrical cords, etc? No  Adequate lighting in your home to reduce risk of falls? Yes   ASSISTIVE DEVICES UTILIZED TO PREVENT  FALLS:  Life alert?  Uses a Smart Watch Use of a cane, walker or w/c? No  Grab bars in the bathroom? Yes  Shower chair or bench in shower? Yes  Elevated toilet seat or a handicapped toilet? No   TIMED UP AND GO:  Was the test performed? No .  Length of time to ambulate 10 feet: Deferred to PCP    Gait steady and fast without use of assistive device  Cognitive Function:        Immunizations Immunization History  Administered Date(s) Administered   Fluad Quad(high Dose 65+) 01/20/2020   H1N1 05/15/2008   Influenza Split 02/17/2011, 02/29/2012   Influenza Whole 03/16/2007, 02/12/2009, 03/05/2010   Influenza, High Dose Seasonal PF 02/01/2014, 02/21/2017   Influenza-Unspecified 02/23/2013, 02/12/2015, 02/12/2016, 02/21/2017, 03/01/2018, 01/20/2020, 02/28/2021   PFIZER(Purple Top)SARS-COV-2 Vaccination 06/12/2019, 07/03/2019, 02/15/2020, 09/28/2020   Pfizer Covid-19 Vaccine Bivalent Booster 78yr & up 03/26/2021   Pneumococcal Conjugate-13 02/01/2014   Pneumococcal Polysaccharide-23 04/06/2015   Td 02/22/2008   Tdap 10/02/2011   Zoster Recombinat (Shingrix) 01/06/2018, 04/21/2018   Zoster, Live 06/01/2014    TDAP status: Due, Education has been provided regarding the importance of this vaccine. Advised may receive this vaccine at local pharmacy or Health Dept. Aware to provide a copy of the vaccination record if obtained from local pharmacy or Health Dept. Verbalized acceptance and understanding.  Flu Vaccine status: Due, Education has been provided regarding the  importance of this vaccine. Advised may receive this vaccine at local pharmacy or Health Dept. Aware to provide a copy of the vaccination record if obtained from local pharmacy or Health Dept. Verbalized acceptance and understanding.  Pneumococcal vaccine status: Up to date  Covid-19 vaccine status: Completed vaccines  Qualifies for Shingles Vaccine? No   Zostavax completed Yes   Shingrix Completed?:  Yes  Screening Tests Health Maintenance  Topic Date Due   TETANUS/TDAP  10/01/2021   COLONOSCOPY (Pts 45-26yr Insurance coverage will need to be confirmed)  05/28/2026   Pneumonia Vaccine 71 Years old  Completed   COVID-19 Vaccine  Completed   Hepatitis C Screening  Completed   Zoster Vaccines- Shingrix  Completed   HPV VACCINES  Aged Out    Health Maintenance  Health Maintenance Due  Topic Date Due   TETANUS/TDAP  10/01/2021    Colorectal cancer screening: Type of screening: Colonoscopy. Completed 05/28/2021. Repeat every 5 years  Lung Cancer Screening: (Low Dose CT Chest recommended if Age 557-80years, 30 pack-year currently smoking OR have quit w/in 15years.) does not qualify.   Lung Cancer Screening Referral:   Additional Screening:  Hepatitis C Screening:  Completed   Vision Screening: Recommended annual ophthalmology exams for early detection of glaucoma and other disorders of the eye. Is the patient up to date with their annual eye exam?  Yes  Who is the provider or what is the name of the office in which the patient attends annual eye exams? Dr. GDelman CheadleIf pt is not established with a provider, would they like to be referred to a provider to establish care? No .   Dental Screening: Recommended annual dental exams for proper oral hygiene  Community Resource Referral / Chronic Care Management: CRR required this visit?  No   CCM required this visit?  No      Plan:     I have personally reviewed and noted the following in the patient's chart:   Medical and social history Use of alcohol, tobacco or illicit drugs  Current medications and supplements including opioid prescriptions. Patient is not currently taking opioid prescriptions. Functional ability and status Nutritional status Physical activity Advanced directives List of other physicians Hospitalizations, surgeries, and ER visits in previous 12 months Vitals Screenings to include cognitive,  depression, and falls Referrals and appointments  In addition, I have reviewed and discussed with patient certain preventive protocols, quality metrics, and best practice recommendations. A written personalized care plan for preventive services as well as general preventive health recommendations were provided to patient.     THenrene Dodge RN   12/19/2021   Nurse Notes: Non Face to Face Minutes 30 minutes  [name] [name],  Thank you for taking time to come for your Medicare Wellness Visit. I appreciate your ongoing commitment to your health goals. Please review the following plan we discussed and let me know if I can assist you in the future.     This is a list of the screening recommended for you and due dates:  Health Maintenance  Topic Date Due   Tetanus Vaccine  10/01/2021   Colon Cancer Screening  05/28/2026   Pneumonia Vaccine  Completed   COVID-19 Vaccine  Completed   Hepatitis C Screening: USPSTF Recommendation to screen - Ages 133-79yo.  Completed   Zoster (Shingles) Vaccine  Completed   HPV Vaccine  Aged Out

## 2021-12-19 NOTE — Patient Instructions (Signed)
Health Maintenance, Male Adopting a healthy lifestyle and getting preventive care are important in promoting health and wellness. Ask your health care provider about: The right schedule for you to have regular tests and exams. Things you can do on your own to prevent diseases and keep yourself healthy. What should I know about diet, weight, and exercise? Eat a healthy diet  Eat a diet that includes plenty of vegetables, fruits, low-fat dairy products, and lean protein. Do not eat a lot of foods that are high in solid fats, added sugars, or sodium. Maintain a healthy weight Body mass index (BMI) is a measurement that can be used to identify possible weight problems. It estimates body fat based on height and weight. Your health care provider can help determine your BMI and help you achieve or maintain a healthy weight. Get regular exercise Get regular exercise. This is one of the most important things you can do for your health. Most adults should: Exercise for at least 150 minutes each week. The exercise should increase your heart rate and make you sweat (moderate-intensity exercise). Do strengthening exercises at least twice a week. This is in addition to the moderate-intensity exercise. Spend less time sitting. Even light physical activity can be beneficial. Watch cholesterol and blood lipids Have your blood tested for lipids and cholesterol at 73 years of age, then have this test every 5 years. You may need to have your cholesterol levels checked more often if: Your lipid or cholesterol levels are high. You are older than 73 years of age. You are at high risk for heart disease. What should I know about cancer screening? Many types of cancers can be detected early and may often be prevented. Depending on your health history and family history, you may need to have cancer screening at various ages. This may include screening for: Colorectal cancer. Prostate cancer. Skin cancer. Lung  cancer. What should I know about heart disease, diabetes, and high blood pressure? Blood pressure and heart disease High blood pressure causes heart disease and increases the risk of stroke. This is more likely to develop in people who have high blood pressure readings or are overweight. Talk with your health care provider about your target blood pressure readings. Have your blood pressure checked: Every 3-5 years if you are 18-39 years of age. Every year if you are 40 years old or older. If you are between the ages of 65 and 75 and are a current or former smoker, ask your health care provider if you should have a one-time screening for abdominal aortic aneurysm (AAA). Diabetes Have regular diabetes screenings. This checks your fasting blood sugar level. Have the screening done: Once every three years after age 45 if you are at a normal weight and have a low risk for diabetes. More often and at a younger age if you are overweight or have a high risk for diabetes. What should I know about preventing infection? Hepatitis B If you have a higher risk for hepatitis B, you should be screened for this virus. Talk with your health care provider to find out if you are at risk for hepatitis B infection. Hepatitis C Blood testing is recommended for: Everyone born from 1945 through 1965. Anyone with known risk factors for hepatitis C. Sexually transmitted infections (STIs) You should be screened each year for STIs, including gonorrhea and chlamydia, if: You are sexually active and are younger than 73 years of age. You are older than 73 years of age and your   health care provider tells you that you are at risk for this type of infection. Your sexual activity has changed since you were last screened, and you are at increased risk for chlamydia or gonorrhea. Ask your health care provider if you are at risk. Ask your health care provider about whether you are at high risk for HIV. Your health care provider  may recommend a prescription medicine to help prevent HIV infection. If you choose to take medicine to prevent HIV, you should first get tested for HIV. You should then be tested every 3 months for as long as you are taking the medicine. Follow these instructions at home: Alcohol use Do not drink alcohol if your health care provider tells you not to drink. If you drink alcohol: Limit how much you have to 0-2 drinks a day. Know how much alcohol is in your drink. In the [place] one drink equals one 12 oz bottle of beer (355 mL), one 5 oz glass of wine (148 mL), or one 1 oz glass of hard liquor (44 mL). Lifestyle Do not use any products that contain nicotine or tobacco. These products include cigarettes, chewing tobacco, and vaping devices, such as e-cigarettes. If you need help quitting, ask your health care provider. Do not use street drugs. Do not share needles. Ask your health care provider for help if you need support or information about quitting drugs. General instructions Schedule regular health, dental, and eye exams. Stay current with your vaccines. Tell your health care provider if: You often feel depressed. You have ever been abused or do not feel safe at home. Summary Adopting a healthy lifestyle and getting preventive care are important in promoting health and wellness. Follow your health care provider's instructions about healthy diet, exercising, and getting tested or screened for diseases. Follow your health care provider's instructions on monitoring your cholesterol and blood pressure. This information is not intended to replace advice given to you by your health care provider. Make sure you discuss any questions you have with your health care provider. Document Revised: 09/24/2020 Document Reviewed: 09/24/2020 Elsevier Patient Education  2023 Elsevier Inc.  

## 2021-12-24 NOTE — Progress Notes (Signed)
Cognitive Function: 6 CIT Score O points

## 2022-01-31 ENCOUNTER — Other Ambulatory Visit: Payer: Self-pay | Admitting: Cardiovascular Disease

## 2022-01-31 DIAGNOSIS — I480 Paroxysmal atrial fibrillation: Secondary | ICD-10-CM

## 2022-02-02 ENCOUNTER — Encounter: Payer: Self-pay | Admitting: Internal Medicine

## 2022-02-02 NOTE — Progress Notes (Unsigned)
    Subjective:    Patient ID: [name] [name], male    DOB: 1951-04-05, 71 y.o.   MRN: [id number]      HPI [name] [name] is here for No chief complaint on file.    Upper back pain -     Medications and allergies reviewed with patient and updated if appropriate.  Current Outpatient Medications on File Prior to Visit  Medication Sig Dispense Refill   ELIQUIS 5 MG TABS tablet Take 1 tablet by mouth twice daily 180 tablet 0   irbesartan (AVAPRO) 75 MG tablet Take 1 tablet by mouth once daily 90 tablet 3   metoprolol succinate (TOPROL-XL) 25 MG 24 hr tablet Take 1 tablet by mouth once daily 90 tablet 3   No current facility-administered medications on file prior to visit.    Review of Systems     Objective:  There were no vitals filed for this visit. BP Readings from Last 3 Encounters:  08/09/21 138/68  05/28/21 126/84  04/24/21 114/71   Wt Readings from Last 3 Encounters:  08/09/21 227 lb (103 kg)  05/28/21 224 lb (101.6 kg)  04/24/21 220 lb (99.8 kg)   There is no height or weight on file to calculate BMI.    Physical Exam         Assessment & Plan:    See Problem List for Assessment and Plan of chronic medical problems.

## 2022-02-03 NOTE — Telephone Encounter (Signed)
Prescription refill request for Eliquis received. Indication:Afib  Last office visit:08/09/21 ([place])  Scr: 0.88 (03/25/21)  Age: 71 Weight: 103kg  Appropriate dose and refill sent to requested pharmacy.

## 2022-02-06 ENCOUNTER — Ambulatory Visit (INDEPENDENT_AMBULATORY_CARE_PROVIDER_SITE_OTHER): Payer: PPO | Admitting: Internal Medicine

## 2022-02-06 ENCOUNTER — Ambulatory Visit (INDEPENDENT_AMBULATORY_CARE_PROVIDER_SITE_OTHER): Payer: PPO

## 2022-02-06 VITALS — BP 136/84 | HR 63 | Temp 98.0°F | Ht 76.0 in | Wt 223.0 lb

## 2022-02-06 DIAGNOSIS — R293 Abnormal posture: Secondary | ICD-10-CM

## 2022-02-06 DIAGNOSIS — M546 Pain in thoracic spine: Secondary | ICD-10-CM | POA: Diagnosis not present

## 2022-02-06 DIAGNOSIS — M549 Dorsalgia, unspecified: Secondary | ICD-10-CM

## 2022-02-06 DIAGNOSIS — I10 Essential (primary) hypertension: Secondary | ICD-10-CM

## 2022-02-06 DIAGNOSIS — M79671 Pain in right foot: Secondary | ICD-10-CM | POA: Diagnosis not present

## 2022-02-06 NOTE — Assessment & Plan Note (Signed)
Acute Pain in lateral mid right foot Non tender with palpation Consider referral to podiatry or sports med

## 2022-02-06 NOTE — Patient Instructions (Addendum)
    Have xrays done downstairs    A referral was ordered for physical therapy.     Someone from that office will call you to schedule an appointment.    Return if symptoms worsen or fail to improve.

## 2022-02-06 NOTE — Assessment & Plan Note (Signed)
Chronic BP well controlled Continue avapro 75 mg daily, metoprolol xl 25 mg daily

## 2022-02-06 NOTE — Assessment & Plan Note (Signed)
Chronic - getting worse Wants to know what he can do to prevent worsening xrays of c spine and t spine Referral to PT Consider sports med eval

## 2022-02-06 NOTE — Assessment & Plan Note (Signed)
Acute Started abut 3 months ago - no injury Pain across mid back typically after leaning against a hard surface - chair or bed - no pain with exercise or other activities No N/T No tenderness on exam Will xrays of c-spine and t-spine Referral to PT - may be related to poor posture Consider referral to sports med

## 2022-02-09 ENCOUNTER — Encounter: Payer: Self-pay | Admitting: Internal Medicine

## 2022-02-11 ENCOUNTER — Encounter: Payer: Self-pay | Admitting: Orthopaedic Surgery

## 2022-02-11 ENCOUNTER — Ambulatory Visit (INDEPENDENT_AMBULATORY_CARE_PROVIDER_SITE_OTHER): Payer: PPO

## 2022-02-11 ENCOUNTER — Ambulatory Visit: Payer: PPO | Admitting: Orthopaedic Surgery

## 2022-02-11 DIAGNOSIS — M7061 Trochanteric bursitis, right hip: Secondary | ICD-10-CM

## 2022-02-11 DIAGNOSIS — M25551 Pain in right hip: Secondary | ICD-10-CM | POA: Diagnosis not present

## 2022-02-11 DIAGNOSIS — M25561 Pain in right knee: Secondary | ICD-10-CM

## 2022-02-11 DIAGNOSIS — G8929 Other chronic pain: Secondary | ICD-10-CM | POA: Diagnosis not present

## 2022-02-11 MED ORDER — LIDOCAINE HCL 1 % IJ SOLN
3.0000 mL | INTRAMUSCULAR | Status: AC | PRN
  Administered 2022-02-11: 3 mL

## 2022-02-11 MED ORDER — METHYLPREDNISOLONE ACETATE 40 MG/ML IJ SUSP
40.0000 mg | INTRAMUSCULAR | Status: AC | PRN
  Administered 2022-02-11: 40 mg via INTRA_ARTICULAR

## 2022-02-11 NOTE — Progress Notes (Signed)
Office Visit Note   Patient: [name] [name]           Date of Birth: 1951/01/17           MRN: [id number] Visit Date: 02/11/2022              Requested by: Binnie Rail, MD [place],  [place] [address] PCP: Binnie Rail, MD   Assessment & Plan: Visit Diagnoses:  1. Pain of right hip   2. Chronic pain of right knee   3. Trochanteric bursitis, right hip     Plan: I did provide a steroid injection in his right knee and along the right hip trochanteric area which she tolerated well.  We talked about exercise routines.  If things worsen he knows to come back and see [place].  Follow-up is as needed.  All questions and concerns were answered and addressed.  Follow-Up Instructions: Return if symptoms worsen or fail to improve.   Orders:  Orders Placed This Encounter  Procedures   Large Joint Inj   Large Joint Inj   XR HIP UNILAT W OR W/O PELVIS 2-3 VIEWS RIGHT   XR Knee 1-2 Views Right   No orders of the defined types were placed in this encounter.     Procedures: Large Joint Inj: R knee on 02/11/2022 9:22 AM Indications: diagnostic evaluation and pain Details: 22 G 1.5 in needle, superolateral approach  Arthrogram: No  Medications: 3 mL lidocaine 1 %; 40 mg methylPREDNISolone acetate 40 MG/ML Outcome: tolerated well, no immediate complications Procedure, treatment alternatives, risks and benefits explained, specific risks discussed. Consent was given by the patient. Immediately prior to procedure a time out was called to verify the correct patient, procedure, equipment, support staff and site/side marked as required. Patient was prepped and draped in the usual sterile fashion.    Large Joint Inj: R greater trochanter on 02/11/2022 9:22 AM Indications: pain and diagnostic evaluation Details: 22 G 1.5 in needle, lateral approach  Arthrogram: No  Medications: 3 mL lidocaine 1 %; 40 mg methylPREDNISolone acetate 40 MG/ML Outcome: tolerated well, no immediate  complications Procedure, treatment alternatives, risks and benefits explained, specific risks discussed. Consent was given by the patient. Immediately prior to procedure a time out was called to verify the correct patient, procedure, equipment, support staff and site/side marked as required. Patient was prepped and draped in the usual sterile fashion.       Clinical Data: No additional findings.   Subjective: Chief Complaint  Patient presents with   Right Knee - Pain   Right Hip - Pain  The patient is an active 71 year old gentleman that I have not seen in a while.  I injected his left knee before and he said that helped greatly and he still has no knee issues at all.  He has been developing some right hip pain the lateral aspect of his right hip and occasional electric shock type of pain.  He also has had some right knee pain for the last 6 months.  Has tried Voltaren gel.  He has no injury and is not take any type of medications.  He denies any groin pain.  He denies any locking catching with his knee.  His pain does get worse with exercise and he alternates the exercise bike and elliptical.  He is not a diabetic.  HPI  Review of Systems There is no listed fever, chills, nausea, vomiting  Objective: Vital Signs: There were no vitals  taken for this visit.  Physical Exam He is alert and orient x3 and in no acute distress Ortho Exam Examination of the right hip shows some mild pain over the trochanteric area.  He has full internal and external rotation of the right hip with no pain in the groin.  The right knee shows significant patellofemoral crepitation but no effusion.  There is some mild medial lateral joint line tenderness but no instability on exam.  His Lachman's and McMurray's exams are negative. Specialty Comments:  No specialty comments available.  Imaging: XR HIP UNILAT W OR W/O PELVIS 2-3 VIEWS RIGHT  Result Date: 02/11/2022 An AP pelvis and lateral right hip shows mild  arthritic findings which is slight joint space narrowing and a small osteophyte.  XR Knee 1-2 Views Right  Result Date: 02/11/2022 2 views of the right knee show well-maintained medial lateral compartments.  There is narrowing of the patellofemoral joint.  There are changes in the metaphyseal area of the femur that are chronic and appear to be consistent with an enchondroma.  This is an incidental finding.    PMFS History: Patient Active Problem List   Diagnosis Date Noted   Poor posture 02/06/2022   Mid back pain 02/06/2022   Right foot pain 02/06/2022   Secondary hypercoagulable state ([place]) 03/28/2021   Ingrown toenail of right foot 03/23/2019   Current use of long term anticoagulation 01/20/2018   EtOH dependence ([place]) 07/30/2017   Paroxysmal atrial fibrillation ([place]) 07/29/2017   Varicose veins of left lower extremity with complications 91/47/8295   Greater trochanteric bursitis of right hip 01/02/2017   Prediabetes 09/20/2016   Rotator cuff syndrome of right shoulder 09/27/2015   Vitamin D deficiency 62/13/0865   Lichenoid keratosis 78/46/9629   Superficial thrombosis of leg 03/28/2013   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   Inguinal hernia recurrent unilateral 08/10/2009   History of colonic polyps 05/15/2008   Essential hypertension 05/15/2008   HYPERLIPIDEMIA 05/12/2007   Elevated prostate specific antigen (PSA) 05/12/2007   Past Medical History:  Diagnosis Date   Atrial fibrillation with RVR ([place])    Converted to NSR with lopressor   Benign prostatic hypertrophy with elevated PSA    Dr Alinda Money, High grade PIN w/o malignancy   History of colonic polyps 2003, 2007   hyperplastic   Hx of colonoscopy 2012   neg   Hyperlipidemia    Hypertension    Retinal vascular occlusion    Dr Delman Cheadle    Family History  Problem Relation Age of Onset   Alzheimer's disease Mother    Heart attack Father 68   Hypertension Brother    Hypertension Maternal Grandmother    Diabetes Neg Hx     Stroke Neg Hx    Colon cancer Neg Hx    Esophageal cancer Neg Hx    Pancreatic cancer Neg Hx    Rectal cancer Neg Hx    Stomach cancer Neg Hx     Past Surgical History:  Procedure Laterality Date   APPENDECTOMY     COLONOSCOPY W/ POLYPECTOMY      X2 , Dr Deatra Ina   ENDOVENOUS ABLATION SAPHENOUS VEIN W/ LASER Left 09/29/2017   endovenous laser ablation left greater saphenous vein by Tinnie Gens MD    HERNIA REPAIR  [id number]    Dr Margot Chimes   lichenoid keratosis resected  8/14   LIPOMA EXCISION     X 5   PROSTATE BIOPSY  09/2010   Dr Alinda Money   stab phlebectomy  Left  01/27/2018   stab phlebectomy 10-20 incisions left leg by Ruta Hinds MD   Social History   Occupational History   Not on file  Tobacco Use   Smoking status: Never   Smokeless tobacco: Never  Vaping Use   Vaping Use: Never used  Substance and Sexual Activity   Alcohol use: Yes    Alcohol/week: 3.0 standard drinks of alcohol    Types: 3 Glasses of wine [place] week    Comment: daily   Drug use: No   Sexual activity: Not on file

## 2022-02-17 NOTE — Therapy (Signed)
OUTPATIENT PHYSICAL THERAPY THORACOLUMBAR EVALUATION   Patient Name: [name] [name] MRN: [id number] DOB:June 01, 1950, 71 y.o., male Today's Date: 02/20/2022   PT End of Session - 02/20/22 1615     Visit Number 1    Date for PT Re-Evaluation 04/03/22    Authorization Type [employer]    PT Start Time 1615    PT Stop Time 1708    PT Time Calculation (min) 53 min    Activity Tolerance Patient tolerated treatment well    Behavior During Therapy WFL for tasks assessed/performed             Past Medical History:  Diagnosis Date   Atrial fibrillation with RVR ([place])    Converted to NSR with lopressor   Benign prostatic hypertrophy with elevated PSA    Dr Alinda Money, High grade PIN w/o malignancy   History of colonic polyps 2003, 2007   hyperplastic   Hx of colonoscopy 2012   neg   Hyperlipidemia    Hypertension    Retinal vascular occlusion    Dr Delman Cheadle   Past Surgical History:  Procedure Laterality Date   APPENDECTOMY     COLONOSCOPY W/ POLYPECTOMY      X2 , Dr Deatra Ina   ENDOVENOUS ABLATION SAPHENOUS VEIN W/ LASER Left 09/29/2017   endovenous laser ablation left greater saphenous vein by Tinnie Gens MD    HERNIA REPAIR  [id number]    Dr Margot Chimes   lichenoid keratosis resected  8/14   LIPOMA EXCISION     X 5   PROSTATE BIOPSY  09/2010   Dr Alinda Money   stab phlebectomy  Left 01/27/2018   stab phlebectomy 10-20 incisions left leg by Ruta Hinds MD   Patient Active Problem List   Diagnosis Date Noted   Poor posture 02/06/2022   Mid back pain 02/06/2022   Right foot pain 02/06/2022   Secondary hypercoagulable state ([place]) 03/28/2021   Ingrown toenail of right foot 03/23/2019   Current use of long term anticoagulation 01/20/2018   EtOH dependence ([place]) 07/30/2017   Paroxysmal atrial fibrillation ([place]) 07/29/2017   Varicose veins of left lower extremity with complications 56/38/7564   Greater trochanteric bursitis of right hip 01/02/2017   Prediabetes 09/20/2016   Rotator  cuff syndrome of right shoulder 09/27/2015   Vitamin D deficiency 33/29/5188   Lichenoid keratosis 41/66/0630   Superficial thrombosis of leg 03/28/2013   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   Inguinal hernia recurrent unilateral 08/10/2009   History of colonic polyps 05/15/2008   Essential hypertension 05/15/2008   HYPERLIPIDEMIA 05/12/2007   Elevated prostate specific antigen (PSA) 05/12/2007    PCP: Binnie Rail, MD   REFERRING PROVIDER: Binnie Rail, MD   REFERRING DIAG:  M54.9 (ICD-10-CM) - Mid back pain  R29.3 (ICD-10-CM) - Poor posture    THERAPY DIAG:  Abnormal posture  Pain in thoracic spine  Muscle weakness (generalized)  Muscle spasm of back  RATIONALE FOR EVALUATION AND TREATMENT: Rehabilitation  ONSET DATE: 2-3 months for thoracic back pain  NEXT MD VISIT: 04/01/2022   SUBJECTIVE:  SUBJECTIVE STATEMENT: Pt reports intermittent lower thoracic back pain after certain activities such as completing the leg press in the gym or sitting in an [place] chair but states this is not his primary concern. Pain has been improving - initially 8/10 when present but now more 3-4/10. Marland Kitchen He is most concerned about is posture wanting to avoid a "banana back" flexed posture.  PAIN:  Are you having pain? No and Yes: NPRS scale: 0/10 currently; typically 3-4/10 Pain location: lower thoracic back pain Pain description: tightness Aggravating factors: leg press at gym, sitting in [place] chair - pressure at mid back, sleeping on firm bed Relieving factors: typically resolves w/o intervention  PERTINENT HISTORY: Central blindness; HTN; paroxysmal afib with RVR; chronic anticoagulation; HLD; R foot pain; R greater trochanteric bursitis; R shoulder RTC syndrome; Prediabetes; ETOH  dependence  PRECAUTIONS: None  WEIGHT BEARING RESTRICTIONS: No  FALLS:  Has patient fallen in last 6 months? Yes. Number of falls 1 - slip while playing golf  LIVING ENVIRONMENT: Lives with: lives with their spouse Lives in: House/apartment Stairs: Yes: Internal: 14 steps; on left going up and External: 3-4 steps; none Has following equipment at home: Single point cane, Walker - 2 wheeled, and shower chair  OCCUPATION: Retired  PLOF: Independent and Leisure: golf, yard work, gym 3x/wk intermittently  PATIENT GOALS: "To avoid a stooped posture ("banana back")."   OBJECTIVE:   DIAGNOSTIC FINDINGS:  02/06/22 - Thoracic spine x-ray:  IMPRESSION: No fracture or dislocation of the thoracic spine. Normal thoracic kyphosis. Mild multilevel disc space height loss and osteophytosis with subtle bridging osteophytosis at multiple levels of the mid and lower thoracic spine suggesting DISH. 02/06/22 - Cervical spine x-ray: IMPRESSION: No fracture or static subluxation of the cervical spine. Moderate disc space height loss and osteophytosis from C5 through C7 with otherwise mild disc space height loss. Severe facet degenerative change at multiple levels, predominantly in the upper cervical spine. Associated bilateral bony neural foraminal stenosis as above. Cervical disc and neural foraminal pathology may be further evaluated by MRI if indicated by neurologically localizing signs and symptoms. 02/11/22 - R hip/pelvis x-ray: An AP pelvis and lateral right hip shows mild arthritic findings which is slight joint space narrowing and a small osteophyte. 02/11/22 - R knee x-ray: 2 views of the right knee show well-maintained medial lateral compartments.  There is narrowing of the patellofemoral joint.  There are changes in the metaphyseal area of the femur that are chronic and appear to be consistent with an enchondroma.  This is an incidental finding.  PATIENT SURVEYS:  Modified Oswestry 2 / 50 =  4.0%   SCREENING FOR RED FLAGS: Bowel or bladder incontinence: No Spinal tumors: No Cauda equina syndrome: No Compression fracture: No Abdominal aneurysm: No  COGNITION:  Overall cognitive status: Within functional limits for tasks assessed     SENSATION: WFL  MUSCLE LENGTH: Hamstrings: severe tight B ITB: mod tight B Piriformis: mild/mod tight B Hip flexors: mod/severe tight R>L Quads: mod tight R>L  POSTURE:  rounded shoulders, forward head, and increased thoracic kyphosis  PALPATION: Denies TTP in thoracic paraspinals or periscapular muscles  THORACOLUMBAR ROM:   Active  A/PROM  eval  Flexion Hands to knee - tight HS  Extension 50% limited  Right lateral flexion Hand to lower thigh  Left lateral flexion Hand to lower thigh  Right rotation WFL  Left rotation WFL   (Blank rows = not tested)  UPPER EXTREMITY ROM: WFL but L shoulder slightly more limited  than R  LOWER EXTREMITY ROM:    Limited due to musculature tightness  UPPER EXTREMITY MMT:  MMT Right eval Left eval  Shoulder flexion 5 5  Shoulder extension 5 5  Shoulder abduction 5 5  Shoulder adduction    Shoulder internal rotation 5 5  Shoulder external rotation 4 4  Middle trapezius    Lower trapezius    Elbow flexion    Elbow extension    Wrist flexion    Wrist extension    Wrist ulnar deviation    Wrist radial deviation    Wrist pronation    Wrist supination    Grip strength     (Blank rows = not tested)   LOWER EXTREMITY MMT:    MMT Right eval Left eval  Hip flexion 4+ 4+  Hip extension 4 4  Hip abduction 4 4  Hip adduction 4+ 4+  Hip internal rotation 4+ 4+  Hip external rotation 4+ 4+  Knee flexion 5 5  Knee extension 5 5  Ankle dorsiflexion 5 4  Ankle plantarflexion 5- 5-  Ankle inversion    Ankle eversion     (Blank rows = not tested)   TODAY'S TREATMENT:  02/20/22 THERAPEUTIC EXERCISE: to improve flexibility, strength and mobility.  Verbal and tactile cues  throughout for technique. B supine HS stretch with strap x 30" B mod thomas hip flexor stretch with strap x 30" Low and mid doorway pec stretches x 30" each; deferred high position due to L shoulder/upper arm pain Seated thoracic extension with arms behind head for pec stretch 10 x 5" Standing thoracolumbar extension at wall 10 x 5"   PATIENT EDUCATION:  Education details: PT eval findings, anticipated POC, and initial HEP Person educated: Patient Education method: Explanation, Demonstration, Tactile cues, Verbal cues, and Handouts Education comprehension: verbalized understanding, returned demonstration, verbal cues required, tactile cues required, and needs further education   HOME EXERCISE PROGRAM: Access Code: [email or web address] URL: [email or web address] Date: 02/20/2022 Prepared by: Annie Paras  Exercises - Hooklying Hamstring Stretch with Strap  - 2 x daily - 7 x weekly - 3 reps - 30 sec hold - Supine Quadriceps Stretch with Strap on Table  - 2 x daily - 7 x weekly - 3 reps - 30 sec hold - Doorway Pec Stretch at 60 Elevation  - 2 x daily - 7 x weekly - 3 reps - 30 sec hold - Doorway Pec Stretch at 90 Degrees Abduction  - 2 x daily - 7 x weekly - 3 reps - 30 sec hold - Seated Thoracic Lumbar Extension with Pectoralis Stretch  - 2 x daily - 7 x weekly - 2 sets - 10 reps - 5 sec hold - Standing Lumbar Extension at Wall - Forearms  - 2 x daily - 7 x weekly - 2 sets - 10 reps - 5 sec hold - Standing Thoracic Extension at Wall  - 2 x daily - 7 x weekly - 2 sets - 10 reps - 5 sec hold  ASSESSMENT:  CLINICAL IMPRESSION: Icholas ADELFO DIEBEL is a 71 y.o. male who was seen today for physical therapy evaluation and treatment for mid back pain and poor posture. He reports the lower thoracic back pain is intermittent and has been gradually improving and hence is not his primary concern. His main focus is that he does not want his posture to continue to progressively worsen to where he ends  up stooped over. Currently he demonstrates a forward head and rounded shoulder  posture with slightly increased kyphosis. Additional deficits include decreased thoracolumbar AROM, abnormal muscle tension/tightness in pecs as well as most proximal LE musculature, and core/postural weakness along with weakness in B shoulder ER and B proximal LE weakness. Postural deficits and restricted ROM/muscle tightness limit tolerance for prone postioning. Rontae will benefit from skilled PT to address above deficits to promote improved flexibility and postural alignment with decreased pain interference.  OBJECTIVE IMPAIRMENTS: decreased activity tolerance, decreased mobility, decreased strength, hypomobility, increased fascial restrictions, impaired perceived functional ability, increased muscle spasms, impaired flexibility, improper body mechanics, postural dysfunction, and pain.   ACTIVITY LIMITATIONS: sitting and sleeping  PARTICIPATION LIMITATIONS:  gym workout  PERSONAL FACTORS: Age, Fitness, Past/current experiences, Time since onset of injury/illness/exacerbation, and 3+ comorbidities: Central blindness; HTN; paroxysmal afib with RVR; chronic anticoagulation; HLD; R foot pain; R greater trochanteric bursitis; R shoulder RTC syndrome; Prediabetes; ETOH dependence  are also affecting patient's functional outcome.   REHAB POTENTIAL: Good  CLINICAL DECISION MAKING: Stable/uncomplicated  EVALUATION COMPLEXITY: Low   GOALS: Goals reviewed with patient? Yes  SHORT TERM GOALS: Target date: 03/13/2022   Patient will be independent with initial HEP to improve outcomes and carryover.  Baseline:  Goal status: INITIAL  2.  Patient to verbalize understanding good spinal alignment/posturing and body mechanics needed for daily activities. Baseline:  Goal status: INITIAL  LONG TERM GOALS: Target date: 04/03/2022    Patient will be independent with ongoing/advanced HEP for self-management at home.  Baseline:   Goal status: INITIAL  2.  Patient will report 50-75% improvement in mid back pain to improve QOL.  Baseline:  Goal status: INITIAL  3.  Patient to demonstrate ability to achieve and maintain good spinal alignment/posturing and body mechanics needed for daily activities. Baseline:  Goal status: INITIAL  4.  Patient will demonstrate functional pain free thoracolumbar ROM to perform ADLs.   Baseline:  Goal status: INITIAL  5.  Patient will demonstrate improved B proximal LE and B shoulder ER strength to >/= 4+ to 5-/5 for improved stability and ease of mobility . Baseline:  Goal status: INITIAL  6.  Patient will tolerate 30 min of sitting w/o increased mid back pain to improve comfort with daily positioning. Baseline:  Goal status: INITIAL   PLAN: PT FREQUENCY: 2x/week  PT DURATION: 6 weeks  PLANNED INTERVENTIONS: Therapeutic exercises, Therapeutic activity, Neuromuscular re-education, Balance training, Gait training, Patient/Family education, Self Care, Joint mobilization, Dry Needling, Electrical stimulation, Spinal mobilization, Cryotherapy, Moist heat, Taping, Traction, Ionotophoresis '4mg'$ /ml Dexamethasone, Manual therapy, and Re-evaluation.  PLAN FOR NEXT SESSION: Review initial HEP; progress postural and lumbopelvic flexibility and strengthening; MT and modalities as indicated   Percival Spanish, PT 02/20/2022, 7:13 PM

## 2022-02-20 ENCOUNTER — Ambulatory Visit: Payer: PPO | Attending: Internal Medicine | Admitting: Physical Therapy

## 2022-02-20 ENCOUNTER — Encounter: Payer: Self-pay | Admitting: Physical Therapy

## 2022-02-20 ENCOUNTER — Other Ambulatory Visit: Payer: Self-pay

## 2022-02-20 DIAGNOSIS — R293 Abnormal posture: Secondary | ICD-10-CM | POA: Insufficient documentation

## 2022-02-20 DIAGNOSIS — M549 Dorsalgia, unspecified: Secondary | ICD-10-CM | POA: Diagnosis not present

## 2022-02-20 DIAGNOSIS — R262 Difficulty in walking, not elsewhere classified: Secondary | ICD-10-CM | POA: Diagnosis not present

## 2022-02-20 DIAGNOSIS — M25551 Pain in right hip: Secondary | ICD-10-CM | POA: Insufficient documentation

## 2022-02-20 DIAGNOSIS — M546 Pain in thoracic spine: Secondary | ICD-10-CM | POA: Insufficient documentation

## 2022-02-20 DIAGNOSIS — M25562 Pain in left knee: Secondary | ICD-10-CM | POA: Insufficient documentation

## 2022-02-20 DIAGNOSIS — M6281 Muscle weakness (generalized): Secondary | ICD-10-CM | POA: Insufficient documentation

## 2022-02-20 DIAGNOSIS — M62830 Muscle spasm of back: Secondary | ICD-10-CM | POA: Diagnosis not present

## 2022-02-24 ENCOUNTER — Encounter: Payer: Self-pay | Admitting: Physical Therapy

## 2022-02-24 ENCOUNTER — Ambulatory Visit: Payer: PPO | Admitting: Physical Therapy

## 2022-02-24 DIAGNOSIS — M25551 Pain in right hip: Secondary | ICD-10-CM

## 2022-02-24 DIAGNOSIS — R262 Difficulty in walking, not elsewhere classified: Secondary | ICD-10-CM

## 2022-02-24 DIAGNOSIS — M25562 Pain in left knee: Secondary | ICD-10-CM

## 2022-02-24 DIAGNOSIS — M62830 Muscle spasm of back: Secondary | ICD-10-CM

## 2022-02-24 DIAGNOSIS — R293 Abnormal posture: Secondary | ICD-10-CM | POA: Diagnosis not present

## 2022-02-24 DIAGNOSIS — M546 Pain in thoracic spine: Secondary | ICD-10-CM

## 2022-02-24 DIAGNOSIS — M6281 Muscle weakness (generalized): Secondary | ICD-10-CM

## 2022-02-24 NOTE — Therapy (Signed)
OUTPATIENT PHYSICAL THERAPY TREATMENT   Patient Name: [name] [name] MRN: [id number] DOB:November 20, 1950, 71 y.o., male Today's Date: 02/24/2022   PT End of Session - 02/24/22 1020     Visit Number 2    Date for PT Re-Evaluation 04/03/22    Authorization Type [employer]    PT Start Time 1020    PT Stop Time 1104    PT Time Calculation (min) 44 min    Activity Tolerance Patient tolerated treatment well    Behavior During Therapy WFL for tasks assessed/performed             Past Medical History:  Diagnosis Date   Atrial fibrillation with RVR ([place])    Converted to NSR with lopressor   Benign prostatic hypertrophy with elevated PSA    Dr Alinda Money, High grade PIN w/o malignancy   History of colonic polyps 2003, 2007   hyperplastic   Hx of colonoscopy 2012   neg   Hyperlipidemia    Hypertension    Retinal vascular occlusion    Dr Delman Cheadle   Past Surgical History:  Procedure Laterality Date   APPENDECTOMY     COLONOSCOPY W/ POLYPECTOMY      X2 , Dr Deatra Ina   ENDOVENOUS ABLATION SAPHENOUS VEIN W/ LASER Left 09/29/2017   endovenous laser ablation left greater saphenous vein by Tinnie Gens MD    HERNIA REPAIR  [id number]    Dr Margot Chimes   lichenoid keratosis resected  8/14   LIPOMA EXCISION     X 5   PROSTATE BIOPSY  09/2010   Dr Alinda Money   stab phlebectomy  Left 01/27/2018   stab phlebectomy 10-20 incisions left leg by Ruta Hinds MD   Patient Active Problem List   Diagnosis Date Noted   Poor posture 02/06/2022   Mid back pain 02/06/2022   Right foot pain 02/06/2022   Secondary hypercoagulable state ([place]) 03/28/2021   Ingrown toenail of right foot 03/23/2019   Current use of long term anticoagulation 01/20/2018   EtOH dependence ([place]) 07/30/2017   Paroxysmal atrial fibrillation ([place]) 07/29/2017   Varicose veins of left lower extremity with complications 12/75/1700   Greater trochanteric bursitis of right hip 01/02/2017   Prediabetes 09/20/2016   Rotator cuff syndrome  of right shoulder 09/27/2015   Vitamin D deficiency 17/49/4496   Lichenoid keratosis 75/91/6384   Superficial thrombosis of leg 03/28/2013   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   Inguinal hernia recurrent unilateral 08/10/2009   History of colonic polyps 05/15/2008   Essential hypertension 05/15/2008   HYPERLIPIDEMIA 05/12/2007   Elevated prostate specific antigen (PSA) 05/12/2007    PCP: Binnie Rail, MD   REFERRING PROVIDER: Binnie Rail, MD   REFERRING DIAG:  M54.9 (ICD-10-CM) - Mid back pain  R29.3 (ICD-10-CM) - Poor posture    THERAPY DIAG:  Abnormal posture  Pain in thoracic spine  Muscle weakness (generalized)  Muscle spasm of back  Acute pain of left knee  Pain in right hip  Difficulty in walking, not elsewhere classified  RATIONALE FOR EVALUATION AND TREATMENT: Rehabilitation  ONSET DATE: 2-3 months for thoracic back pain  NEXT MD VISIT: 04/01/2022   SUBJECTIVE:  SUBJECTIVE STATEMENT: Pt reports the HEP seems to be ok but he is not sure he is performing the doorway pec stretches correctly.  PAIN:  Are you having pain? No  PERTINENT HISTORY: Central blindness; HTN; paroxysmal afib with RVR; chronic anticoagulation; HLD; R foot pain; R greater trochanteric bursitis; R shoulder RTC syndrome; Prediabetes; ETOH dependence  PRECAUTIONS: None  WEIGHT BEARING RESTRICTIONS: No  FALLS:  Has patient fallen in last 6 months? Yes. Number of falls 1 - slip while playing golf  LIVING ENVIRONMENT: Lives with: lives with their spouse Lives in: House/apartment Stairs: Yes: Internal: 14 steps; on left going up and External: 3-4 steps; none Has following equipment at home: Single point cane, Walker - 2 wheeled, and shower chair  OCCUPATION: Retired  PLOF: Independent and Leisure:  golf, yard work, gym 3x/wk intermittently  PATIENT GOALS: "To avoid a stooped posture ("banana back")."   OBJECTIVE:   DIAGNOSTIC FINDINGS:  02/06/22 - Thoracic spine x-ray:  IMPRESSION: No fracture or dislocation of the thoracic spine. Normal thoracic kyphosis. Mild multilevel disc space height loss and osteophytosis with subtle bridging osteophytosis at multiple levels of the mid and lower thoracic spine suggesting DISH. 02/06/22 - Cervical spine x-ray: IMPRESSION: No fracture or static subluxation of the cervical spine. Moderate disc space height loss and osteophytosis from C5 through C7 with otherwise mild disc space height loss. Severe facet degenerative change at multiple levels, predominantly in the upper cervical spine. Associated bilateral bony neural foraminal stenosis as above. Cervical disc and neural foraminal pathology may be further evaluated by MRI if indicated by neurologically localizing signs and symptoms. 02/11/22 - R hip/pelvis x-ray: An AP pelvis and lateral right hip shows mild arthritic findings which is slight joint space narrowing and a small osteophyte. 02/11/22 - R knee x-ray: 2 views of the right knee show well-maintained medial lateral compartments.  There is narrowing of the patellofemoral joint.  There are changes in the metaphyseal area of the femur that are chronic and appear to be consistent with an enchondroma.  This is an incidental finding.  PATIENT SURVEYS:  Modified Oswestry 2 / 50 = 4.0%   SCREENING FOR RED FLAGS: Bowel or bladder incontinence: No Spinal tumors: No Cauda equina syndrome: No Compression fracture: No Abdominal aneurysm: No  COGNITION:  Overall cognitive status: Within functional limits for tasks assessed     SENSATION: WFL  MUSCLE LENGTH: Hamstrings: severe tight B ITB: mod tight B Piriformis: mild/mod tight B Hip flexors: mod/severe tight R>L Quads: mod tight R>L  POSTURE:  rounded shoulders, forward head, and  increased thoracic kyphosis  PALPATION: Denies TTP in thoracic paraspinals or periscapular muscles  THORACOLUMBAR ROM:   Active  A/PROM  eval  Flexion Hands to knee - tight HS  Extension 50% limited  Right lateral flexion Hand to lower thigh  Left lateral flexion Hand to lower thigh  Right rotation WFL  Left rotation WFL   (Blank rows = not tested)  UPPER EXTREMITY ROM: WFL but L shoulder slightly more limited than R  LOWER EXTREMITY ROM:    Limited due to musculature tightness  UPPER EXTREMITY MMT:  MMT Right eval Left eval  Shoulder flexion 5 5  Shoulder extension 5 5  Shoulder abduction 5 5  Shoulder adduction    Shoulder internal rotation 5 5  Shoulder external rotation 4 4  Middle trapezius    Lower trapezius    Elbow flexion    Elbow extension    Wrist flexion    Wrist  extension    Wrist ulnar deviation    Wrist radial deviation    Wrist pronation    Wrist supination    Grip strength     (Blank rows = not tested)   LOWER EXTREMITY MMT:    MMT Right eval Left eval  Hip flexion 4+ 4+  Hip extension 4 4  Hip abduction 4 4  Hip adduction 4+ 4+  Hip internal rotation 4+ 4+  Hip external rotation 4+ 4+  Knee flexion 5 5  Knee extension 5 5  Ankle dorsiflexion 5 4  Ankle plantarflexion 5- 5-  Ankle inversion    Ankle eversion     (Blank rows = not tested)   TODAY'S TREATMENT:  02/24/22 THERAPEUTIC EXERCISE: to improve flexibility, strength and mobility.  Verbal and tactile cues throughout for technique. NuStep - L5 x 6 min Low and mid doorway pec stretches x 30" each; deferred high position due to L shoulder/upper arm pain Seated thoracic extension with arms behind head for pec stretch 10 x 5" Standing thoracolumbar extension at wall 10 x 5" B mod thomas hip flexor stretch with strap 3 x 30" - cues to keep knee below hip height B supine HS stretch with strap + DF for calf stretch 2 x 30" Standing GTB B scap retraction/depression + shoulder  row 10 x 3" Standing GTB B scap retraction/depression + shoulder extension to neutral 10 x 3" Standing GTB B scap retraction/depression + lat pull down 10 x 3" Standing GTB B scap retraction/depression + shoulder horiz ABD 10 x 3", back along wall or doorframe for postural reminder Standing GTB B scap retraction/depression + shoulder diagonal horiz ABD 10 x 3", back along wall or doorframe for postural reminder Standing GTB B scap retraction/depression + shoulder ER 10 x 3", back along wall or doorframe for postural reminder   02/20/22 THERAPEUTIC EXERCISE: to improve flexibility, strength and mobility.  Verbal and tactile cues throughout for technique. B supine HS stretch with strap x 30" B mod thomas hip flexor stretch with strap x 30" Low and mid doorway pec stretches x 30" each; deferred high position due to L shoulder/upper arm pain Seated thoracic extension with arms behind head for pec stretch 10 x 5" Standing thoracolumbar extension at wall 10 x 5"   PATIENT EDUCATION:  Education details: PT eval findings, anticipated POC, and initial HEP Person educated: Patient Education method: Explanation, Demonstration, Tactile cues, Verbal cues, and Handouts Education comprehension: verbalized understanding, returned demonstration, verbal cues required, tactile cues required, and needs further education   HOME EXERCISE PROGRAM: Access Code: [email or web address] URL: [email or web address] Date: 02/24/2022 Prepared by: Annie Paras  Exercises - Hooklying Hamstring Stretch with Strap  - 2 x daily - 7 x weekly - 3 reps - 30 sec hold - Supine Quadriceps Stretch with Strap on Table  - 2 x daily - 7 x weekly - 3 reps - 30 sec hold - Doorway Pec Stretch at 60 Elevation  - 2 x daily - 7 x weekly - 3 reps - 30 sec hold - Doorway Pec Stretch at 90 Degrees Abduction  - 2 x daily - 7 x weekly - 3 reps - 30 sec hold - Seated Thoracic Lumbar Extension with Pectoralis Stretch  - 2 x daily - 7 x  weekly - 2 sets - 10 reps - 5 sec hold - Standing Lumbar Extension at [place]  - 2 x daily - 7 x weekly - 2 sets - 10 reps - 5  sec hold - Standing Thoracic Extension at Wall  - 2 x daily - 7 x weekly - 2 sets - 10 reps - 5 sec hold - Standing Bilateral Low Shoulder Row with Anchored Resistance  - 1 x daily - 3 x weekly - 2 sets - 10 reps - 5 sec hold - Scapular Retraction with Resistance Advanced  - 1 x daily - 3 x weekly - 2 sets - 10 reps - 5 sec hold - Standing Lat Pull Down with Resistance - Elbows Bent  - 1 x daily - 3 x weekly - 2 sets - 10 reps - 3 sec hold - Standing Shoulder Horizontal Abduction with Resistance  - 1 x daily - 3 x weekly - 2 sets - 10 reps - 3 sec hold - Standing Shoulder Diagonal Horizontal Abduction 60/120 Degrees with Resistance  - 1 x daily - 3 x weekly - 2 sets - 10 reps - 3 sec hold - Shoulder External Rotation and Scapular Retraction with Resistance  - 1 x daily - 3 x weekly - 2 sets - 10 reps - 3-5 sec hold  ASSESSMENT:  CLINICAL IMPRESSION: Melvyn requesting clarification of the doorway pec stretch - cues provided for alignment, posture and motion to create stretch. Remaining HEP stretches/exercises reviewed with clarification also necessary for thoracic extension exercises and mod thomas quad/hip flexor stretches, but all exercises well tolerated. Introduced postural strengthening with GTB resisted scapular retraction exercises emphasizing retraction + depression, avoiding shoulder shrug or forward rounding of shoulders. HEP updated with qod frequency for strengthening exercises.  OBJECTIVE IMPAIRMENTS: decreased activity tolerance, decreased mobility, decreased strength, hypomobility, increased fascial restrictions, impaired perceived functional ability, increased muscle spasms, impaired flexibility, improper body mechanics, postural dysfunction, and pain.   ACTIVITY LIMITATIONS: sitting and sleeping  PARTICIPATION LIMITATIONS:  gym workout  PERSONAL  FACTORS: Age, Fitness, Past/current experiences, Time since onset of injury/illness/exacerbation, and 3+ comorbidities: Central blindness; HTN; paroxysmal afib with RVR; chronic anticoagulation; HLD; R foot pain; R greater trochanteric bursitis; R shoulder RTC syndrome; Prediabetes; ETOH dependence  are also affecting patient's functional outcome.   REHAB POTENTIAL: Good  CLINICAL DECISION MAKING: Stable/uncomplicated  EVALUATION COMPLEXITY: Low   GOALS: Goals reviewed with patient? Yes  SHORT TERM GOALS: Target date: 03/13/2022   Patient will be independent with initial HEP to improve outcomes and carryover.  Baseline:  Goal status: IN PROGRESS  2.  Patient to verbalize understanding good spinal alignment/posturing and body mechanics needed for daily activities. Baseline:  Goal status: IN PROGRESS  LONG TERM GOALS: Target date: 04/03/2022    Patient will be independent with ongoing/advanced HEP for self-management at home.  Baseline:  Goal status: IN PROGRESS  2.  Patient will report 50-75% improvement in mid back pain to improve QOL.  Baseline:  Goal status: IN PROGRESS  3.  Patient to demonstrate ability to achieve and maintain good spinal alignment/posturing and body mechanics needed for daily activities. Baseline:  Goal status: IN PROGRESS  4.  Patient will demonstrate functional pain free thoracolumbar ROM to perform ADLs.   Baseline:  Goal status: IN PROGRESS  5.  Patient will demonstrate improved B proximal LE and B shoulder ER strength to >/= 4+ to 5-/5 for improved stability and ease of mobility . Baseline:  Goal status: IN PROGRESS  6.  Patient will tolerate 30 min of sitting w/o increased mid back pain to improve comfort with daily positioning. Baseline:  Goal status: IN PROGRESS   PLAN: PT FREQUENCY: 2x/week  PT DURATION:  6 weeks  PLANNED INTERVENTIONS: Therapeutic exercises, Therapeutic activity, Neuromuscular re-education, Balance training, Gait  training, Patient/Family education, Self Care, Joint mobilization, Dry Needling, Electrical stimulation, Spinal mobilization, Cryotherapy, Moist heat, Taping, Traction, Ionotophoresis '4mg'$ /ml Dexamethasone, Manual therapy, and Re-evaluation.  PLAN FOR NEXT SESSION: progress postural and lumbopelvic flexibility and strengthening; review and update HEP as indicated; MT and modalities as indicated   Percival Spanish, PT 02/24/2022, 12:15 PM

## 2022-02-27 ENCOUNTER — Encounter: Payer: Self-pay | Admitting: Physical Therapy

## 2022-02-27 ENCOUNTER — Ambulatory Visit: Payer: PPO | Admitting: Physical Therapy

## 2022-02-27 DIAGNOSIS — M62830 Muscle spasm of back: Secondary | ICD-10-CM

## 2022-02-27 DIAGNOSIS — M6281 Muscle weakness (generalized): Secondary | ICD-10-CM

## 2022-02-27 DIAGNOSIS — M546 Pain in thoracic spine: Secondary | ICD-10-CM

## 2022-02-27 DIAGNOSIS — R293 Abnormal posture: Secondary | ICD-10-CM

## 2022-02-27 NOTE — Therapy (Signed)
OUTPATIENT PHYSICAL THERAPY TREATMENT   Patient Name: [name] [name] MRN: [id number] DOB:[birth year], 71 y.o., male Today's Date: 02/27/2022   PT End of Session - 02/27/22 1610     Visit Number 3    Date for PT Re-Evaluation 04/03/22    Authorization Type [employer]    PT Start Time 1610    PT Stop Time 1654    PT Time Calculation (min) 44 min    Activity Tolerance Patient tolerated treatment well    Behavior During Therapy WFL for tasks assessed/performed              Past Medical History:  Diagnosis Date   Atrial fibrillation with RVR ([place])    Converted to NSR with lopressor   Benign prostatic hypertrophy with elevated PSA    Dr Alinda Money, High grade PIN w/o malignancy   History of colonic polyps 2003, 2007   hyperplastic   Hx of colonoscopy 2012   neg   Hyperlipidemia    Hypertension    Retinal vascular occlusion    Dr Delman Cheadle   Past Surgical History:  Procedure Laterality Date   APPENDECTOMY     COLONOSCOPY W/ POLYPECTOMY      X2 , Dr Deatra Ina   ENDOVENOUS ABLATION SAPHENOUS VEIN W/ LASER Left 09/29/2017   endovenous laser ablation left greater saphenous vein by Tinnie Gens MD    HERNIA REPAIR  [id number]    Dr Margot Chimes   lichenoid keratosis resected  8/14   LIPOMA EXCISION     X 5   PROSTATE BIOPSY  09/2010   Dr Alinda Money   stab phlebectomy  Left 01/27/2018   stab phlebectomy 10-20 incisions left leg by Ruta Hinds MD   Patient Active Problem List   Diagnosis Date Noted   Poor posture 02/06/2022   Mid back pain 02/06/2022   Right foot pain 02/06/2022   Secondary hypercoagulable state ([place]) 03/28/2021   Ingrown toenail of right foot 03/23/2019   Current use of long term anticoagulation 01/20/2018   EtOH dependence ([place]) 07/30/2017   Paroxysmal atrial fibrillation ([place]) 07/29/2017   Varicose veins of left lower extremity with complications 73/42/8768   Greater trochanteric bursitis of right hip 01/02/2017   Prediabetes 09/20/2016   Rotator cuff  syndrome of right shoulder 09/27/2015   Vitamin D deficiency 11/57/2620   Lichenoid keratosis 35/59/7416   Superficial thrombosis of leg 03/28/2013   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   Inguinal hernia recurrent unilateral 08/10/2009   History of colonic polyps 05/15/2008   Essential hypertension 05/15/2008   HYPERLIPIDEMIA 05/12/2007   Elevated prostate specific antigen (PSA) 05/12/2007    PCP: Binnie Rail, MD   REFERRING PROVIDER: Binnie Rail, MD   REFERRING DIAG:  M54.9 (ICD-10-CM) - Mid back pain  R29.3 (ICD-10-CM) - Poor posture    THERAPY DIAG:  Abnormal posture  Pain in thoracic spine  Muscle weakness (generalized)  Muscle spasm of back  RATIONALE FOR EVALUATION AND TREATMENT: Rehabilitation  ONSET DATE: 2-3 months for thoracic back pain  NEXT MD VISIT: 04/01/2022   SUBJECTIVE:  SUBJECTIVE STATEMENT: Pt requesting review of HEP to ensure he is performing the exercises correctly at home.  PAIN:  Are you having pain? No  PERTINENT HISTORY: Central blindness; HTN; paroxysmal afib with RVR; chronic anticoagulation; HLD; R foot pain; R greater trochanteric bursitis; R shoulder RTC syndrome; Prediabetes; ETOH dependence  PRECAUTIONS: None  WEIGHT BEARING RESTRICTIONS: No  FALLS:  Has patient fallen in last 6 months? Yes. Number of falls 1 - slip while playing golf  LIVING ENVIRONMENT: Lives with: lives with their spouse Lives in: House/apartment Stairs: Yes: Internal: 14 steps; on left going up and External: 3-4 steps; none Has following equipment at home: Single point cane, Walker - 2 wheeled, and shower chair  OCCUPATION: Retired  PLOF: Independent and Leisure: golf, yard work, gym 3x/wk intermittently  PATIENT GOALS: "To avoid a stooped posture ("banana  back")."   OBJECTIVE:   DIAGNOSTIC FINDINGS:  02/06/22 - Thoracic spine x-ray:  IMPRESSION: No fracture or dislocation of the thoracic spine. Normal thoracic kyphosis. Mild multilevel disc space height loss and osteophytosis with subtle bridging osteophytosis at multiple levels of the mid and lower thoracic spine suggesting DISH. 02/06/22 - Cervical spine x-ray: IMPRESSION: No fracture or static subluxation of the cervical spine. Moderate disc space height loss and osteophytosis from C5 through C7 with otherwise mild disc space height loss. Severe facet degenerative change at multiple levels, predominantly in the upper cervical spine. Associated bilateral bony neural foraminal stenosis as above. Cervical disc and neural foraminal pathology may be further evaluated by MRI if indicated by neurologically localizing signs and symptoms. 02/11/22 - R hip/pelvis x-ray: An AP pelvis and lateral right hip shows mild arthritic findings which is slight joint space narrowing and a small osteophyte. 02/11/22 - R knee x-ray: 2 views of the right knee show well-maintained medial lateral compartments.  There is narrowing of the patellofemoral joint.  There are changes in the metaphyseal area of the femur that are chronic and appear to be consistent with an enchondroma.  This is an incidental finding.  PATIENT SURVEYS:  Modified Oswestry 2 / 50 = 4.0%   SCREENING FOR RED FLAGS: Bowel or bladder incontinence: No Spinal tumors: No Cauda equina syndrome: No Compression fracture: No Abdominal aneurysm: No  COGNITION:  Overall cognitive status: Within functional limits for tasks assessed     SENSATION: WFL  MUSCLE LENGTH: Hamstrings: severe tight B ITB: mod tight B Piriformis: mild/mod tight B Hip flexors: mod/severe tight R>L Quads: mod tight R>L  POSTURE:  rounded shoulders, forward head, and increased thoracic kyphosis  PALPATION: Denies TTP in thoracic paraspinals or periscapular  muscles  THORACOLUMBAR ROM:   Active  A/PROM  eval  Flexion Hands to knee - tight HS  Extension 50% limited  Right lateral flexion Hand to lower thigh  Left lateral flexion Hand to lower thigh  Right rotation WFL  Left rotation WFL   (Blank rows = not tested)  UPPER EXTREMITY ROM: WFL but L shoulder slightly more limited than R  LOWER EXTREMITY ROM:    Limited due to musculature tightness  UPPER EXTREMITY MMT:  MMT Right eval Left eval  Shoulder flexion 5 5  Shoulder extension 5 5  Shoulder abduction 5 5  Shoulder adduction    Shoulder internal rotation 5 5  Shoulder external rotation 4 4  Middle trapezius    Lower trapezius    Elbow flexion    Elbow extension    Wrist flexion    Wrist extension    Wrist ulnar  deviation    Wrist radial deviation    Wrist pronation    Wrist supination    Grip strength     (Blank rows = not tested)   LOWER EXTREMITY MMT:    MMT Right eval Left eval  Hip flexion 4+ 4+  Hip extension 4 4  Hip abduction 4 4  Hip adduction 4+ 4+  Hip internal rotation 4+ 4+  Hip external rotation 4+ 4+  Knee flexion 5 5  Knee extension 5 5  Ankle dorsiflexion 5 4  Ankle plantarflexion 5- 5-  Ankle inversion    Ankle eversion     (Blank rows = not tested)   TODAY'S TREATMENT:  02/27/22 THERAPEUTIC EXERCISE: to improve flexibility, strength and mobility.  Verbal and tactile cues throughout for technique. UBE - L2.5 x 6 min (3' each fwd & back) Low and mid doorway pec stretches x 30" each - cues to increase step length rather than lean fwd to increase stretch Prone over green Pball - I's, T's, Y' & W's x 10 each - PT stabilizing ball BATCA low row 35# 2 x 10 TRX low row x 10 Wall pushups 2 x 10   02/24/22 THERAPEUTIC EXERCISE: to improve flexibility, strength and mobility.  Verbal and tactile cues throughout for technique. NuStep - L5 x 6 min Low and mid doorway pec stretches x 30" each; deferred high position due to L  shoulder/upper arm pain Seated thoracic extension with arms behind head for pec stretch 10 x 5" Standing thoracolumbar extension at wall 10 x 5" B mod thomas hip flexor stretch with strap 3 x 30" - cues to keep knee below hip height B supine HS stretch with strap + DF for calf stretch 2 x 30" Standing GTB B scap retraction/depression + shoulder row 10 x 3" Standing GTB B scap retraction/depression + shoulder extension to neutral 10 x 3" Standing GTB B scap retraction/depression + lat pull down 10 x 3" Standing GTB B scap retraction/depression + shoulder horiz ABD 10 x 3", back along wall or doorframe for postural reminder Standing GTB B scap retraction/depression + shoulder diagonal horiz ABD 10 x 3", back along wall or doorframe for postural reminder Standing GTB B scap retraction/depression + shoulder ER 10 x 3", back along wall or doorframe for postural reminder   02/20/22 THERAPEUTIC EXERCISE: to improve flexibility, strength and mobility.  Verbal and tactile cues throughout for technique. B supine HS stretch with strap x 30" B mod thomas hip flexor stretch with strap x 30" Low and mid doorway pec stretches x 30" each; deferred high position due to L shoulder/upper arm pain Seated thoracic extension with arms behind head for pec stretch 10 x 5" Standing thoracolumbar extension at wall 10 x 5"   PATIENT EDUCATION:  Education details: PT eval findings, anticipated POC, and initial HEP Person educated: Patient Education method: Explanation, Demonstration, Tactile cues, Verbal cues, and Handouts Education comprehension: verbalized understanding, returned demonstration, verbal cues required, tactile cues required, and needs further education   HOME EXERCISE PROGRAM: Access Code: [email or web address] URL: [email or web address] Date: 02/24/2022 Prepared by: Annie Paras  Exercises - Hooklying Hamstring Stretch with Strap  - 2 x daily - 7 x weekly - 3 reps - 30 sec hold - Supine  Quadriceps Stretch with Strap on Table  - 2 x daily - 7 x weekly - 3 reps - 30 sec hold - Doorway Pec Stretch at 60 Elevation  - 2 x daily - 7 x weekly - 3 reps -  30 sec hold - Doorway Pec Stretch at 90 Degrees Abduction  - 2 x daily - 7 x weekly - 3 reps - 30 sec hold - Seated Thoracic Lumbar Extension with Pectoralis Stretch  - 2 x daily - 7 x weekly - 2 sets - 10 reps - 5 sec hold - Standing Lumbar Extension at Wall - Forearms  - 2 x daily - 7 x weekly - 2 sets - 10 reps - 5 sec hold - Standing Thoracic Extension at Wall  - 2 x daily - 7 x weekly - 2 sets - 10 reps - 5 sec hold - Standing Bilateral Low Shoulder Row with Anchored Resistance  - 1 x daily - 3 x weekly - 2 sets - 10 reps - 5 sec hold - Scapular Retraction with Resistance Advanced  - 1 x daily - 3 x weekly - 2 sets - 10 reps - 5 sec hold - Standing Lat Pull Down with Resistance - Elbows Bent  - 1 x daily - 3 x weekly - 2 sets - 10 reps - 3 sec hold - Standing Shoulder Horizontal Abduction with Resistance  - 1 x daily - 3 x weekly - 2 sets - 10 reps - 3 sec hold - Standing Shoulder Diagonal Horizontal Abduction 60/120 Degrees with Resistance  - 1 x daily - 3 x weekly - 2 sets - 10 reps - 3 sec hold - Shoulder External Rotation and Scapular Retraction with Resistance  - 1 x daily - 3 x weekly - 2 sets - 10 reps - 3-5 sec hold  ASSESSMENT:  CLINICAL IMPRESSION: Leaf notes that he feels it is easier to achieve and maintain upright posture since he has been working on his HEP. We verbally reviewed several of his HEP exercises providing clarification as necessary, with pt verbalizing better understanding following review. Introduced gravity resisted posture strengthening with prone exercises over green Pball - good upper body muscle engagement but assist necessary to keep the ball from rolling/sliding. We reviewed his typical gym workout, providing clarification of exercises targeting more of the postural muscle and modifications including  things such as moving the chest pad out of the way during rows to better engage core and postural muscles.   OBJECTIVE IMPAIRMENTS: decreased activity tolerance, decreased mobility, decreased strength, hypomobility, increased fascial restrictions, impaired perceived functional ability, increased muscle spasms, impaired flexibility, improper body mechanics, postural dysfunction, and pain.   ACTIVITY LIMITATIONS: sitting and sleeping  PARTICIPATION LIMITATIONS:  gym workout  PERSONAL FACTORS: Age, Fitness, Past/current experiences, Time since onset of injury/illness/exacerbation, and 3+ comorbidities: Central blindness; HTN; paroxysmal afib with RVR; chronic anticoagulation; HLD; R foot pain; R greater trochanteric bursitis; R shoulder RTC syndrome; Prediabetes; ETOH dependence  are also affecting patient's functional outcome.   REHAB POTENTIAL: Good  CLINICAL DECISION MAKING: Stable/uncomplicated  EVALUATION COMPLEXITY: Low   GOALS: Goals reviewed with patient? Yes  SHORT TERM GOALS: Target date: 03/13/2022   Patient will be independent with initial HEP to improve outcomes and carryover.  Baseline:  Goal status: IN PROGRESS  2.  Patient to verbalize understanding good spinal alignment/posturing and body mechanics needed for daily activities. Baseline:  Goal status: IN PROGRESS  LONG TERM GOALS: Target date: 04/03/2022    Patient will be independent with ongoing/advanced HEP for self-management at home.  Baseline:  Goal status: IN PROGRESS  2.  Patient will report 50-75% improvement in mid back pain to improve QOL.  Baseline:  Goal status: IN PROGRESS  3.  Patient  to demonstrate ability to achieve and maintain good spinal alignment/posturing and body mechanics needed for daily activities. Baseline:  Goal status: IN PROGRESS  4.  Patient will demonstrate functional pain free thoracolumbar ROM to perform ADLs.   Baseline:  Goal status: IN PROGRESS  5.  Patient will  demonstrate improved B proximal LE and B shoulder ER strength to >/= 4+ to 5-/5 for improved stability and ease of mobility . Baseline:  Goal status: IN PROGRESS  6.  Patient will tolerate 30 min of sitting w/o increased mid back pain to improve comfort with daily positioning. Baseline:  Goal status: IN PROGRESS   PLAN: PT FREQUENCY: 2x/week  PT DURATION: 6 weeks  PLANNED INTERVENTIONS: Therapeutic exercises, Therapeutic activity, Neuromuscular re-education, Balance training, Gait training, Patient/Family education, Self Care, Joint mobilization, Dry Needling, Electrical stimulation, Spinal mobilization, Cryotherapy, Moist heat, Taping, Traction, Ionotophoresis '4mg'$ /ml Dexamethasone, Manual therapy, and Re-evaluation.  PLAN FOR NEXT SESSION: progress postural and lumbopelvic flexibility and strengthening - adding rotational movement - open book and/or thread and reach; review and update HEP as indicated; MT and modalities as indicated   Percival Spanish, PT 02/27/2022, 5:00 PM

## 2022-03-04 ENCOUNTER — Ambulatory Visit: Payer: PPO

## 2022-03-04 DIAGNOSIS — R293 Abnormal posture: Secondary | ICD-10-CM | POA: Diagnosis not present

## 2022-03-04 DIAGNOSIS — M546 Pain in thoracic spine: Secondary | ICD-10-CM

## 2022-03-04 DIAGNOSIS — M6281 Muscle weakness (generalized): Secondary | ICD-10-CM

## 2022-03-04 DIAGNOSIS — M62830 Muscle spasm of back: Secondary | ICD-10-CM

## 2022-03-04 NOTE — Therapy (Signed)
OUTPATIENT PHYSICAL THERAPY TREATMENT   Patient Name: [name] [name] MRN: [id number] DOB:09-20-1950, 71 y.o., male Today's Date: 03/04/2022   PT End of Session - 03/04/22 1324     Visit Number 4    Date for PT Re-Evaluation 04/03/22    Authorization Type [employer]    PT Start Time 1317    PT Stop Time 1359    PT Time Calculation (min) 42 min    Activity Tolerance Patient tolerated treatment well    Behavior During Therapy WFL for tasks assessed/performed               Past Medical History:  Diagnosis Date   Atrial fibrillation with RVR ([place])    Converted to NSR with lopressor   Benign prostatic hypertrophy with elevated PSA    Dr Alinda Money, High grade PIN w/o malignancy   History of colonic polyps 2003, 2007   hyperplastic   Hx of colonoscopy 2012   neg   Hyperlipidemia    Hypertension    Retinal vascular occlusion    Dr Delman Cheadle   Past Surgical History:  Procedure Laterality Date   APPENDECTOMY     COLONOSCOPY W/ POLYPECTOMY      X2 , Dr Deatra Ina   ENDOVENOUS ABLATION SAPHENOUS VEIN W/ LASER Left 09/29/2017   endovenous laser ablation left greater saphenous vein by Tinnie Gens MD    HERNIA REPAIR  [id number]    Dr Margot Chimes   lichenoid keratosis resected  8/14   LIPOMA EXCISION     X 5   PROSTATE BIOPSY  09/2010   Dr Alinda Money   stab phlebectomy  Left 01/27/2018   stab phlebectomy 10-20 incisions left leg by Ruta Hinds MD   Patient Active Problem List   Diagnosis Date Noted   Poor posture 02/06/2022   Mid back pain 02/06/2022   Right foot pain 02/06/2022   Secondary hypercoagulable state ([place]) 03/28/2021   Ingrown toenail of right foot 03/23/2019   Current use of long term anticoagulation 01/20/2018   EtOH dependence ([place]) 07/30/2017   Paroxysmal atrial fibrillation ([place]) 07/29/2017   Varicose veins of left lower extremity with complications 88/91/6945   Greater trochanteric bursitis of right hip 01/02/2017   Prediabetes 09/20/2016   Rotator cuff  syndrome of right shoulder 09/27/2015   Vitamin D deficiency 03/88/8280   Lichenoid keratosis 03/49/1791   Superficial thrombosis of leg 03/28/2013   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   Inguinal hernia recurrent unilateral 08/10/2009   History of colonic polyps 05/15/2008   Essential hypertension 05/15/2008   HYPERLIPIDEMIA 05/12/2007   Elevated prostate specific antigen (PSA) 05/12/2007    PCP: Binnie Rail, MD   REFERRING PROVIDER: Binnie Rail, MD   REFERRING DIAG:  M54.9 (ICD-10-CM) - Mid back pain  R29.3 (ICD-10-CM) - Poor posture    THERAPY DIAG:  Abnormal posture  Pain in thoracic spine  Muscle weakness (generalized)  Muscle spasm of back  RATIONALE FOR EVALUATION AND TREATMENT: Rehabilitation  ONSET DATE: 2-3 months for thoracic back pain  NEXT MD VISIT: 04/01/2022   SUBJECTIVE:  SUBJECTIVE STATEMENT: Exercises feels good. No pain.  PAIN:  Are you having pain? No  PERTINENT HISTORY: Central blindness; HTN; paroxysmal afib with RVR; chronic anticoagulation; HLD; R foot pain; R greater trochanteric bursitis; R shoulder RTC syndrome; Prediabetes; ETOH dependence  PRECAUTIONS: None  WEIGHT BEARING RESTRICTIONS: No  FALLS:  Has patient fallen in last 6 months? Yes. Number of falls 1 - slip while playing golf  LIVING ENVIRONMENT: Lives with: lives with their spouse Lives in: House/apartment Stairs: Yes: Internal: 14 steps; on left going up and External: 3-4 steps; none Has following equipment at home: Single point cane, Walker - 2 wheeled, and shower chair  OCCUPATION: Retired  PLOF: Independent and Leisure: golf, yard work, gym 3x/wk intermittently  PATIENT GOALS: "To avoid a stooped posture ("banana back")."   OBJECTIVE:   DIAGNOSTIC FINDINGS:  02/06/22 -  Thoracic spine x-ray:  IMPRESSION: No fracture or dislocation of the thoracic spine. Normal thoracic kyphosis. Mild multilevel disc space height loss and osteophytosis with subtle bridging osteophytosis at multiple levels of the mid and lower thoracic spine suggesting DISH. 02/06/22 - Cervical spine x-ray: IMPRESSION: No fracture or static subluxation of the cervical spine. Moderate disc space height loss and osteophytosis from C5 through C7 with otherwise mild disc space height loss. Severe facet degenerative change at multiple levels, predominantly in the upper cervical spine. Associated bilateral bony neural foraminal stenosis as above. Cervical disc and neural foraminal pathology may be further evaluated by MRI if indicated by neurologically localizing signs and symptoms. 02/11/22 - R hip/pelvis x-ray: An AP pelvis and lateral right hip shows mild arthritic findings which is slight joint space narrowing and a small osteophyte. 02/11/22 - R knee x-ray: 2 views of the right knee show well-maintained medial lateral compartments.  There is narrowing of the patellofemoral joint.  There are changes in the metaphyseal area of the femur that are chronic and appear to be consistent with an enchondroma.  This is an incidental finding.  PATIENT SURVEYS:  Modified Oswestry 2 / 50 = 4.0%   SCREENING FOR RED FLAGS: Bowel or bladder incontinence: No Spinal tumors: No Cauda equina syndrome: No Compression fracture: No Abdominal aneurysm: No  COGNITION:  Overall cognitive status: Within functional limits for tasks assessed     SENSATION: WFL  MUSCLE LENGTH: Hamstrings: severe tight B ITB: mod tight B Piriformis: mild/mod tight B Hip flexors: mod/severe tight R>L Quads: mod tight R>L  POSTURE:  rounded shoulders, forward head, and increased thoracic kyphosis  PALPATION: Denies TTP in thoracic paraspinals or periscapular muscles  THORACOLUMBAR ROM:   Active  A/PROM  eval  Flexion Hands  to knee - tight HS  Extension 50% limited  Right lateral flexion Hand to lower thigh  Left lateral flexion Hand to lower thigh  Right rotation WFL  Left rotation WFL   (Blank rows = not tested)  UPPER EXTREMITY ROM: WFL but L shoulder slightly more limited than R  LOWER EXTREMITY ROM:    Limited due to musculature tightness  UPPER EXTREMITY MMT:  MMT Right eval Left eval  Shoulder flexion 5 5  Shoulder extension 5 5  Shoulder abduction 5 5  Shoulder adduction    Shoulder internal rotation 5 5  Shoulder external rotation 4 4  Middle trapezius    Lower trapezius    Elbow flexion    Elbow extension    Wrist flexion    Wrist extension    Wrist ulnar deviation    Wrist radial deviation  Wrist pronation    Wrist supination    Grip strength     (Blank rows = not tested)   LOWER EXTREMITY MMT:    MMT Right eval Left eval  Hip flexion 4+ 4+  Hip extension 4 4  Hip abduction 4 4  Hip adduction 4+ 4+  Hip internal rotation 4+ 4+  Hip external rotation 4+ 4+  Knee flexion 5 5  Knee extension 5 5  Ankle dorsiflexion 5 4  Ankle plantarflexion 5- 5-  Ankle inversion    Ankle eversion     (Blank rows = not tested)   TODAY'S TREATMENT: 03/04/22 Therapeutic Exercise: UBE L2 3 min fwd/ 3 min back BATCA low row 35# 2x10 Low and mid pec stretch in doorway x 30 sec BATCA lat pull 25# 2x10 Wall angels x 10 Wall push ups x 15 TRX rows x 10  Sidelying open books 10x 5 sec hold bil  02/27/22 THERAPEUTIC EXERCISE: to improve flexibility, strength and mobility.  Verbal and tactile cues throughout for technique. UBE - L2.5 x 6 min (3' each fwd & back) Low and mid doorway pec stretches x 30" each - cues to increase step length rather than lean fwd to increase stretch Prone over green Pball - I's, T's, Y' & W's x 10 each - PT stabilizing ball BATCA low row 35# 2 x 10 TRX low row x 10 Wall pushups 2 x 10   02/24/22 THERAPEUTIC EXERCISE: to improve flexibility,  strength and mobility.  Verbal and tactile cues throughout for technique. NuStep - L5 x 6 min Low and mid doorway pec stretches x 30" each; deferred high position due to L shoulder/upper arm pain Seated thoracic extension with arms behind head for pec stretch 10 x 5" Standing thoracolumbar extension at wall 10 x 5" B mod thomas hip flexor stretch with strap 3 x 30" - cues to keep knee below hip height B supine HS stretch with strap + DF for calf stretch 2 x 30" Standing GTB B scap retraction/depression + shoulder row 10 x 3" Standing GTB B scap retraction/depression + shoulder extension to neutral 10 x 3" Standing GTB B scap retraction/depression + lat pull down 10 x 3" Standing GTB B scap retraction/depression + shoulder horiz ABD 10 x 3", back along wall or doorframe for postural reminder Standing GTB B scap retraction/depression + shoulder diagonal horiz ABD 10 x 3", back along wall or doorframe for postural reminder Standing GTB B scap retraction/depression + shoulder ER 10 x 3", back along wall or doorframe for postural reminder      PATIENT EDUCATION:  Education details: HEP update - open books Person educated: Patient Education method: Explanation, Demonstration, Tactile cues, Verbal cues, and Handouts Education comprehension: verbalized understanding, returned demonstration, verbal cues required, tactile cues required, and needs further education   HOME EXERCISE PROGRAM: Access Code: [email or web address] URL: [email or web address] Date: 03/04/2022 Prepared by: Clarene Essex  Exercises - Hooklying Hamstring Stretch with Strap  - 2 x daily - 7 x weekly - 3 reps - 30 sec hold - Supine Quadriceps Stretch with Strap on Table  - 2 x daily - 7 x weekly - 3 reps - 30 sec hold - Doorway Pec Stretch at 60 Elevation  - 2 x daily - 7 x weekly - 3 reps - 30 sec hold - Doorway Pec Stretch at 90 Degrees Abduction  - 2 x daily - 7 x weekly - 3 reps - 30 sec hold - Seated Thoracic Lumbar  Extension  with Pectoralis Stretch  - 2 x daily - 7 x weekly - 2 sets - 10 reps - 5 sec hold - Standing Lumbar Extension at Stover  - 2 x daily - 7 x weekly - 2 sets - 10 reps - 5 sec hold - Standing Thoracic Extension at Wall  - 2 x daily - 7 x weekly - 2 sets - 10 reps - 5 sec hold - Standing Bilateral Low Shoulder Row with Anchored Resistance  - 1 x daily - 3 x weekly - 2 sets - 10 reps - 5 sec hold - Scapular Retraction with Resistance Advanced  - 1 x daily - 3 x weekly - 2 sets - 10 reps - 5 sec hold - Standing Lat Pull Down with Resistance - Elbows Bent  - 1 x daily - 3 x weekly - 2 sets - 10 reps - 3 sec hold - Standing Shoulder Horizontal Abduction with Resistance  - 1 x daily - 3 x weekly - 2 sets - 10 reps - 3 sec hold - Standing Shoulder Diagonal Horizontal Abduction 60/120 Degrees with Resistance  - 1 x daily - 3 x weekly - 2 sets - 10 reps - 3 sec hold - Shoulder External Rotation and Scapular Retraction with Resistance  - 1 x daily - 3 x weekly - 2 sets - 10 reps - 3-5 sec hold - Sidelying Thoracic Rotation with Open Book  - 1 x daily - 7 x weekly - 2 sets - 10 reps - 5 sec hold  ASSESSMENT:  CLINICAL IMPRESSION: Pt overall resppnded well. We continued to progress scapular stability to emphasize correct posture. He had some reports of discomfort in L shoulder with wall angels. Added sidelying open book to work on more thoracic mobility as this area was shown to be more tight. He would benefit from more focus on thoracic mobility and continued work on postural exercises to improve correct alignment.  OBJECTIVE IMPAIRMENTS: decreased activity tolerance, decreased mobility, decreased strength, hypomobility, increased fascial restrictions, impaired perceived functional ability, increased muscle spasms, impaired flexibility, improper body mechanics, postural dysfunction, and pain.   ACTIVITY LIMITATIONS: sitting and sleeping  PARTICIPATION LIMITATIONS:  gym workout  PERSONAL  FACTORS: Age, Fitness, Past/current experiences, Time since onset of injury/illness/exacerbation, and 3+ comorbidities: Central blindness; HTN; paroxysmal afib with RVR; chronic anticoagulation; HLD; R foot pain; R greater trochanteric bursitis; R shoulder RTC syndrome; Prediabetes; ETOH dependence  are also affecting patient's functional outcome.   REHAB POTENTIAL: Good  CLINICAL DECISION MAKING: Stable/uncomplicated  EVALUATION COMPLEXITY: Low   GOALS: Goals reviewed with patient? Yes  SHORT TERM GOALS: Target date: 03/13/2022   Patient will be independent with initial HEP to improve outcomes and carryover.  Baseline:  Goal status: IN PROGRESS  2.  Patient to verbalize understanding good spinal alignment/posturing and body mechanics needed for daily activities. Baseline:  Goal status: IN PROGRESS  LONG TERM GOALS: Target date: 04/03/2022    Patient will be independent with ongoing/advanced HEP for self-management at home.  Baseline:  Goal status: IN PROGRESS  2.  Patient will report 50-75% improvement in mid back pain to improve QOL.  Baseline:  Goal status: IN PROGRESS  3.  Patient to demonstrate ability to achieve and maintain good spinal alignment/posturing and body mechanics needed for daily activities. Baseline:  Goal status: IN PROGRESS  4.  Patient will demonstrate functional pain free thoracolumbar ROM to perform ADLs.   Baseline:  Goal status: IN PROGRESS  5.  Patient  will demonstrate improved B proximal LE and B shoulder ER strength to >/= 4+ to 5-/5 for improved stability and ease of mobility . Baseline:  Goal status: IN PROGRESS  6.  Patient will tolerate 30 min of sitting w/o increased mid back pain to improve comfort with daily positioning. Baseline:  Goal status: IN PROGRESS   PLAN: PT FREQUENCY: 2x/week  PT DURATION: 6 weeks  PLANNED INTERVENTIONS: Therapeutic exercises, Therapeutic activity, Neuromuscular re-education, Balance training, Gait  training, Patient/Family education, Self Care, Joint mobilization, Dry Needling, Electrical stimulation, Spinal mobilization, Cryotherapy, Moist heat, Taping, Traction, Ionotophoresis '4mg'$ /ml Dexamethasone, Manual therapy, and Re-evaluation.  PLAN FOR NEXT SESSION: progress postural and lumbopelvic flexibility and strengthening - adding rotational movement - open book and/or thread and reach; review and update HEP as indicated; MT and modalities as indicated   Artist Pais, PTA 03/04/2022, 2:01 PM

## 2022-03-06 ENCOUNTER — Ambulatory Visit: Payer: PPO | Admitting: Physical Therapy

## 2022-03-06 ENCOUNTER — Encounter: Payer: Self-pay | Admitting: Physical Therapy

## 2022-03-06 DIAGNOSIS — M546 Pain in thoracic spine: Secondary | ICD-10-CM

## 2022-03-06 DIAGNOSIS — M6281 Muscle weakness (generalized): Secondary | ICD-10-CM

## 2022-03-06 DIAGNOSIS — R293 Abnormal posture: Secondary | ICD-10-CM | POA: Diagnosis not present

## 2022-03-06 DIAGNOSIS — M62830 Muscle spasm of back: Secondary | ICD-10-CM

## 2022-03-06 NOTE — Therapy (Signed)
OUTPATIENT PHYSICAL THERAPY TREATMENT   Patient Name: [name] [name] MRN: [id number] DOB:08-03-1950, 71 y.o., male Today's Date: 03/06/2022   PT End of Session - 03/06/22 1618     Visit Number 5    Date for PT Re-Evaluation 04/03/22    Authorization Type [employer]    PT Start Time 1618    PT Stop Time 1702    PT Time Calculation (min) 44 min    Activity Tolerance Patient tolerated treatment well    Behavior During Therapy WFL for tasks assessed/performed                Past Medical History:  Diagnosis Date   Atrial fibrillation with RVR ([place])    Converted to NSR with lopressor   Benign prostatic hypertrophy with elevated PSA    Dr Alinda Money, High grade PIN w/o malignancy   History of colonic polyps 2003, 2007   hyperplastic   Hx of colonoscopy 2012   neg   Hyperlipidemia    Hypertension    Retinal vascular occlusion    Dr Delman Cheadle   Past Surgical History:  Procedure Laterality Date   APPENDECTOMY     COLONOSCOPY W/ POLYPECTOMY      X2 , Dr Deatra Ina   ENDOVENOUS ABLATION SAPHENOUS VEIN W/ LASER Left 09/29/2017   endovenous laser ablation left greater saphenous vein by Tinnie Gens MD    HERNIA REPAIR  [phone]    Dr Margot Chimes   lichenoid keratosis resected  8/14   LIPOMA EXCISION     X 5   PROSTATE BIOPSY  09/2010   Dr Alinda Money   stab phlebectomy  Left 01/27/2018   stab phlebectomy 10-20 incisions left leg by Ruta Hinds MD   Patient Active Problem List   Diagnosis Date Noted   Poor posture 02/06/2022   Mid back pain 02/06/2022   Right foot pain 02/06/2022   Secondary hypercoagulable state ([place]) 03/28/2021   Ingrown toenail of right foot 03/23/2019   Current use of long term anticoagulation 01/20/2018   EtOH dependence ([place]) 07/30/2017   Paroxysmal atrial fibrillation ([place]) 07/29/2017   Varicose veins of left lower extremity with complications 92/03/9416   Greater trochanteric bursitis of right hip 01/02/2017   Prediabetes 09/20/2016   Rotator cuff  syndrome of right shoulder 09/27/2015   Vitamin D deficiency 40/81/4481   Lichenoid keratosis 85/63/1497   Superficial thrombosis of leg 03/28/2013   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   Inguinal hernia recurrent unilateral 08/10/2009   History of colonic polyps 05/15/2008   Essential hypertension 05/15/2008   HYPERLIPIDEMIA 05/12/2007   Elevated prostate specific antigen (PSA) 05/12/2007    PCP: Binnie Rail, MD   REFERRING PROVIDER: Binnie Rail, MD   REFERRING DIAG:  M54.9 (ICD-10-CM) - Mid back pain  R29.3 (ICD-10-CM) - Poor posture    THERAPY DIAG:  Abnormal posture  Pain in thoracic spine  Muscle weakness (generalized)  Muscle spasm of back  RATIONALE FOR EVALUATION AND TREATMENT: Rehabilitation  ONSET DATE: 2-3 months for thoracic back pain  NEXT MD VISIT: 04/01/2022   SUBJECTIVE:  SUBJECTIVE STATEMENT: Pt noting some new pain in his R buttock today w/o known trigger.  PAIN:  Are you having pain? Yes: NPRS scale: 4/10 Pain location: R buttock Pain description: gnawing discomfort  PERTINENT HISTORY: Central blindness; HTN; paroxysmal afib with RVR; chronic anticoagulation; HLD; R foot pain; R greater trochanteric bursitis; R shoulder RTC syndrome; Prediabetes; ETOH dependence  PRECAUTIONS: None  WEIGHT BEARING RESTRICTIONS: No  FALLS:  Has patient fallen in last 6 months? Yes. Number of falls 1 - slip while playing golf  LIVING ENVIRONMENT: Lives with: lives with their spouse Lives in: House/apartment Stairs: Yes: Internal: 14 steps; on left going up and External: 3-4 steps; none Has following equipment at home: Single point cane, Walker - 2 wheeled, and shower chair  OCCUPATION: Retired  PLOF: Independent and Leisure: golf, yard work, gym 3x/wk  intermittently  PATIENT GOALS: "To avoid a stooped posture ("banana back")."   OBJECTIVE:   DIAGNOSTIC FINDINGS:  02/06/22 - Thoracic spine x-ray:  IMPRESSION: No fracture or dislocation of the thoracic spine. Normal thoracic kyphosis. Mild multilevel disc space height loss and osteophytosis with subtle bridging osteophytosis at multiple levels of the mid and lower thoracic spine suggesting DISH. 02/06/22 - Cervical spine x-ray: IMPRESSION: No fracture or static subluxation of the cervical spine. Moderate disc space height loss and osteophytosis from C5 through C7 with otherwise mild disc space height loss. Severe facet degenerative change at multiple levels, predominantly in the upper cervical spine. Associated bilateral bony neural foraminal stenosis as above. Cervical disc and neural foraminal pathology may be further evaluated by MRI if indicated by neurologically localizing signs and symptoms. 02/11/22 - R hip/pelvis x-ray: An AP pelvis and lateral right hip shows mild arthritic findings which is slight joint space narrowing and a small osteophyte. 02/11/22 - R knee x-ray: 2 views of the right knee show well-maintained medial lateral compartments.  There is narrowing of the patellofemoral joint.  There are changes in the metaphyseal area of the femur that are chronic and appear to be consistent with an enchondroma.  This is an incidental finding.  PATIENT SURVEYS:  Modified Oswestry 2 / 50 = 4.0%   SCREENING FOR RED FLAGS: Bowel or bladder incontinence: No Spinal tumors: No Cauda equina syndrome: No Compression fracture: No Abdominal aneurysm: No  COGNITION:  Overall cognitive status: Within functional limits for tasks assessed     SENSATION: WFL  MUSCLE LENGTH: Hamstrings: severe tight B ITB: mod tight B Piriformis: mild/mod tight B Hip flexors: mod/severe tight R>L Quads: mod tight R>L  POSTURE:  rounded shoulders, forward head, and increased thoracic  kyphosis  PALPATION: Denies TTP in thoracic paraspinals or periscapular muscles  THORACOLUMBAR ROM:   Active  A/PROM  eval  Flexion Hands to knee - tight HS  Extension 50% limited  Right lateral flexion Hand to lower thigh  Left lateral flexion Hand to lower thigh  Right rotation WFL  Left rotation WFL   (Blank rows = not tested)  UPPER EXTREMITY ROM: WFL but L shoulder slightly more limited than R  LOWER EXTREMITY ROM:    Limited due to musculature tightness  UPPER EXTREMITY MMT:  MMT Right eval Left eval  Shoulder flexion 5 5  Shoulder extension 5 5  Shoulder abduction 5 5  Shoulder adduction    Shoulder internal rotation 5 5  Shoulder external rotation 4 4  Middle trapezius    Lower trapezius    Elbow flexion    Elbow extension    Wrist flexion  Wrist extension    Wrist ulnar deviation    Wrist radial deviation    Wrist pronation    Wrist supination    Grip strength     (Blank rows = not tested)   LOWER EXTREMITY MMT:    MMT Right eval Left eval  Hip flexion 4+ 4+  Hip extension 4 4  Hip abduction 4 4  Hip adduction 4+ 4+  Hip internal rotation 4+ 4+  Hip external rotation 4+ 4+  Knee flexion 5 5  Knee extension 5 5  Ankle dorsiflexion 5 4  Ankle plantarflexion 5- 5-  Ankle inversion    Ankle eversion     (Blank rows = not tested)   TODAY'S TREATMENT:  03/06/22 THERAPEUTIC EXERCISE: to improve flexibility, strength and mobility.  Verbal and tactile cues throughout for technique. UBE - L3.0 x 6 min (3' each fwd & back) Sidelying B open books 10 x 5" Quadruped thread and reach x 10 bil Hooklying pec stretch over pool noodle x 60 sec Snow angels in hooklying on pool noodle x 10 - motion more limited in L shoulder  MANUAL THERAPY: To promote normalized muscle tension, improved flexibility, increased ROM, and reduced pain. STM/DTM and MFR/manual TPR to L pecs, lats and posterolateral deltoids  SELF CARE: Provided instruction in  self-STM techniques to pecs, deltoids and lats using tennis ball on wall. Reviewed pt's pictures of gym weight machines to help him identify which machines are appropriate for him to use and which to avoid for now.   03/04/22 Therapeutic Exercise: UBE L2 3 min fwd/ 3 min back BATCA low row 35# 2x10 Low and mid pec stretch in doorway x 30 sec BATCA lat pull 25# 2x10 Wall angels x 10 Wall push ups x 15 TRX rows x 10  Sidelying open books 10x 5 sec hold bil   02/27/22 THERAPEUTIC EXERCISE: to improve flexibility, strength and mobility.  Verbal and tactile cues throughout for technique. UBE - L2.5 x 6 min (3' each fwd & back) Low and mid doorway pec stretches x 30" each - cues to increase step length rather than lean fwd to increase stretch Prone over green Pball - I's, T's, Y' & W's x 10 each - PT stabilizing ball BATCA low row 35# 2 x 10 TRX low row x 10 Wall pushups 2 x 10   PATIENT EDUCATION:  Education details: self-STM techniques to pecs, lats and deltoids using tennis ball on wall   Person educated: Patient Education method: Explanation, Demonstration, and Handouts Education comprehension: verbalized understanding   HOME EXERCISE PROGRAM: Access Code: [email or web address] URL: [email or web address] Date: 03/06/2022 Prepared by: Annie Paras  Exercises - Hooklying Hamstring Stretch with Strap  - 2 x daily - 7 x weekly - 3 reps - 30 sec hold - Supine Quadriceps Stretch with Strap on Table  - 2 x daily - 7 x weekly - 3 reps - 30 sec hold - Doorway Pec Stretch at 60 Elevation  - 2 x daily - 7 x weekly - 3 reps - 30 sec hold - Doorway Pec Stretch at 90 Degrees Abduction  - 2 x daily - 7 x weekly - 3 reps - 30 sec hold - Seated Thoracic Lumbar Extension with Pectoralis Stretch  - 2 x daily - 7 x weekly - 2 sets - 10 reps - 5 sec hold - Standing Lumbar Extension at [place]  - 2 x daily - 7 x weekly - 2 sets - 10 reps - 5  sec hold - Standing Thoracic Extension at  Wall  - 2 x daily - 7 x weekly - 2 sets - 10 reps - 5 sec hold - Standing Bilateral Low Shoulder Row with Anchored Resistance  - 1 x daily - 3 x weekly - 2 sets - 10 reps - 5 sec hold - Scapular Retraction with Resistance Advanced  - 1 x daily - 3 x weekly - 2 sets - 10 reps - 5 sec hold - Standing Lat Pull Down with Resistance - Elbows Bent  - 1 x daily - 3 x weekly - 2 sets - 10 reps - 3 sec hold - Standing Shoulder Horizontal Abduction with Resistance  - 1 x daily - 3 x weekly - 2 sets - 10 reps - 3 sec hold - Standing Shoulder Diagonal Horizontal Abduction 60/120 Degrees with Resistance  - 1 x daily - 3 x weekly - 2 sets - 10 reps - 3 sec hold - Shoulder External Rotation and Scapular Retraction with Resistance  - 1 x daily - 3 x weekly - 2 sets - 10 reps - 3-5 sec hold - Sidelying Thoracic Rotation with Open Book  - 1 x daily - 7 x weekly - 2 sets - 10 reps - 5 sec hold - Standing Pectoral Release with Ball at Wall  - 1 x daily - 7 x weekly - 1-2 min hold - Standing Posterior Deltoid Release with Ball at [employer]  - 1 x daily - 7 x weekly - 1-2 min hold    ASSESSMENT:  CLINICAL IMPRESSION: Jyair requested verification that he is performing the open book stretches correctly - cues provided clarifying options for movement pattern (bow and arrow vs horiz abd into open book) as well as height of arm relative to shoulder to achieve most effective stretch w/o increased discomfort. Continued to progress thoracic rotation to include quadruped thread and reach. Pt noting benefit from passive stretch lying in supine where he has been able to gradually allow his neck to relax into a more retracted position - provided suggestion of adding a towel roll along his spine to increase anterior stretch while also guiding him through snow angel pattern to enhance pec stretch. Good tolerance other than L shoulder more limited into overhead motion than R with increased muscle tension evident in L pecs, lats and deltoids -  addressed with MT and instructions provided for self-STM to these muscle groups. Adrian is progressing well with therapy thus far and reports good comfort with HEP - STG #1 met.  OBJECTIVE IMPAIRMENTS: decreased activity tolerance, decreased mobility, decreased strength, hypomobility, increased fascial restrictions, impaired perceived functional ability, increased muscle spasms, impaired flexibility, improper body mechanics, postural dysfunction, and pain.   ACTIVITY LIMITATIONS: sitting and sleeping  PARTICIPATION LIMITATIONS:  gym workout  PERSONAL FACTORS: Age, Fitness, Past/current experiences, Time since onset of injury/illness/exacerbation, and 3+ comorbidities: Central blindness; HTN; paroxysmal afib with RVR; chronic anticoagulation; HLD; R foot pain; R greater trochanteric bursitis; R shoulder RTC syndrome; Prediabetes; ETOH dependence  are also affecting patient's functional outcome.   REHAB POTENTIAL: Good  CLINICAL DECISION MAKING: Stable/uncomplicated  EVALUATION COMPLEXITY: Low   GOALS: Goals reviewed with patient? Yes  SHORT TERM GOALS: Target date: 03/13/2022   Patient will be independent with initial HEP to improve outcomes and carryover.  Baseline:  Goal status: MET  03/06/22  2.  Patient to verbalize understanding good spinal alignment/posturing and body mechanics needed for daily activities. Baseline:  Goal status: IN PROGRESS  LONG  TERM GOALS: Target date: 04/03/2022    Patient will be independent with ongoing/advanced HEP for self-management at home.  Baseline:  Goal status: IN PROGRESS  2.  Patient will report 50-75% improvement in mid back pain to improve QOL.  Baseline:  Goal status: IN PROGRESS  3.  Patient to demonstrate ability to achieve and maintain good spinal alignment/posturing and body mechanics needed for daily activities. Baseline:  Goal status: IN PROGRESS  4.  Patient will demonstrate functional pain free thoracolumbar ROM to perform  ADLs.   Baseline:  Goal status: IN PROGRESS  5.  Patient will demonstrate improved B proximal LE and B shoulder ER strength to >/= 4+ to 5-/5 for improved stability and ease of mobility . Baseline:  Goal status: IN PROGRESS  6.  Patient will tolerate 30 min of sitting w/o increased mid back pain to improve comfort with daily positioning. Baseline:  Goal status: IN PROGRESS   PLAN: PT FREQUENCY: 2x/week  PT DURATION: 6 weeks  PLANNED INTERVENTIONS: Therapeutic exercises, Therapeutic activity, Neuromuscular re-education, Balance training, Gait training, Patient/Family education, Self Care, Joint mobilization, Dry Needling, Electrical stimulation, Spinal mobilization, Cryotherapy, Moist heat, Taping, Traction, Ionotophoresis 69m/ml Dexamethasone, Manual therapy, and Re-evaluation.  PLAN FOR NEXT SESSION: progress postural and lumbopelvic flexibility and strengthening - adding rotational movement - open book and/or thread and reach; review and update HEP as indicated; MT and modalities as indicated   JPercival Spanish PT 03/06/2022, 6:43 PM

## 2022-03-10 ENCOUNTER — Ambulatory Visit: Payer: PPO

## 2022-03-10 ENCOUNTER — Encounter: Payer: Self-pay | Admitting: Internal Medicine

## 2022-03-10 DIAGNOSIS — M62830 Muscle spasm of back: Secondary | ICD-10-CM

## 2022-03-10 DIAGNOSIS — E559 Vitamin D deficiency, unspecified: Secondary | ICD-10-CM

## 2022-03-10 DIAGNOSIS — R293 Abnormal posture: Secondary | ICD-10-CM | POA: Diagnosis not present

## 2022-03-10 DIAGNOSIS — E782 Mixed hyperlipidemia: Secondary | ICD-10-CM

## 2022-03-10 DIAGNOSIS — R7303 Prediabetes: Secondary | ICD-10-CM

## 2022-03-10 DIAGNOSIS — M6281 Muscle weakness (generalized): Secondary | ICD-10-CM

## 2022-03-10 DIAGNOSIS — I480 Paroxysmal atrial fibrillation: Secondary | ICD-10-CM

## 2022-03-10 DIAGNOSIS — I10 Essential (primary) hypertension: Secondary | ICD-10-CM

## 2022-03-10 DIAGNOSIS — M546 Pain in thoracic spine: Secondary | ICD-10-CM

## 2022-03-10 NOTE — Therapy (Signed)
OUTPATIENT PHYSICAL THERAPY TREATMENT   Patient Name: [name] [name] MRN: [id number] DOB:1951/03/21, 71 y.o., male Today's Date: 03/10/2022   PT End of Session - 03/10/22 1014     Visit Number 6    Date for PT Re-Evaluation 04/03/22    Authorization Type [employer]    PT Start Time 1011    PT Stop Time 1057    PT Time Calculation (min) 46 min    Activity Tolerance Patient tolerated treatment well    Behavior During Therapy WFL for tasks assessed/performed                 Past Medical History:  Diagnosis Date   Atrial fibrillation with RVR ([place])    Converted to NSR with lopressor   Benign prostatic hypertrophy with elevated PSA    Dr Alinda Money, High grade PIN w/o malignancy   History of colonic polyps 2003, 2007   hyperplastic   Hx of colonoscopy 2012   neg   Hyperlipidemia    Hypertension    Retinal vascular occlusion    Dr Delman Cheadle   Past Surgical History:  Procedure Laterality Date   APPENDECTOMY     COLONOSCOPY W/ POLYPECTOMY      X2 , Dr Deatra Ina   ENDOVENOUS ABLATION SAPHENOUS VEIN W/ LASER Left 09/29/2017   endovenous laser ablation left greater saphenous vein by Tinnie Gens MD    HERNIA REPAIR  [id number]    Dr Margot Chimes   lichenoid keratosis resected  8/14   LIPOMA EXCISION     X 5   PROSTATE BIOPSY  09/2010   Dr Alinda Money   stab phlebectomy  Left 01/27/2018   stab phlebectomy 10-20 incisions left leg by Ruta Hinds MD   Patient Active Problem List   Diagnosis Date Noted   Poor posture 02/06/2022   Mid back pain 02/06/2022   Right foot pain 02/06/2022   Secondary hypercoagulable state ([place]) 03/28/2021   Ingrown toenail of right foot 03/23/2019   Current use of long term anticoagulation 01/20/2018   EtOH dependence ([place]) 07/30/2017   Paroxysmal atrial fibrillation ([place]) 07/29/2017   Varicose veins of left lower extremity with complications 87/68/1157   Greater trochanteric bursitis of right hip 01/02/2017   Prediabetes 09/20/2016   Rotator cuff  syndrome of right shoulder 09/27/2015   Vitamin D deficiency 26/20/3559   Lichenoid keratosis 74/16/3845   Superficial thrombosis of leg 03/28/2013   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   Inguinal hernia recurrent unilateral 08/10/2009   History of colonic polyps 05/15/2008   Essential hypertension 05/15/2008   HYPERLIPIDEMIA 05/12/2007   Elevated prostate specific antigen (PSA) 05/12/2007    PCP: Binnie Rail, MD   REFERRING PROVIDER: Binnie Rail, MD   REFERRING DIAG:  M54.9 (ICD-10-CM) - Mid back pain  R29.3 (ICD-10-CM) - Poor posture    THERAPY DIAG:  Abnormal posture  Pain in thoracic spine  Muscle weakness (generalized)  Muscle spasm of back  RATIONALE FOR EVALUATION AND TREATMENT: Rehabilitation  ONSET DATE: 2-3 months for thoracic back pain  NEXT MD VISIT: 04/01/2022   SUBJECTIVE:  SUBJECTIVE STATEMENT: Pt reports his hip is just bothering him today.  PAIN:  Are you having pain? Yes: NPRS scale: 4/10 Pain location: R hip Pain description: gnawing discomfort  PERTINENT HISTORY: Central blindness; HTN; paroxysmal afib with RVR; chronic anticoagulation; HLD; R foot pain; R greater trochanteric bursitis; R shoulder RTC syndrome; Prediabetes; ETOH dependence  PRECAUTIONS: None  WEIGHT BEARING RESTRICTIONS: No  FALLS:  Has patient fallen in last 6 months? Yes. Number of falls 1 - slip while playing golf  LIVING ENVIRONMENT: Lives with: lives with their spouse Lives in: House/apartment Stairs: Yes: Internal: 14 steps; on left going up and External: 3-4 steps; none Has following equipment at home: Single point cane, Walker - 2 wheeled, and shower chair  OCCUPATION: Retired  PLOF: Independent and Leisure: golf, yard work, gym 3x/wk intermittently  PATIENT GOALS: "To avoid  a stooped posture ("banana back")."   OBJECTIVE:   DIAGNOSTIC FINDINGS:  02/06/22 - Thoracic spine x-ray:  IMPRESSION: No fracture or dislocation of the thoracic spine. Normal thoracic kyphosis. Mild multilevel disc space height loss and osteophytosis with subtle bridging osteophytosis at multiple levels of the mid and lower thoracic spine suggesting DISH. 02/06/22 - Cervical spine x-ray: IMPRESSION: No fracture or static subluxation of the cervical spine. Moderate disc space height loss and osteophytosis from C5 through C7 with otherwise mild disc space height loss. Severe facet degenerative change at multiple levels, predominantly in the upper cervical spine. Associated bilateral bony neural foraminal stenosis as above. Cervical disc and neural foraminal pathology may be further evaluated by MRI if indicated by neurologically localizing signs and symptoms. 02/11/22 - R hip/pelvis x-ray: An AP pelvis and lateral right hip shows mild arthritic findings which is slight joint space narrowing and a small osteophyte. 02/11/22 - R knee x-ray: 2 views of the right knee show well-maintained medial lateral compartments.  There is narrowing of the patellofemoral joint.  There are changes in the metaphyseal area of the femur that are chronic and appear to be consistent with an enchondroma.  This is an incidental finding.  PATIENT SURVEYS:  Modified Oswestry 2 / 50 = 4.0%   SCREENING FOR RED FLAGS: Bowel or bladder incontinence: No Spinal tumors: No Cauda equina syndrome: No Compression fracture: No Abdominal aneurysm: No  COGNITION:  Overall cognitive status: Within functional limits for tasks assessed     SENSATION: WFL  MUSCLE LENGTH: Hamstrings: severe tight B ITB: mod tight B Piriformis: mild/mod tight B Hip flexors: mod/severe tight R>L Quads: mod tight R>L  POSTURE:  rounded shoulders, forward head, and increased thoracic kyphosis  PALPATION: Denies TTP in thoracic paraspinals  or periscapular muscles  THORACOLUMBAR ROM:   Active  A/PROM  eval  Flexion Hands to knee - tight HS  Extension 50% limited  Right lateral flexion Hand to lower thigh  Left lateral flexion Hand to lower thigh  Right rotation WFL  Left rotation WFL   (Blank rows = not tested)  UPPER EXTREMITY ROM: WFL but L shoulder slightly more limited than R  LOWER EXTREMITY ROM:    Limited due to musculature tightness  UPPER EXTREMITY MMT:  MMT Right eval Left eval  Shoulder flexion 5 5  Shoulder extension 5 5  Shoulder abduction 5 5  Shoulder adduction    Shoulder internal rotation 5 5  Shoulder external rotation 4 4  Middle trapezius    Lower trapezius    Elbow flexion    Elbow extension    Wrist flexion    Wrist extension  Wrist ulnar deviation    Wrist radial deviation    Wrist pronation    Wrist supination    Grip strength     (Blank rows = not tested)   LOWER EXTREMITY MMT:    MMT Right eval Left eval  Hip flexion 4+ 4+  Hip extension 4 4  Hip abduction 4 4  Hip adduction 4+ 4+  Hip internal rotation 4+ 4+  Hip external rotation 4+ 4+  Knee flexion 5 5  Knee extension 5 5  Ankle dorsiflexion 5 4  Ankle plantarflexion 5- 5-  Ankle inversion    Ankle eversion     (Blank rows = not tested)   TODAY'S TREATMENT: 03/10/22 THERAPEUTIC EXERCISE: to improve flexibility, strength and mobility.  Verbal and tactile cues throughout for technique. UBE - L3.0 x 6 min (3' each fwd & back) Wall angels x 10 L pec stretch hand behind head supine 3x15"  Supine shoulder flexion with 1# weight x 10 Supine shoulder scpation with 1# weight x 10 Ys over foam roll x 10  Manual Therapy: STM to L pec minor, lats with pin and stretch  03/06/22 THERAPEUTIC EXERCISE: to improve flexibility, strength and mobility.  Verbal and tactile cues throughout for technique. UBE - L3.0 x 6 min (3' each fwd & back) Sidelying B open books 10 x 5" Quadruped thread and reach x 10  bil Hooklying pec stretch over pool noodle x 60 sec Snow angels in hooklying on pool noodle x 10 - motion more limited in L shoulder  MANUAL THERAPY: To promote normalized muscle tension, improved flexibility, increased ROM, and reduced pain. STM/DTM and MFR/manual TPR to L pecs, lats and posterolateral deltoids  SELF CARE: Provided instruction in self-STM techniques to pecs, deltoids and lats using tennis ball on wall. Reviewed pt's pictures of gym weight machines to help him identify which machines are appropriate for him to use and which to avoid for now.   03/04/22 Therapeutic Exercise: UBE L2 3 min fwd/ 3 min back BATCA low row 35# 2x10 Low and mid pec stretch in doorway x 30 sec BATCA lat pull 25# 2x10 Wall angels x 10 Wall push ups x 15 TRX rows x 10  Sidelying open books 10x 5 sec hold bil    PATIENT EDUCATION:  Education details: self-STM techniques to pecs, lats and deltoids using tennis ball on wall   Person educated: Patient Education method: Explanation, Demonstration, and Handouts Education comprehension: verbalized understanding   HOME EXERCISE PROGRAM: Access Code: [email or web address] URL: [email or web address] Date: 03/06/2022 Prepared by: Annie Paras  Exercises - Hooklying Hamstring Stretch with Strap  - 2 x daily - 7 x weekly - 3 reps - 30 sec hold - Supine Quadriceps Stretch with Strap on Table  - 2 x daily - 7 x weekly - 3 reps - 30 sec hold - Doorway Pec Stretch at 60 Elevation  - 2 x daily - 7 x weekly - 3 reps - 30 sec hold - Doorway Pec Stretch at 90 Degrees Abduction  - 2 x daily - 7 x weekly - 3 reps - 30 sec hold - Seated Thoracic Lumbar Extension with Pectoralis Stretch  - 2 x daily - 7 x weekly - 2 sets - 10 reps - 5 sec hold - Standing Lumbar Extension at Rea  - 2 x daily - 7 x weekly - 2 sets - 10 reps - 5 sec hold - Standing Thoracic Extension at Wall  - 2 x daily -  7 x weekly - 2 sets - 10 reps - 5 sec hold - Standing  Bilateral Low Shoulder Row with Anchored Resistance  - 1 x daily - 3 x weekly - 2 sets - 10 reps - 5 sec hold - Scapular Retraction with Resistance Advanced  - 1 x daily - 3 x weekly - 2 sets - 10 reps - 5 sec hold - Standing Lat Pull Down with Resistance - Elbows Bent  - 1 x daily - 3 x weekly - 2 sets - 10 reps - 3 sec hold - Standing Shoulder Horizontal Abduction with Resistance  - 1 x daily - 3 x weekly - 2 sets - 10 reps - 3 sec hold - Standing Shoulder Diagonal Horizontal Abduction 60/120 Degrees with Resistance  - 1 x daily - 3 x weekly - 2 sets - 10 reps - 3 sec hold - Shoulder External Rotation and Scapular Retraction with Resistance  - 1 x daily - 3 x weekly - 2 sets - 10 reps - 3-5 sec hold - Sidelying Thoracic Rotation with Open Book  - 1 x daily - 7 x weekly - 2 sets - 10 reps - 5 sec hold - Standing Pectoral Release with Ball at Wall  - 1 x daily - 7 x weekly - 1-2 min hold - Standing Posterior Deltoid Release with Ball at Wall  - 1 x daily - 7 x weekly - 1-2 min hold    ASSESSMENT:  CLINICAL IMPRESSION: Pt continued to respond well to treatment. We progressed working on thoracic mobility and more deltoid strengthening today. Pt presented with tightness and tenderness in the lats and pec minor area today. We reviewed over posture and body mechanics today, with a handout provided, pt was w/o any concerns and able to verbalize good posture and body mechanics, meeting STG #2. With the tension noted in pecs, would continue working on long duration/gravity assisted pectoral stretching to improve postural alignment.  OBJECTIVE IMPAIRMENTS: decreased activity tolerance, decreased mobility, decreased strength, hypomobility, increased fascial restrictions, impaired perceived functional ability, increased muscle spasms, impaired flexibility, improper body mechanics, postural dysfunction, and pain.   ACTIVITY LIMITATIONS: sitting and sleeping  PARTICIPATION LIMITATIONS:  gym  workout  PERSONAL FACTORS: Age, Fitness, Past/current experiences, Time since onset of injury/illness/exacerbation, and 3+ comorbidities: Central blindness; HTN; paroxysmal afib with RVR; chronic anticoagulation; HLD; R foot pain; R greater trochanteric bursitis; R shoulder RTC syndrome; Prediabetes; ETOH dependence  are also affecting patient's functional outcome.   REHAB POTENTIAL: Good  CLINICAL DECISION MAKING: Stable/uncomplicated  EVALUATION COMPLEXITY: Low   GOALS: Goals reviewed with patient? Yes  SHORT TERM GOALS: Target date: 03/13/2022   Patient will be independent with initial HEP to improve outcomes and carryover.  Baseline:  Goal status: MET  03/06/22  2.  Patient to verbalize understanding good spinal alignment/posturing and body mechanics needed for daily activities. Baseline:  Goal status: MET - 03/10/22  LONG TERM GOALS: Target date: 04/03/2022    Patient will be independent with ongoing/advanced HEP for self-management at home.  Baseline:  Goal status: IN PROGRESS  2.  Patient will report 50-75% improvement in mid back pain to improve QOL.  Baseline:  Goal status: IN PROGRESS  3.  Patient to demonstrate ability to achieve and maintain good spinal alignment/posturing and body mechanics needed for daily activities. Baseline:  Goal status: IN PROGRESS  4.  Patient will demonstrate functional pain free thoracolumbar ROM to perform ADLs.   Baseline:  Goal status: IN PROGRESS  5.  Patient will demonstrate improved B proximal LE and B shoulder ER strength to >/= 4+ to 5-/5 for improved stability and ease of mobility . Baseline:  Goal status: IN PROGRESS  6.  Patient will tolerate 30 min of sitting w/o increased mid back pain to improve comfort with daily positioning. Baseline:  Goal status: IN PROGRESS   PLAN: PT FREQUENCY: 2x/week  PT DURATION: 6 weeks  PLANNED INTERVENTIONS: Therapeutic exercises, Therapeutic activity, Neuromuscular re-education,  Balance training, Gait training, Patient/Family education, Self Care, Joint mobilization, Dry Needling, Electrical stimulation, Spinal mobilization, Cryotherapy, Moist heat, Taping, Traction, Ionotophoresis 20m/ml Dexamethasone, Manual therapy, and Re-evaluation.  PLAN FOR NEXT SESSION: progress postural and lumbopelvic flexibility and strengthening - work on gravity assisted pec stretching; review and update HEP as indicated; MT and modalities as indicated   Bonita Brindisi L CCarlis Abbott PTA 03/10/2022, 11:00 AM

## 2022-03-13 ENCOUNTER — Encounter: Payer: PPO | Admitting: Physical Therapy

## 2022-03-20 ENCOUNTER — Encounter: Payer: Self-pay | Admitting: Physical Therapy

## 2022-03-20 ENCOUNTER — Ambulatory Visit: Payer: PPO | Attending: Internal Medicine | Admitting: Physical Therapy

## 2022-03-20 DIAGNOSIS — M546 Pain in thoracic spine: Secondary | ICD-10-CM | POA: Diagnosis not present

## 2022-03-20 DIAGNOSIS — M6281 Muscle weakness (generalized): Secondary | ICD-10-CM | POA: Insufficient documentation

## 2022-03-20 DIAGNOSIS — R293 Abnormal posture: Secondary | ICD-10-CM | POA: Insufficient documentation

## 2022-03-20 DIAGNOSIS — M62830 Muscle spasm of back: Secondary | ICD-10-CM | POA: Insufficient documentation

## 2022-03-20 NOTE — Therapy (Signed)
OUTPATIENT PHYSICAL THERAPY TREATMENT   Patient Name: [name] [name] MRN: [id number] DOB:[birth year], 71 y.o., male Today's Date: 03/20/2022   PT End of Session - 03/20/22 1616     Visit Number 7    Date for PT Re-Evaluation 04/03/22    Authorization Type [employer]    PT Start Time 1616    PT Stop Time 1656    PT Time Calculation (min) 40 min    Activity Tolerance Patient tolerated treatment well    Behavior During Therapy WFL for tasks assessed/performed                  Past Medical History:  Diagnosis Date   Atrial fibrillation with RVR ([place])    Converted to NSR with lopressor   Benign prostatic hypertrophy with elevated PSA    Dr Alinda Money, High grade PIN w/o malignancy   History of colonic polyps 2003, 2007   hyperplastic   Hx of colonoscopy 2012   neg   Hyperlipidemia    Hypertension    Retinal vascular occlusion    Dr Delman Cheadle   Past Surgical History:  Procedure Laterality Date   APPENDECTOMY     COLONOSCOPY W/ POLYPECTOMY      X2 , Dr Deatra Ina   ENDOVENOUS ABLATION SAPHENOUS VEIN W/ LASER Left 09/29/2017   endovenous laser ablation left greater saphenous vein by Tinnie Gens MD    HERNIA REPAIR  [phone]    Dr Margot Chimes   lichenoid keratosis resected  8/14   LIPOMA EXCISION     X 5   PROSTATE BIOPSY  09/2010   Dr Alinda Money   stab phlebectomy  Left 01/27/2018   stab phlebectomy 10-20 incisions left leg by Ruta Hinds MD   Patient Active Problem List   Diagnosis Date Noted   Poor posture 02/06/2022   Mid back pain 02/06/2022   Right foot pain 02/06/2022   Secondary hypercoagulable state ([place]) 03/28/2021   Ingrown toenail of right foot 03/23/2019   Current use of long term anticoagulation 01/20/2018   EtOH dependence ([place]) 07/30/2017   Paroxysmal atrial fibrillation ([place]) 07/29/2017   Varicose veins of left lower extremity with complications 36/43/8377   Greater trochanteric bursitis of right hip 01/02/2017   Prediabetes 09/20/2016   Rotator cuff  syndrome of right shoulder 09/27/2015   Vitamin D deficiency 93/96/8864   Lichenoid keratosis 84/72/0721   Superficial thrombosis of leg 03/28/2013   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   Inguinal hernia recurrent unilateral 08/10/2009   History of colonic polyps 05/15/2008   Essential hypertension 05/15/2008   HYPERLIPIDEMIA 05/12/2007   Elevated prostate specific antigen (PSA) 05/12/2007    PCP: Binnie Rail, MD   REFERRING PROVIDER: Binnie Rail, MD   REFERRING DIAG:  M54.9 (ICD-10-CM) - Mid back pain  R29.3 (ICD-10-CM) - Poor posture    THERAPY DIAG:  Abnormal posture  Pain in thoracic spine  Muscle weakness (generalized)  Muscle spasm of back  RATIONALE FOR EVALUATION AND TREATMENT: Rehabilitation  ONSET DATE: 2-3 months for thoracic back pain  NEXT MD VISIT: 04/01/2022   SUBJECTIVE:  SUBJECTIVE STATEMENT: Pt reports his R hip continues to bother him although he does not think it is related to anything we have done in PT. He noted improving flexibility and was able to sit in an [place] chair while at the beach last week w/o pain in his back.  PAIN:  Are you having pain? Yes: NPRS scale: intermittently 5-6/10 Pain location: R lateral hip Pain description: gnawing discomfort  PERTINENT HISTORY: Central blindness; HTN; paroxysmal afib with RVR; chronic anticoagulation; HLD; R foot pain; R greater trochanteric bursitis; R shoulder RTC syndrome; Prediabetes; ETOH dependence  PRECAUTIONS: None  WEIGHT BEARING RESTRICTIONS: No  FALLS:  Has patient fallen in last 6 months? Yes. Number of falls 1 - slip while playing golf  LIVING ENVIRONMENT: Lives with: lives with their spouse Lives in: House/apartment Stairs: Yes: Internal: 14 steps; on left going up and External: 3-4 steps;  none Has following equipment at home: Single point cane, Walker - 2 wheeled, and shower chair  OCCUPATION: Retired  PLOF: Independent and Leisure: golf, yard work, gym 3x/wk intermittently  PATIENT GOALS: "To avoid a stooped posture ("banana back")."   OBJECTIVE:   DIAGNOSTIC FINDINGS:  02/06/22 - Thoracic spine x-ray:  IMPRESSION: No fracture or dislocation of the thoracic spine. Normal thoracic kyphosis. Mild multilevel disc space height loss and osteophytosis with subtle bridging osteophytosis at multiple levels of the mid and lower thoracic spine suggesting DISH. 02/06/22 - Cervical spine x-ray: IMPRESSION: No fracture or static subluxation of the cervical spine. Moderate disc space height loss and osteophytosis from C5 through C7 with otherwise mild disc space height loss. Severe facet degenerative change at multiple levels, predominantly in the upper cervical spine. Associated bilateral bony neural foraminal stenosis as above. Cervical disc and neural foraminal pathology may be further evaluated by MRI if indicated by neurologically localizing signs and symptoms. 02/11/22 - R hip/pelvis x-ray: An AP pelvis and lateral right hip shows mild arthritic findings which is slight joint space narrowing and a small osteophyte. 02/11/22 - R knee x-ray: 2 views of the right knee show well-maintained medial lateral compartments.  There is narrowing of the patellofemoral joint.  There are changes in the metaphyseal area of the femur that are chronic and appear to be consistent with an enchondroma.  This is an incidental finding.  PATIENT SURVEYS:  Modified Oswestry 2 / 50 = 4.0%   SCREENING FOR RED FLAGS: Bowel or bladder incontinence: No Spinal tumors: No Cauda equina syndrome: No Compression fracture: No Abdominal aneurysm: No  COGNITION:  Overall cognitive status: Within functional limits for tasks assessed     SENSATION: WFL  MUSCLE LENGTH: Hamstrings: severe tight B ITB: mod  tight B Piriformis: mild/mod tight B Hip flexors: mod/severe tight R>L Quads: mod tight R>L  POSTURE:  rounded shoulders, forward head, and increased thoracic kyphosis  PALPATION: Denies TTP in thoracic paraspinals or periscapular muscles  THORACOLUMBAR ROM:   Active  A/PROM  eval  Flexion Hands to knee - tight HS  Extension 50% limited  Right lateral flexion Hand to lower thigh  Left lateral flexion Hand to lower thigh  Right rotation WFL  Left rotation WFL   (Blank rows = not tested)  UPPER EXTREMITY ROM: WFL but L shoulder slightly more limited than R  LOWER EXTREMITY ROM:    Limited due to musculature tightness  UPPER EXTREMITY MMT:  MMT Right eval Left eval  Shoulder flexion 5 5  Shoulder extension 5 5  Shoulder abduction 5 5  Shoulder adduction  Shoulder internal rotation 5 5  Shoulder external rotation 4 4  Middle trapezius    Lower trapezius    Elbow flexion    Elbow extension    Wrist flexion    Wrist extension    Wrist ulnar deviation    Wrist radial deviation    Wrist pronation    Wrist supination    Grip strength     (Blank rows = not tested)   LOWER EXTREMITY MMT:    MMT Right eval Left eval  Hip flexion 4+ 4+  Hip extension 4 4  Hip abduction 4 4  Hip adduction 4+ 4+  Hip internal rotation 4+ 4+  Hip external rotation 4+ 4+  Knee flexion 5 5  Knee extension 5 5  Ankle dorsiflexion 5 4  Ankle plantarflexion 5- 5-  Ankle inversion    Ankle eversion     (Blank rows = not tested)   TODAY'S TREATMENT:  03/20/22 THERAPEUTIC EXERCISE: to improve flexibility, strength and mobility.  Verbal and tactile cues throughout for technique. UBE - L3.0 x 6 min (3' each fwd & back) in standing - focusing on postural awareness Hooklying vertically on foam roll (pillow under head to support neck):    Pec stretches in T, Y, and W positions x 15-30"    Scap retraction + GTB horiz ABD x 10    Scap retraction + GTB horiz ABD diagonals x 10     Scap retraction + GTB shoulder ER x 10    Chin tuck into pillow on FR 10 x 3" Thoracic extension mobilization over 6" FR, adjusting FR to levels of greatest restriction Thoracic mobilization/rolling of posterior thoracic muscles demonstrated but not attempted Seated chin tuck with 2 finger guidance 10 x 3"  MANUAL THERAPY: To promote improved flexibility and increased ROM. Manual cervical distraction with pt performing resisted chin tuck   03/10/22 THERAPEUTIC EXERCISE: to improve flexibility, strength and mobility.  Verbal and tactile cues throughout for technique. UBE - L3.0 x 6 min (3' each fwd & back) Wall angels x 10 L pec stretch hand behind head supine 3x15"  Supine shoulder flexion with 1# weight x 10 Supine shoulder scpation with 1# weight x 10 Ys over foam roll x 10  Manual Therapy: STM to L pec minor, lats with pin and stretch   03/06/22 THERAPEUTIC EXERCISE: to improve flexibility, strength and mobility.  Verbal and tactile cues throughout for technique. UBE - L3.0 x 6 min (3' each fwd & back) Sidelying B open books 10 x 5" Quadruped thread and reach x 10 bil Hooklying pec stretch over pool noodle x 60 sec Snow angels in hooklying on pool noodle x 10 - motion more limited in L shoulder  MANUAL THERAPY: To promote normalized muscle tension, improved flexibility, increased ROM, and reduced pain. STM/DTM and MFR/manual TPR to L pecs, lats and posterolateral deltoids  SELF CARE: Provided instruction in self-STM techniques to pecs, deltoids and lats using tennis ball on wall. Reviewed pt's pictures of gym weight machines to help him identify which machines are appropriate for him to use and which to avoid for now.   03/04/22 Therapeutic Exercise: UBE L2 3 min fwd/ 3 min back BATCA low row 35# 2x10 Low and mid pec stretch in doorway x 30 sec BATCA lat pull 25# 2x10 Wall angels x 10 Wall push ups x 15 TRX rows x 10  Sidelying open books 10x 5 sec hold  bil    PATIENT EDUCATION:  Education details:  HEP update - chin tucks and foam roller exercises and self-STM techniques to posterior thoracic muscles using foam roller   Person educated: Patient Education method: Explanation, Demonstration, and Handouts Education comprehension: verbalized understanding   HOME EXERCISE PROGRAM: Access Code: [email or web address] URL: [email or web address] Date: 03/20/2022 Prepared by: Annie Paras  Exercises - Hooklying Hamstring Stretch with Strap  - 2 x daily - 7 x weekly - 3 reps - 30 sec hold - Supine Quadriceps Stretch with Strap on Table  - 2 x daily - 7 x weekly - 3 reps - 30 sec hold - Doorway Pec Stretch at 60 Elevation  - 2 x daily - 7 x weekly - 3 reps - 30 sec hold - Doorway Pec Stretch at 90 Degrees Abduction  - 2 x daily - 7 x weekly - 3 reps - 30 sec hold - Seated Thoracic Lumbar Extension with Pectoralis Stretch  - 2 x daily - 7 x weekly - 2 sets - 10 reps - 5 sec hold - Standing Lumbar Extension at Wall - Forearms  - 2 x daily - 7 x weekly - 2 sets - 10 reps - 5 sec hold - Standing Thoracic Extension at Wall  - 2 x daily - 7 x weekly - 2 sets - 10 reps - 5 sec hold - Standing Bilateral Low Shoulder Row with Anchored Resistance  - 1 x daily - 3 x weekly - 2 sets - 10 reps - 5 sec hold - Scapular Retraction with Resistance Advanced  - 1 x daily - 3 x weekly - 2 sets - 10 reps - 5 sec hold - Standing Lat Pull Down with Resistance - Elbows Bent  - 1 x daily - 3 x weekly - 2 sets - 10 reps - 3 sec hold - Standing Shoulder Horizontal Abduction with Resistance  - 1 x daily - 3 x weekly - 2 sets - 10 reps - 3 sec hold - Standing Shoulder Diagonal Horizontal Abduction 60/120 Degrees with Resistance  - 1 x daily - 3 x weekly - 2 sets - 10 reps - 3 sec hold - Shoulder External Rotation and Scapular Retraction with Resistance  - 1 x daily - 3 x weekly - 2 sets - 10 reps - 3-5 sec hold - Sidelying Thoracic Rotation with Open Book  - 1 x daily - 7 x  weekly - 2 sets - 10 reps - 5 sec hold - Standing Pectoral Release with Ball at Wall  - 1 x daily - 7 x weekly - 1-2 min hold - Standing Posterior Deltoid Release with Ball at [employer]  - 1 x daily - 7 x weekly - 1-2 min hold - Seated Passive Cervical Retraction  - 2 x daily - 7 x weekly - 2 sets - 10 reps - 3-5 sec hold - Thoracic Mobilization with Hands Behind Head on Foam Roll  - 1 x daily - 7 x weekly - 2 sets - 10 reps - 3 sec hold - Thoracic Mobilization on Foam Roll  - 1 x daily - 7 x weekly - 2 sets - 10 reps - 3 sec hold - Supine Chest Stretch on Foam Roll  - 1 x daily - 7 x weekly - 2 sets - 10 reps - 3 sec hold    ASSESSMENT:  CLINICAL IMPRESSION: Nasier is noting awareness of improving posture and was able to sit [place] while on vacation w/o pain in mid back. He states the mid back hasn't really been an  issue of late but he has had some intermittent R hip pain - he plans to address this at his upcoming MD visit on 04/01/22 and may request PT orders if MD feels that this is appropriate. We continued to progress postural stretching and strengthening to further normalize postural alignment, utilizing foam roller for long duration/gravity assisted pectoral stretching as well as scapular/postural strengthening - pt indicating he might want to obtain a foam roller, therefore home instructions provided. He has difficulty grasping the desired movement pattern with cervical retraction both when attempted in supine and sitting - he will benefit from further training for this along with eventual progression to resisted strengthening. He is progressing well toward his goal with LTGs #2 and 6 met today.  OBJECTIVE IMPAIRMENTS: decreased activity tolerance, decreased mobility, decreased strength, hypomobility, increased fascial restrictions, impaired perceived functional ability, increased muscle spasms, impaired flexibility, improper body mechanics, postural dysfunction, and pain.   ACTIVITY  LIMITATIONS: sitting and sleeping  PARTICIPATION LIMITATIONS:  gym workout  PERSONAL FACTORS: Age, Fitness, Past/current experiences, Time since onset of injury/illness/exacerbation, and 3+ comorbidities: Central blindness; HTN; paroxysmal afib with RVR; chronic anticoagulation; HLD; R foot pain; R greater trochanteric bursitis; R shoulder RTC syndrome; Prediabetes; ETOH dependence  are also affecting patient's functional outcome.   REHAB POTENTIAL: Good  CLINICAL DECISION MAKING: Stable/uncomplicated  EVALUATION COMPLEXITY: Low   GOALS: Goals reviewed with patient? Yes  SHORT TERM GOALS: Target date: 03/13/2022   Patient will be independent with initial HEP to improve outcomes and carryover.  Baseline:  Goal status: MET - 03/06/22  2.  Patient to verbalize understanding good spinal alignment/posturing and body mechanics needed for daily activities. Baseline:  Goal status: MET - 03/10/22  LONG TERM GOALS: Target date: 04/03/2022    Patient will be independent with ongoing/advanced HEP for self-management at home.  Baseline:  Goal status: IN PROGRESS  2.  Patient will report 50-75% improvement in mid back pain to improve QOL.  Baseline:  Goal status: MET - 03/20/22  3.  Patient to demonstrate ability to achieve and maintain good spinal alignment/posturing and body mechanics needed for daily activities. Baseline:  Goal status: IN PROGRESS  4.  Patient will demonstrate functional pain free thoracolumbar ROM to perform ADLs.   Baseline:  Goal status: IN PROGRESS  5.  Patient will demonstrate improved B proximal LE and B shoulder ER strength to >/= 4+ to 5-/5 for improved stability and ease of mobility . Baseline:  Goal status: IN PROGRESS  6.  Patient will tolerate 30 min of sitting w/o increased mid back pain to improve comfort with daily positioning. Baseline:  Goal status: MET - 03/20/22   PLAN: PT FREQUENCY: 2x/week  PT DURATION: 6 weeks  PLANNED INTERVENTIONS:  Therapeutic exercises, Therapeutic activity, Neuromuscular re-education, Balance training, Gait training, Patient/Family education, Self Care, Joint mobilization, Dry Needling, Electrical stimulation, Spinal mobilization, Cryotherapy, Moist heat, Taping, Traction, Ionotophoresis 54m/ml Dexamethasone, Manual therapy, and Re-evaluation.  PLAN FOR NEXT SESSION: progress postural and lumbopelvic flexibility and strengthening - work on gravity assisted pec stretching; review and update HEP as indicated; MT and modalities as indicated   JPercival Spanish PT 03/20/2022, 6:01 PM

## 2022-03-25 ENCOUNTER — Encounter: Payer: Self-pay | Admitting: Family Medicine

## 2022-03-25 ENCOUNTER — Ambulatory Visit (INDEPENDENT_AMBULATORY_CARE_PROVIDER_SITE_OTHER): Payer: PPO | Admitting: Family Medicine

## 2022-03-25 ENCOUNTER — Other Ambulatory Visit (INDEPENDENT_AMBULATORY_CARE_PROVIDER_SITE_OTHER): Payer: PPO

## 2022-03-25 VITALS — BP 140/84 | HR 66 | Temp 97.6°F | Ht 76.0 in | Wt 221.0 lb

## 2022-03-25 DIAGNOSIS — E782 Mixed hyperlipidemia: Secondary | ICD-10-CM | POA: Diagnosis not present

## 2022-03-25 DIAGNOSIS — E559 Vitamin D deficiency, unspecified: Secondary | ICD-10-CM | POA: Diagnosis not present

## 2022-03-25 DIAGNOSIS — J069 Acute upper respiratory infection, unspecified: Secondary | ICD-10-CM | POA: Diagnosis not present

## 2022-03-25 DIAGNOSIS — R7303 Prediabetes: Secondary | ICD-10-CM

## 2022-03-25 DIAGNOSIS — R051 Acute cough: Secondary | ICD-10-CM

## 2022-03-25 DIAGNOSIS — I480 Paroxysmal atrial fibrillation: Secondary | ICD-10-CM | POA: Diagnosis not present

## 2022-03-25 DIAGNOSIS — I10 Essential (primary) hypertension: Secondary | ICD-10-CM

## 2022-03-25 DIAGNOSIS — R5383 Other fatigue: Secondary | ICD-10-CM | POA: Diagnosis not present

## 2022-03-25 LAB — CBC WITH DIFFERENTIAL/PLATELET
Basophils Absolute: 0 10*3/uL (ref 0.0–0.1)
Basophils Relative: 0.8 % (ref 0.0–3.0)
Eosinophils Absolute: 0 10*3/uL (ref 0.0–0.7)
Eosinophils Relative: 0.8 % (ref 0.0–5.0)
HCT: 42.5 % (ref 39.0–52.0)
Hemoglobin: 14.3 g/dL (ref 13.0–17.0)
Lymphocytes Relative: 28.2 % (ref 12.0–46.0)
Lymphs Abs: 1.3 10*3/uL (ref 0.7–4.0)
MCHC: 33.5 g/dL (ref 30.0–36.0)
MCV: 92.7 fl (ref 78.0–100.0)
Monocytes Absolute: 0.4 10*3/uL (ref 0.1–1.0)
Monocytes Relative: 9.1 % (ref 3.0–12.0)
Neutro Abs: 2.8 10*3/uL (ref 1.4–7.7)
Neutrophils Relative %: 61.1 % (ref 43.0–77.0)
Platelets: 200 10*3/uL (ref 150.0–400.0)
RBC: 4.59 Mil/uL (ref 4.22–5.81)
RDW: 14.1 % (ref 11.5–15.5)
WBC: 4.6 10*3/uL (ref 4.0–10.5)

## 2022-03-25 LAB — LIPID PANEL
Cholesterol: 169 mg/dL (ref 0–200)
HDL: 80.1 mg/dL (ref >39.00)
LDL Cholesterol: 80 mg/dL (ref 0–99)
NonHDL: 89.11
Total CHOL/HDL Ratio: 2
Triglycerides: 48 mg/dL (ref 0.0–149.0)
VLDL: 9.6 mg/dL (ref 0.0–40.0)

## 2022-03-25 LAB — COMPREHENSIVE METABOLIC PANEL
ALT: 25 U/L (ref 0–53)
AST: 23 U/L (ref 0–37)
Albumin: 4.2 g/dL (ref 3.5–5.2)
Alkaline Phosphatase: 77 U/L (ref 39–117)
BUN: 12 mg/dL (ref 6–23)
CO2: 29 mEq/L (ref 19–32)
Calcium: 9.1 mg/dL (ref 8.4–10.5)
Chloride: 103 mEq/L (ref 96–112)
Creatinine, Ser: 0.89 mg/dL (ref 0.40–1.50)
GFR: 85 mL/min (ref >60.00)
Glucose, Bld: 102 mg/dL — ABNORMAL HIGH (ref 70–99)
Potassium: 4.1 mEq/L (ref 3.5–5.1)
Sodium: 139 mEq/L (ref 135–145)
Total Bilirubin: 0.9 mg/dL (ref 0.2–1.2)
Total Protein: 6.8 g/dL (ref 6.0–8.3)

## 2022-03-25 LAB — VITAMIN D 25 HYDROXY (VIT D DEFICIENCY, FRACTURES): VITD: 37.46 ng/mL (ref 30.00–100.00)

## 2022-03-25 LAB — POC COVID19 BINAXNOW: SARS Coronavirus 2 Ag: NEGATIVE

## 2022-03-25 LAB — HEMOGLOBIN A1C: Hgb A1c MFr Bld: 5.9 % (ref 4.6–6.5)

## 2022-03-25 LAB — TSH: TSH: 1.55 u[IU]/mL (ref 0.35–5.50)

## 2022-03-25 MED ORDER — BENZONATATE 200 MG PO CAPS: 200.0000 mg | ORAL_CAPSULE | Freq: Two times a day (BID) | ORAL | 0 refills | Status: DC | PRN

## 2022-03-25 NOTE — Patient Instructions (Signed)
Your Covid test is negative.   You appear to have a viral illness at this point.   I recommend treating your symptoms and seeing how you are doing over the next 2-3 days.   Stay well hydrated Take Tylenol 500 mg or 650 mg as needed.   Mucinex for cough and congestion.  Throat/cough lozenges.  Flonase nasal spray if needed for nasal congestion.   I also prescribed a medication for cough called Tessalon. This is not sedating and works differently than over the counter cough medications.   Let me know if you are getting worse or not improving in the next 2-3 days.

## 2022-03-25 NOTE — Progress Notes (Signed)
Subjective:  [name] [name] is a 71 y.o. male who presents for a 4 day hx of fatigue, rhinorrhea, cough and general malaise. States 2 days ago his cough got much worse. He also has chest congestion. Cough is waking him up at night.   Denies fever, dizziness, chest pain, palpitations, shortness of breath, abdominal pain, N/V/D.  Denies hx of lung disease. He is not a smoker.   No other aggravating or relieving factors.  No other c/o.  ROS as in subjective.   Objective: Vitals:   03/25/22 1046  BP: (!) 140/84  Pulse: 66  Temp: 97.6 F (36.4 C)  SpO2: 99%    General appearance: Alert, WD/WN, no distress, mildly ill appearing                             Skin: warm, no rash                           Head: no sinus tenderness                            Eyes: conjunctiva normal, corneas clear, PERRLA                            Ears: pearly TMs, external ear canals normal                          Nose: septum midline, turbinates swollen, with erythema and clear discharge             Mouth/throat: MMM, tongue normal, mild pharyngeal erythema                           Neck: supple, no adenopathy, no thyromegaly, nontender                          Heart: RRR,                          Lungs: CTA bilaterally, no wheezes, rales, or rhonchi      Assessment: Viral upper respiratory tract infection  Acute cough - Plan: POC COVID-19, benzonatate (TESSALON) 200 MG capsule  Fatigue, unspecified type - Plan: POC COVID-19   Plan: Negative Covid test.  Discussed diagnosis and treatment of viral URI.  Suggested symptomatic OTC remedies. Nasal saline spray or Flonase for congestion.  Tylenol OTC prn.  Call/return in 2-3 days if symptoms aren't resolving.

## 2022-03-31 ENCOUNTER — Encounter: Payer: Self-pay | Admitting: Internal Medicine

## 2022-03-31 NOTE — Progress Notes (Unsigned)
Subjective:    Patient ID: [name] [name], male    DOB: 04/01/1951, 71 y.o.   MRN: [id number]     HPI [name] [name] is here for a physical exam.   He had his blood work done prior-reviewed.   Has cough - it is getting better slowly.    Varicose veins on right leg - visible at times.   Has some right knee pain when standing - feels mechanical - saw ortho - it was not felt to be the knee.  Unlikely varicose veins - will watch  right hip pain - interested in PT.  Medications and allergies reviewed with patient and updated if appropriate.  Current Outpatient Medications on File Prior to Visit  Medication Sig Dispense Refill   apixaban (ELIQUIS) 5 MG TABS tablet Take 1 tablet by mouth twice daily 180 tablet 1   irbesartan (AVAPRO) 75 MG tablet Take 1 tablet by mouth once daily 90 tablet 3   metoprolol succinate (TOPROL-XL) 25 MG 24 hr tablet Take 1 tablet by mouth once daily 90 tablet 3   VITAMIN D, CHOLECALCIFEROL, PO Take by mouth.     No current facility-administered medications on file prior to visit.    Review of Systems  Constitutional:  Negative for chills and fever.  Eyes:  Negative for visual disturbance.  Respiratory:  Negative for cough, shortness of breath and wheezing.   Cardiovascular:  Positive for leg swelling (occ - trace). Negative for chest pain and palpitations.  Gastrointestinal:  Negative for abdominal pain, blood in stool, constipation, diarrhea and nausea.       No gerd  Genitourinary:  Negative for difficulty urinating, dysuria and hematuria.  Musculoskeletal:  Positive for arthralgias (right hip) and back pain (better- doing PT).  Skin:  Negative for rash.  Neurological:  Negative for light-headedness and headaches.  Psychiatric/Behavioral:  Negative for dysphoric mood. The patient is not nervous/anxious.        Objective:   Vitals:   04/01/22 1014  BP: 130/80  Pulse: 65  Temp: 98 F (36.7 C)  SpO2: 98%   Filed Weights   04/01/22 1014   Weight: 222 lb (100.7 kg)   Body mass index is 27.02 kg/m.  BP Readings from Last 3 Encounters:  04/01/22 130/80  03/25/22 (!) 140/84  02/06/22 136/84    Wt Readings from Last 3 Encounters:  04/01/22 222 lb (100.7 kg)  03/25/22 221 lb (100.2 kg)  02/06/22 223 lb (101.2 kg)      Physical Exam Constitutional: He appears well-developed and well-nourished. No distress.  HENT:  Head: Normocephalic and atraumatic.  Right Ear: External ear normal.  Left Ear: External ear normal.  Mouth/Throat: Oropharynx is clear and moist.  Normal ear canals and TM b/l  Eyes: Conjunctivae and EOM are normal.  Neck: Neck supple. No tracheal deviation present. No thyromegaly present.  No carotid bruit  Cardiovascular: Normal rate, regular rhythm, normal heart sounds and intact distal pulses.   No murmur heard. Pulmonary/Chest: Effort normal and breath sounds normal. No respiratory distress. He has no wheezes. He has no rales.  Abdominal: Soft. He exhibits no distension. There is no tenderness.  Genitourinary: deferred  Musculoskeletal: He exhibits no edema.  Lymphadenopathy:   He has no cervical adenopathy.  Skin: Skin is warm and dry. He is not diaphoretic.  Psychiatric: He has a normal mood and affect. His behavior is normal.         Assessment & Plan:   Physical exam: Screening  blood work  ordered Exercise   regular Weight okay for age Substance abuse   none   Reviewed recommended immunizations.   Health Maintenance  Topic Date Due   TETANUS/TDAP  04/02/2023 (Originally 10/01/2021)   COVID-19 Vaccine (7 - Pfizer risk series) 04/23/2022   Medicare Annual Wellness (AWV)  12/20/2022   COLONOSCOPY (Pts 45-60yr Insurance coverage will need to be confirmed)  05/28/2026   Pneumonia Vaccine 71 Years old  Completed   Hepatitis C Screening  Completed   Zoster Vaccines- Shingrix  Completed   HPV VACCINES  Aged Out     See Problem List for Assessment and Plan of chronic medical  problems.

## 2022-03-31 NOTE — Patient Instructions (Addendum)
Medications changes include :   None    A referral was ordered for physical therapy for right hip pain.     Someone will call you to schedule an appointment.    Return in about 1 year (around 04/02/2023) for Physical Exam.    Health Maintenance, Male Adopting a healthy lifestyle and getting preventive care are important in promoting health and wellness. Ask your health care provider about: The right schedule for you to have regular tests and exams. Things you can do on your own to prevent diseases and keep yourself healthy. What should I know about diet, weight, and exercise? Eat a healthy diet  Eat a diet that includes plenty of vegetables, fruits, low-fat dairy products, and lean protein. Do not eat a lot of foods that are high in solid fats, added sugars, or sodium. Maintain a healthy weight Body mass index (BMI) is a measurement that can be used to identify possible weight problems. It estimates body fat based on height and weight. Your health care provider can help determine your BMI and help you achieve or maintain a healthy weight. Get regular exercise Get regular exercise. This is one of the most important things you can do for your health. Most adults should: Exercise for at least 150 minutes each week. The exercise should increase your heart rate and make you sweat (moderate-intensity exercise). Do strengthening exercises at least twice a week. This is in addition to the moderate-intensity exercise. Spend less time sitting. Even light physical activity can be beneficial. Watch cholesterol and blood lipids Have your blood tested for lipids and cholesterol at 73 years of age, then have this test every 5 years. You may need to have your cholesterol levels checked more often if: Your lipid or cholesterol levels are high. You are older than 73 years of age. You are at high risk for heart disease. What should I know about cancer screening? Many types of cancers can be  detected early and may often be prevented. Depending on your health history and family history, you may need to have cancer screening at various ages. This may include screening for: Colorectal cancer. Prostate cancer. Skin cancer. Lung cancer. What should I know about heart disease, diabetes, and high blood pressure? Blood pressure and heart disease High blood pressure causes heart disease and increases the risk of stroke. This is more likely to develop in people who have high blood pressure readings or are overweight. Talk with your health care provider about your target blood pressure readings. Have your blood pressure checked: Every 3-5 years if you are 11-59 years of age. Every year if you are 81 years old or older. If you are between the ages of 38 and 27 and are a current or former smoker, ask your health care provider if you should have a one-time screening for abdominal aortic aneurysm (AAA). Diabetes Have regular diabetes screenings. This checks your fasting blood sugar level. Have the screening done: Once every three years after age 66 if you are at a normal weight and have a low risk for diabetes. More often and at a younger age if you are overweight or have a high risk for diabetes. What should I know about preventing infection? Hepatitis B If you have a higher risk for hepatitis B, you should be screened for this virus. Talk with your health care provider to find out if you are at risk for hepatitis B infection. Hepatitis C Blood testing is recommended for: Everyone  born from [birth year] through 1967. Anyone with known risk factors for hepatitis C. Sexually transmitted infections (STIs) You should be screened each year for STIs, including gonorrhea and chlamydia, if: You are sexually active and are younger than 73 years of age. You are older than 73 years of age and your health care provider tells you that you are at risk for this type of infection. Your sexual activity has changed  since you were last screened, and you are at increased risk for chlamydia or gonorrhea. Ask your health care provider if you are at risk. Ask your health care provider about whether you are at high risk for HIV. Your health care provider may recommend a prescription medicine to help prevent HIV infection. If you choose to take medicine to prevent HIV, you should first get tested for HIV. You should then be tested every 3 months for as long as you are taking the medicine. Follow these instructions at home: Alcohol use Do not drink alcohol if your health care provider tells you not to drink. If you drink alcohol: Limit how much you have to 0-2 drinks a day. Know how much alcohol is in your drink. In the [place] one drink equals one 12 oz bottle of beer (355 mL), one 5 oz glass of wine (148 mL), or one 1 oz glass of hard liquor (44 mL). Lifestyle Do not use any products that contain nicotine or tobacco. These products include cigarettes, chewing tobacco, and vaping devices, such as e-cigarettes. If you need help quitting, ask your health care provider. Do not use street drugs. Do not share needles. Ask your health care provider for help if you need support or information about quitting drugs. General instructions Schedule regular health, dental, and eye exams. Stay current with your vaccines. Tell your health care provider if: You often feel depressed. You have ever been abused or do not feel safe at home. Summary Adopting a healthy lifestyle and getting preventive care are important in promoting health and wellness. Follow your health care provider's instructions about healthy diet, exercising, and getting tested or screened for diseases. Follow your health care provider's instructions on monitoring your cholesterol and blood pressure. This information is not intended to replace advice given to you by your health care provider. Make sure you discuss any questions you have with your health care  provider. Document Revised: 09/24/2020 Document Reviewed: 09/24/2020 Elsevier Patient Education  [place].

## 2022-04-01 ENCOUNTER — Ambulatory Visit (INDEPENDENT_AMBULATORY_CARE_PROVIDER_SITE_OTHER): Payer: PPO | Admitting: Internal Medicine

## 2022-04-01 VITALS — BP 130/80 | HR 65 | Temp 98.0°F | Ht 76.0 in | Wt 222.0 lb

## 2022-04-01 DIAGNOSIS — I10 Essential (primary) hypertension: Secondary | ICD-10-CM | POA: Diagnosis not present

## 2022-04-01 DIAGNOSIS — M549 Dorsalgia, unspecified: Secondary | ICD-10-CM

## 2022-04-01 DIAGNOSIS — I480 Paroxysmal atrial fibrillation: Secondary | ICD-10-CM | POA: Diagnosis not present

## 2022-04-01 DIAGNOSIS — R7303 Prediabetes: Secondary | ICD-10-CM | POA: Diagnosis not present

## 2022-04-01 DIAGNOSIS — I83892 Varicose veins of left lower extremities with other complications: Secondary | ICD-10-CM

## 2022-04-01 DIAGNOSIS — E782 Mixed hyperlipidemia: Secondary | ICD-10-CM | POA: Diagnosis not present

## 2022-04-01 DIAGNOSIS — Z0000 Encounter for general adult medical examination without abnormal findings: Secondary | ICD-10-CM | POA: Diagnosis not present

## 2022-04-01 DIAGNOSIS — E559 Vitamin D deficiency, unspecified: Secondary | ICD-10-CM | POA: Diagnosis not present

## 2022-04-01 DIAGNOSIS — M25551 Pain in right hip: Secondary | ICD-10-CM

## 2022-04-01 NOTE — Assessment & Plan Note (Signed)
Chronic Regular exercise and healthy diet encouraged Lab Results  Component Value Date   LDLCALC 80 03/25/2022    Continue lifestyle control

## 2022-04-01 NOTE — Assessment & Plan Note (Signed)
Chronic Lab Results  Component Value Date   HGBA1C 5.9 03/25/2022    Low sugar / carb diet Stressed regular exercise

## 2022-04-01 NOTE — Assessment & Plan Note (Signed)
Prominent but not causing symptoms  Monitor at this times only

## 2022-04-01 NOTE — Assessment & Plan Note (Signed)
Referred to PT

## 2022-04-01 NOTE — Assessment & Plan Note (Signed)
Chronic Vitamin D in normal range

## 2022-04-01 NOTE — Assessment & Plan Note (Signed)
Chronic Blood pressure well controlled CMP reviewed Continue Avapro 75 mg daily, metoprolol XL 25 mg daily

## 2022-04-01 NOTE — Assessment & Plan Note (Signed)
Improved with PT.

## 2022-04-01 NOTE — Assessment & Plan Note (Signed)
Chronic Following with cardiology On metoprolol XL 25 mg daily and Eliquis 50 mg twice daily CBC, CMP, tsh within normal limits

## 2022-04-02 ENCOUNTER — Ambulatory Visit: Payer: PPO | Admitting: Physical Therapy

## 2022-04-02 ENCOUNTER — Encounter: Payer: Self-pay | Admitting: Physical Therapy

## 2022-04-02 DIAGNOSIS — M6281 Muscle weakness (generalized): Secondary | ICD-10-CM

## 2022-04-02 DIAGNOSIS — M62830 Muscle spasm of back: Secondary | ICD-10-CM

## 2022-04-02 DIAGNOSIS — R293 Abnormal posture: Secondary | ICD-10-CM | POA: Diagnosis not present

## 2022-04-02 DIAGNOSIS — M546 Pain in thoracic spine: Secondary | ICD-10-CM

## 2022-04-02 NOTE — Therapy (Signed)
OUTPATIENT PHYSICAL THERAPY TREATMENT / DISCHARGE SUMMARY   Patient Name: [name] [name] MRN: [id number] DOB:June 26, 1950, 71 y.o., male Today's Date: 04/02/2022   PT End of Session - 04/02/22 1017     Visit Number 8    Date for PT Re-Evaluation 04/03/22    Authorization Type [employer]    PT Start Time 1017    PT Stop Time 1100    PT Time Calculation (min) 43 min    Activity Tolerance Patient tolerated treatment well    Behavior During Therapy WFL for tasks assessed/performed                  Past Medical History:  Diagnosis Date   Atrial fibrillation with RVR ([place])    Converted to NSR with lopressor   Benign prostatic hypertrophy with elevated PSA    Dr Alinda Money, High grade PIN w/o malignancy   History of colonic polyps 2003, 2007   hyperplastic   Hx of colonoscopy 2012   neg   Hyperlipidemia    Hypertension    Retinal vascular occlusion    Dr Delman Cheadle   Past Surgical History:  Procedure Laterality Date   APPENDECTOMY     COLONOSCOPY W/ POLYPECTOMY      X2 , Dr Deatra Ina   ENDOVENOUS ABLATION SAPHENOUS VEIN W/ LASER Left 09/29/2017   endovenous laser ablation left greater saphenous vein by Tinnie Gens MD    HERNIA REPAIR  [id number]    Dr Margot Chimes   lichenoid keratosis resected  8/14   LIPOMA EXCISION     X 5   PROSTATE BIOPSY  09/2010   Dr Alinda Money   stab phlebectomy  Left 01/27/2018   stab phlebectomy 10-20 incisions left leg by Ruta Hinds MD   Patient Active Problem List   Diagnosis Date Noted   Right hip pain 04/01/2022   Poor posture 02/06/2022   Mid back pain 02/06/2022   Right foot pain 02/06/2022   Secondary hypercoagulable state ([place]) 03/28/2021   Ingrown toenail of right foot 03/23/2019   Current use of long term anticoagulation 01/20/2018   EtOH dependence ([place]) 07/30/2017   Paroxysmal atrial fibrillation ([place]) 07/29/2017   Varicose veins of left lower extremity with complications 59/97/7414   Greater trochanteric bursitis of right hip  01/02/2017   Prediabetes 09/20/2016   Rotator cuff syndrome of right shoulder 09/27/2015   Vitamin D deficiency 23/95/3202   Lichenoid keratosis 33/43/5686   Superficial thrombosis of leg 03/28/2013   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   Inguinal hernia recurrent unilateral 08/10/2009   History of colonic polyps 05/15/2008   Essential hypertension 05/15/2008   HYPERLIPIDEMIA 05/12/2007   Elevated prostate specific antigen (PSA) 05/12/2007    PCP: Binnie Rail, MD   REFERRING PROVIDER: Binnie Rail, MD   REFERRING DIAG:  M54.9 (ICD-10-CM) - Mid back pain  R29.3 (ICD-10-CM) - Poor posture    THERAPY DIAG:  Abnormal posture  Pain in thoracic spine  Muscle weakness (generalized)  Muscle spasm of back  RATIONALE FOR EVALUATION AND TREATMENT: Rehabilitation  ONSET DATE: 2-3 months for thoracic back pain  NEXT MD VISIT: 04/01/2022   SUBJECTIVE:  SUBJECTIVE STATEMENT: Pt reports he has not been feeling well due to cold symptoms and has not been as consistent with his HEP but feels that he is doing well related to his back and posture. His R hip continues to bother him and his MD has written a new PT referral for his hip.  PAIN:  Are you having pain? No  PERTINENT HISTORY: Central blindness; HTN; paroxysmal afib with RVR; chronic anticoagulation; HLD; R foot pain; R greater trochanteric bursitis; R shoulder RTC syndrome; Prediabetes; ETOH dependence  PRECAUTIONS: None  WEIGHT BEARING RESTRICTIONS: No  FALLS:  Has patient fallen in last 6 months? Yes. Number of falls 1 - slip while playing golf  LIVING ENVIRONMENT: Lives with: lives with their spouse Lives in: House/apartment Stairs: Yes: Internal: 14 steps; on left going up and External: 3-4 steps; none Has following equipment at home:  Single point cane, Walker - 2 wheeled, and shower chair  OCCUPATION: Retired  PLOF: Independent and Leisure: golf, yard work, gym 3x/wk intermittently  PATIENT GOALS: "To avoid a stooped posture ("banana back")."   OBJECTIVE:   DIAGNOSTIC FINDINGS:  02/06/22 - Thoracic spine x-ray:  IMPRESSION: No fracture or dislocation of the thoracic spine. Normal thoracic kyphosis. Mild multilevel disc space height loss and osteophytosis with subtle bridging osteophytosis at multiple levels of the mid and lower thoracic spine suggesting DISH. 02/06/22 - Cervical spine x-ray: IMPRESSION: No fracture or static subluxation of the cervical spine. Moderate disc space height loss and osteophytosis from C5 through C7 with otherwise mild disc space height loss. Severe facet degenerative change at multiple levels, predominantly in the upper cervical spine. Associated bilateral bony neural foraminal stenosis as above. Cervical disc and neural foraminal pathology may be further evaluated by MRI if indicated by neurologically localizing signs and symptoms. 02/11/22 - R hip/pelvis x-ray: An AP pelvis and lateral right hip shows mild arthritic findings which is slight joint space narrowing and a small osteophyte. 02/11/22 - R knee x-ray: 2 views of the right knee show well-maintained medial lateral compartments.  There is narrowing of the patellofemoral joint.  There are changes in the metaphyseal area of the femur that are chronic and appear to be consistent with an enchondroma.  This is an incidental finding.  PATIENT SURVEYS:  Modified Oswestry 2 / 50 = 4.0%   SCREENING FOR RED FLAGS: Bowel or bladder incontinence: No Spinal tumors: No Cauda equina syndrome: No Compression fracture: No Abdominal aneurysm: No  COGNITION:  Overall cognitive status: Within functional limits for tasks assessed     SENSATION: WFL  MUSCLE LENGTH: Hamstrings: severe tight B ITB: mod tight B Piriformis: mild/mod tight  B Hip flexors: mod/severe tight R>L Quads: mod tight R>L  POSTURE:  rounded shoulders, forward head, and increased thoracic kyphosis  PALPATION: Denies TTP in thoracic paraspinals or periscapular muscles  THORACOLUMBAR ROM:   Active  A/PROM  eval  Flexion Hands to upper shins - tight HS  Extension 40% limited  Right lateral flexion Hand to 1" above knee  Left lateral flexion Hand to 1" above knee  Right rotation WFL  Left rotation WFL   (Blank rows = not tested)  UPPER EXTREMITY ROM: WFL but L shoulder slightly more limited than R  LOWER EXTREMITY ROM:    Limited due to musculature tightness  UPPER EXTREMITY MMT:  MMT Right eval Left eval Right 04/02/22 Left 04/02/22  Shoulder flexion _0 Shoulder extension _1 Shoulder abduction 5 5 5  5  Shoulder adduction      Shoulder internal rotation _0 Shoulder external rotation 4 4 4+ 5  Middle trapezius      Lower trapezius      Elbow flexion      Elbow extension      Wrist flexion      Wrist extension      Wrist ulnar deviation      Wrist radial deviation      Wrist pronation      Wrist supination      Grip strength       (Blank rows = not tested)   LOWER EXTREMITY MMT:    MMT Right eval Left eval Right 04/02/22 Left 04/02/22  Hip flexion 4+ 4+ 4+ 5  Hip extension _1 Hip abduction _2 Hip adduction 4+ 4+ 4+ 4+  Hip internal rotation 4+ 4+ 5 5  Hip external rotation 4+ 4+ 4 4+  Knee flexion _3 Knee extension _4 Ankle dorsiflexion _5 4+  Ankle plantarflexion 5- 5- 5- 5-  Ankle inversion      Ankle eversion       (Blank rows = not tested)   TODAY'S TREATMENT:  04/02/22 THERAPEUTIC EXERCISE: to improve flexibility, strength and mobility.  Verbal and tactile cues throughout for technique. UBE - L3.0 x 6 min (3' each fwd & back) in standing - focusing on postural awareness Cervical retraction with 2 finger guidance at chin x10 Cervical retraction with RTB  10 x 3" HEP review - pt denies need for consolidation - maintenance level frequency discussed to prevent recurrence of symptoms  THERAPEUTIC ACTIVITIES: ROM MMT Goal assessment   03/20/22 THERAPEUTIC EXERCISE: to improve flexibility, strength and mobility.  Verbal and tactile cues throughout for technique. UBE - L3.0 x 6 min (3' each fwd & back) in standing - focusing on postural awareness Hooklying vertically on foam roll (pillow under head to support neck):    Pec stretches in T, Y, and W positions x 15-30"    Scap retraction + GTB horiz ABD x 10    Scap retraction + GTB horiz ABD diagonals x 10    Scap retraction + GTB shoulder ER x 10    Chin tuck into pillow on FR 10 x 3" Thoracic extension mobilization over 6" FR, adjusting FR to levels of greatest restriction Thoracic mobilization/rolling of posterior thoracic muscles demonstrated but not attempted Seated chin tuck with 2 finger guidance 10 x 3"  MANUAL THERAPY: To promote improved flexibility and increased ROM. Manual cervical distraction with pt performing resisted chin tuck   03/10/22 THERAPEUTIC EXERCISE: to improve flexibility, strength and mobility.  Verbal and tactile cues throughout for technique. UBE - L3.0 x 6 min (3' each fwd & back) Wall angels x 10 L pec stretch hand behind head supine 3x15"  Supine shoulder flexion with 1# weight x 10 Supine shoulder scpation with 1# weight x 10 Ys over foam roll x 10  Manual Therapy: STM to L pec minor, lats with pin and stretch   PATIENT EDUCATION:  Education details: HEP review and recommended frequency for ongoing HEP at discharge to prevent loss of gains achieved with PT  Person educated: Patient Education method: Explanation Education comprehension: verbalized understanding   HOME EXERCISE PROGRAM: Access Code: [email or web address] URL: [email or web address] Date: 03/20/2022 Prepared by: Annie Paras  Exercises - Hooklying Hamstring Stretch with Strap   -  2 x daily - 7 x weekly - 3 reps - 30 sec hold - Supine Quadriceps Stretch with Strap on Table  - 2 x daily - 7 x weekly - 3 reps - 30 sec hold - Doorway Pec Stretch at 60 Elevation  - 2 x daily - 7 x weekly - 3 reps - 30 sec hold - Doorway Pec Stretch at 90 Degrees Abduction  - 2 x daily - 7 x weekly - 3 reps - 30 sec hold - Seated Thoracic Lumbar Extension with Pectoralis Stretch  - 2 x daily - 7 x weekly - 2 sets - 10 reps - 5 sec hold - Standing Lumbar Extension at Wall - Forearms  - 2 x daily - 7 x weekly - 2 sets - 10 reps - 5 sec hold - Standing Thoracic Extension at Wall  - 2 x daily - 7 x weekly - 2 sets - 10 reps - 5 sec hold - Standing Bilateral Low Shoulder Row with Anchored Resistance  - 1 x daily - 3 x weekly - 2 sets - 10 reps - 5 sec hold - Scapular Retraction with Resistance Advanced  - 1 x daily - 3 x weekly - 2 sets - 10 reps - 5 sec hold - Standing Lat Pull Down with Resistance - Elbows Bent  - 1 x daily - 3 x weekly - 2 sets - 10 reps - 3 sec hold - Standing Shoulder Horizontal Abduction with Resistance  - 1 x daily - 3 x weekly - 2 sets - 10 reps - 3 sec hold - Standing Shoulder Diagonal Horizontal Abduction 60/120 Degrees with Resistance  - 1 x daily - 3 x weekly - 2 sets - 10 reps - 3 sec hold - Shoulder External Rotation and Scapular Retraction with Resistance  - 1 x daily - 3 x weekly - 2 sets - 10 reps - 3-5 sec hold - Sidelying Thoracic Rotation with Open Book  - 1 x daily - 7 x weekly - 2 sets - 10 reps - 5 sec hold - Standing Pectoral Release with Ball at Wall  - 1 x daily - 7 x weekly - 1-2 min hold - Standing Posterior Deltoid Release with Ball at [employer]  - 1 x daily - 7 x weekly - 1-2 min hold - Seated Passive Cervical Retraction  - 2 x daily - 7 x weekly - 2 sets - 10 reps - 3-5 sec hold - Thoracic Mobilization with Hands Behind Head on Foam Roll  - 1 x daily - 7 x weekly - 2 sets - 10 reps - 3 sec hold - Thoracic Mobilization on Foam Roll  - 1 x daily - 7 x weekly  - 2 sets - 10 reps - 3 sec hold - Supine Chest Stretch on Foam Roll  - 1 x daily - 7 x weekly - 2 sets - 10 reps - 3 sec hold    ASSESSMENT:  CLINICAL IMPRESSION: [name] [name] denies any recent thoracic back pain and demonstrates improved posture.  He notes improved flexibility and range of motion with trunk motions including being able to turn to look behind him when driving and reaching down to pick things up from the floor.  His overall proximal upper extremity strength is now 4+ to 5/5, however mild weakness still evident in proximal lower extremity muscles at hips.  He reports he has purchased a foam roller for home use and finds this helpful.  HEP reviewed with patient denying  concerns other than difficulty recognizing if he is achieving proper cervical retraction with chin tuck.  Confirmed proper technique and provided RTB to add resistance for further strengthening of postural cervical muscles.  All goals now met with the exception of hip strength, however patient has obtained a new referral from his PCP to have physical therapy address his recent right hip pain therefore we will be able to address this in the future episode related to his hip.  Patient feels comfortable with plan to transition to home exercise program and discharge from physical therapy for the current episode, with new evaluation scheduled on 04/28/2022 to address his right hip pain.  OBJECTIVE IMPAIRMENTS: decreased activity tolerance, decreased mobility, decreased strength, hypomobility, increased fascial restrictions, impaired perceived functional ability, increased muscle spasms, impaired flexibility, improper body mechanics, postural dysfunction, and pain.   ACTIVITY LIMITATIONS: sitting and sleeping  PARTICIPATION LIMITATIONS:  gym workout  PERSONAL FACTORS: Age, Fitness, Past/current experiences, Time since onset of injury/illness/exacerbation, and 3+ comorbidities: Central blindness; HTN; paroxysmal afib with RVR; chronic  anticoagulation; HLD; R foot pain; R greater trochanteric bursitis; R shoulder RTC syndrome; Prediabetes; ETOH dependence  are also affecting patient's functional outcome.   REHAB POTENTIAL: Good  CLINICAL DECISION MAKING: Stable/uncomplicated  EVALUATION COMPLEXITY: Low   GOALS: Goals reviewed with patient? Yes  SHORT TERM GOALS: Target date: 03/13/2022   Patient will be independent with initial HEP to improve outcomes and carryover.  Baseline:  Goal status: MET - 03/06/22  2.  Patient to verbalize understanding good spinal alignment/posturing and body mechanics needed for daily activities. Baseline:  Goal status: MET - 03/10/22  LONG TERM GOALS: Target date: 04/03/2022    Patient will be independent with ongoing/advanced HEP for self-management at home.  Baseline:  Goal status: MET - 04/02/22  2.  Patient will report 50-75% improvement in mid back pain to improve QOL.  Baseline:  Goal status: MET - 03/20/22  3.  Patient to demonstrate ability to achieve and maintain good spinal alignment/posturing and body mechanics needed for daily activities. Baseline:  Goal status: MET - 04/02/22  4.  Patient will demonstrate functional pain free thoracolumbar ROM to perform ADLs.   Baseline:  Goal status: MET - 04/02/22 - Pt notes improved ability to turn to look when driving and to pick things up from the floor; main restriction continues to be a result of HS tightness which we will continue to address as part of his PT episode for his hip  5.  Patient will demonstrate improved B proximal LE and B shoulder ER strength to >/= 4+ to 5-/5 for improved stability and ease of mobility . Baseline:  Goal status: PARTIALLY MET - 04/02/22 - Met for shoulders; mild weakness still evident in hips which can be addressed in his upcoming episode for his hip  6.  Patient will tolerate 30 min of sitting w/o increased mid back pain to improve comfort with daily positioning. Baseline:  Goal status:  MET - 03/20/22   PLAN: PT FREQUENCY: 2x/week  PT DURATION: 6 weeks  PLANNED INTERVENTIONS: Therapeutic exercises, Therapeutic activity, Neuromuscular re-education, Balance training, Gait training, Patient/Family education, Self Care, Joint mobilization, Dry Needling, Electrical stimulation, Spinal mobilization, Cryotherapy, Moist heat, Taping, Traction, Ionotophoresis 31m/ml Dexamethasone, Manual therapy, and Re-evaluation.  PLAN FOR NEXT SESSION: transition to HEP + discharge from PT for current episode; new eval scheduled for R hip on 04/28/22   PHYSICAL THERAPY DISCHARGE SUMMARY  Visits from Start of Care: 8  Current functional level related to  goals / functional outcomes:   Refer to above clinical impression and goal assessment.   Remaining deficits:   As above.  Ongoing right hip pain and hip weakness to be addressed with new therapy episode - eval scheduled for 04/28/2022.   Education / Equipment:   HEP, postural training  Patient agrees to discharge. Patient goals were mostly met. Patient is being discharged due to being pleased with the current functional level.  Percival Spanish, PT 04/02/2022, 1:52 PM

## 2022-04-25 NOTE — Therapy (Signed)
OUTPATIENT PHYSICAL THERAPY LOWER EXTREMITY EVALUATION   Patient Name: [name] [name] MRN: [id number] DOB:03/14/1951, 71 y.o., male Today's Date: 04/25/2022  END OF SESSION:   Past Medical History:  Diagnosis Date   Atrial fibrillation with RVR ([place])    Converted to NSR with lopressor   Benign prostatic hypertrophy with elevated PSA    Dr Alinda Money, High grade PIN w/o malignancy   History of colonic polyps 2003, 2007   hyperplastic   Hx of colonoscopy 2012   neg   Hyperlipidemia    Hypertension    Retinal vascular occlusion    Dr Delman Cheadle   Past Surgical History:  Procedure Laterality Date   APPENDECTOMY     COLONOSCOPY W/ POLYPECTOMY      X2 , Dr Deatra Ina   ENDOVENOUS ABLATION SAPHENOUS VEIN W/ LASER Left 09/29/2017   endovenous laser ablation left greater saphenous vein by Tinnie Gens MD    HERNIA REPAIR  [id number]    Dr Margot Chimes   lichenoid keratosis resected  8/14   LIPOMA EXCISION     X 5   PROSTATE BIOPSY  09/2010   Dr Alinda Money   stab phlebectomy  Left 01/27/2018   stab phlebectomy 10-20 incisions left leg by Ruta Hinds MD   Patient Active Problem List   Diagnosis Date Noted   Right hip pain 04/01/2022   Poor posture 02/06/2022   Mid back pain 02/06/2022   Right foot pain 02/06/2022   Secondary hypercoagulable state ([place]) 03/28/2021   Ingrown toenail of right foot 03/23/2019   Current use of long term anticoagulation 01/20/2018   EtOH dependence ([place]) 07/30/2017   Paroxysmal atrial fibrillation ([place]) 07/29/2017   Varicose veins of left lower extremity with complications 47/82/9562   Greater trochanteric bursitis of right hip 01/02/2017   Prediabetes 09/20/2016   Rotator cuff syndrome of right shoulder 09/27/2015   Vitamin D deficiency 13/12/6576   Lichenoid keratosis 46/96/2952   Superficial thrombosis of leg 03/28/2013   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   Inguinal hernia recurrent unilateral 08/10/2009   History of colonic polyps 05/15/2008   Essential  hypertension 05/15/2008   HYPERLIPIDEMIA 05/12/2007   Elevated prostate specific antigen (PSA) 05/12/2007    PCP: Binnie Rail, MD   REFERRING PROVIDER: Binnie Rail, MD   REFERRING DIAG:  [phone] (ICD-10-CM) - Right hip pain      THERAPY DIAG:  No diagnosis found.  Rationale for Evaluation and Treatment: Rehabilitation  ONSET DATE: ***  SUBJECTIVE:   SUBJECTIVE STATEMENT: ***  PERTINENT HISTORY: Afib, HTN, Greater Trochanteric Bursitis R hip, Prediabetes PAIN:  Are you having pain? {OPRCPAIN:27236}  PRECAUTIONS: {Therapy precautions:24002}  WEIGHT BEARING RESTRICTIONS: {Yes ***/No:24003}  FALLS:  Has patient fallen in last 6 months? {fallsyesno:27318}  LIVING ENVIRONMENT: Lives with: {OPRC lives with:25569::"lives with their family"} Lives in: {Lives in:25570} Stairs: {opstairs:27293} Has following equipment at home: {Assistive devices:23999}  OCCUPATION: ***  PLOF: {PLOF:24004}  PATIENT GOALS: ***  NEXT MD VISIT:   OBJECTIVE:   DIAGNOSTIC FINDINGS:  02/11/22 XR Hip Unilat w or w/o pelvis Findings:  An AP pelvis and lateral right hip shows mild arthritic findings which is  slight joint space narrowing and a small osteophyte.   02/11/22 XR Knee  Findings: 2 views of the right knee show well-maintained medial lateral  compartments.  There is narrowing of the patellofemoral joint.  There are changes in the metaphyseal area of the femur that are chronic and appear to be consistent with an enchondroma.  This is an incidental  finding.   PATIENT SURVEYS:  {rehab surveys:24030}  COGNITION: Overall cognitive status: {cognition:24006}     SENSATION: {sensation:27233}  EDEMA:  {edema:24020}  MUSCLE LENGTH: Hamstrings: Right *** deg; Left *** deg Marcello Moores test: Right *** deg; Left *** deg  POSTURE: {posture:25561}  PALPATION: ***  LUMBAR ROM:   {AROM/PROM:27142}  A/PROM  eval  Flexion   Extension   Right lateral flexion   Left  lateral flexion   Right rotation   Left rotation    (Blank rows = not tested)   LOWER EXTREMITY ROM:  {AROM/PROM:27142} ROM Right eval Left eval  Hip flexion    Hip extension    Hip abduction    Hip adduction    Hip internal rotation    Hip external rotation    Knee flexion    Knee extension    Ankle dorsiflexion    Ankle plantarflexion    Ankle inversion    Ankle eversion     (Blank rows = not tested)  LOWER EXTREMITY MMT:  MMT Right eval Left eval  Hip flexion    Hip extension    Hip abduction    Hip adduction    Hip internal rotation    Hip external rotation    Knee flexion    Knee extension    Ankle dorsiflexion    Ankle plantarflexion    Ankle inversion    Ankle eversion     (Blank rows = not tested)  LOWER EXTREMITY SPECIAL TESTS:  {LEspecialtests:26242}  FUNCTIONAL TESTS:  {Functional tests:24029}  GAIT: Distance walked: *** Assistive device utilized: {Assistive devices:23999} Level of assistance: {Levels of assistance:24026} Comments: ***   TODAY'S TREATMENT:                                                                                                                              DATE: ***    PATIENT EDUCATION:  Education details: *** Person educated: {Person educated:25204} Education method: {Education Method:25205} Education comprehension: {Education Comprehension:25206}  HOME EXERCISE PROGRAM: ***  ASSESSMENT:  CLINICAL IMPRESSION: Patient is a *** y.o. *** who was seen today for physical therapy evaluation and treatment for ***.   OBJECTIVE IMPAIRMENTS: {opptimpairments:25111}.   ACTIVITY LIMITATIONS: {activitylimitations:27494}  PARTICIPATION LIMITATIONS: {participationrestrictions:25113}  PERSONAL FACTORS: {Personal factors:25162} are also affecting patient's functional outcome.   REHAB POTENTIAL: {rehabpotential:25112}  CLINICAL DECISION MAKING: {clinical decision making:25114}  EVALUATION COMPLEXITY: {Evaluation  complexity:25115}   GOALS: Goals reviewed with patient? {yes/no:20286}  SHORT TERM GOALS: Target date: *** *** Baseline: Goal status: {GOALSTATUS:25110}  2.  *** Baseline:  Goal status: {GOALSTATUS:25110}  3.  *** Baseline:  Goal status: {GOALSTATUS:25110}  4.  *** Baseline:  Goal status: {GOALSTATUS:25110}  5.  *** Baseline:  Goal status: {GOALSTATUS:25110}  6.  *** Baseline:  Goal status: {GOALSTATUS:25110}  LONG TERM GOALS: Target date: ***  *** Baseline:  Goal status: {GOALSTATUS:25110}  2.  *** Baseline:  Goal status: {GOALSTATUS:25110}  3.  *** Baseline:  Goal status: {  GOALSTATUS:25110}  4.  *** Baseline:  Goal status: {GOALSTATUS:25110}  5.  *** Baseline:  Goal status: {GOALSTATUS:25110}  6.  *** Baseline:  Goal status: {GOALSTATUS:25110}   PLAN:  PT FREQUENCY: {rehab frequency:25116}  PT DURATION: {rehab duration:25117}  PLANNED INTERVENTIONS: {rehab planned interventions:25118::"Therapeutic exercises","Therapeutic activity","Neuromuscular re-education","Balance training","Gait training","Patient/Family education","Self Care","Joint mobilization"}  PLAN FOR NEXT SESSION: ***   Zeb Comfort, Student-PT 04/25/2022, 8:34 AM

## 2022-04-28 ENCOUNTER — Other Ambulatory Visit: Payer: Self-pay | Admitting: Cardiovascular Disease

## 2022-04-28 ENCOUNTER — Encounter: Payer: Self-pay | Admitting: Physical Therapy

## 2022-04-28 ENCOUNTER — Other Ambulatory Visit: Payer: Self-pay

## 2022-04-28 ENCOUNTER — Ambulatory Visit: Payer: PPO | Attending: Internal Medicine | Admitting: Physical Therapy

## 2022-04-28 DIAGNOSIS — R2681 Unsteadiness on feet: Secondary | ICD-10-CM | POA: Diagnosis not present

## 2022-04-28 DIAGNOSIS — R2689 Other abnormalities of gait and mobility: Secondary | ICD-10-CM | POA: Insufficient documentation

## 2022-04-28 DIAGNOSIS — M25551 Pain in right hip: Secondary | ICD-10-CM | POA: Insufficient documentation

## 2022-04-28 DIAGNOSIS — M6281 Muscle weakness (generalized): Secondary | ICD-10-CM | POA: Diagnosis not present

## 2022-04-30 ENCOUNTER — Encounter: Payer: Self-pay | Admitting: Physical Therapy

## 2022-04-30 ENCOUNTER — Ambulatory Visit: Payer: PPO | Admitting: Physical Therapy

## 2022-04-30 DIAGNOSIS — M6281 Muscle weakness (generalized): Secondary | ICD-10-CM

## 2022-04-30 DIAGNOSIS — M25551 Pain in right hip: Secondary | ICD-10-CM

## 2022-04-30 DIAGNOSIS — R972 Elevated prostate specific antigen [PSA]: Secondary | ICD-10-CM | POA: Diagnosis not present

## 2022-04-30 DIAGNOSIS — R2689 Other abnormalities of gait and mobility: Secondary | ICD-10-CM

## 2022-04-30 DIAGNOSIS — R2681 Unsteadiness on feet: Secondary | ICD-10-CM

## 2022-04-30 NOTE — Therapy (Signed)
OUTPATIENT PHYSICAL THERAPY LOWER EXTREMITY TREATMENT   Patient Name: [name] [name] MRN: [id number] DOB:1950-08-26, 71 y.o., male Today's Date: 04/30/2022  END OF SESSION:  PT End of Session - 04/30/22 1017     Visit Number 2    Date for PT Re-Evaluation 06/23/22    Authorization Type [employer]    PT Start Time 1017    PT Stop Time 1100    PT Time Calculation (min) 43 min    Activity Tolerance Patient tolerated treatment well    Behavior During Therapy WFL for tasks assessed/performed             Past Medical History:  Diagnosis Date   Atrial fibrillation with RVR ([place])    Converted to NSR with lopressor   Benign prostatic hypertrophy with elevated PSA    Dr Alinda Money, High grade PIN w/o malignancy   History of colonic polyps 2003, 2007   hyperplastic   Hx of colonoscopy 2012   neg   Hyperlipidemia    Hypertension    Retinal vascular occlusion    Dr Delman Cheadle   Past Surgical History:  Procedure Laterality Date   APPENDECTOMY     COLONOSCOPY W/ POLYPECTOMY      X2 , Dr Deatra Ina   ENDOVENOUS ABLATION SAPHENOUS VEIN W/ LASER Left 09/29/2017   endovenous laser ablation left greater saphenous vein by Tinnie Gens MD    HERNIA REPAIR  [id number]    Dr Margot Chimes   lichenoid keratosis resected  8/14   LIPOMA EXCISION     X 5   PROSTATE BIOPSY  09/2010   Dr Alinda Money   stab phlebectomy  Left 01/27/2018   stab phlebectomy 10-20 incisions left leg by Ruta Hinds MD   Patient Active Problem List   Diagnosis Date Noted   Right hip pain 04/01/2022   Poor posture 02/06/2022   Mid back pain 02/06/2022   Right foot pain 02/06/2022   Secondary hypercoagulable state ([place]) 03/28/2021   Ingrown toenail of right foot 03/23/2019   Current use of long term anticoagulation 01/20/2018   EtOH dependence ([place]) 07/30/2017   Paroxysmal atrial fibrillation ([place]) 07/29/2017   Varicose veins of left lower extremity with complications 15/52/0802   Greater trochanteric bursitis of right hip  01/02/2017   Prediabetes 09/20/2016   Rotator cuff syndrome of right shoulder 09/27/2015   Vitamin D deficiency 23/36/1224   Lichenoid keratosis 49/75/3005   Superficial thrombosis of leg 03/28/2013   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   Inguinal hernia recurrent unilateral 08/10/2009   History of colonic polyps 05/15/2008   Essential hypertension 05/15/2008   HYPERLIPIDEMIA 05/12/2007   Elevated prostate specific antigen (PSA) 05/12/2007    PCP: Binnie Rail, MD  REFERRING PROVIDER: Binnie Rail, MD  REFERRING DIAG:  [phone] (ICD-10-CM) - Right hip pain   THERAPY DIAG:  Pain in right hip  Other abnormalities of gait and mobility  Muscle weakness (generalized)  Unsteadiness on feet  RATIONALE FOR EVALUATION AND TREATMENT: Rehabilitation  ONSET DATE: 2-3 years on/off  NEXT MD VISIT: Unknown   SUBJECTIVE:   SUBJECTIVE STATEMENT: Pt states mild pain today and no other complaints, HEP is going well.   PERTINENT HISTORY: Afib, HTN, Greater Trochanteric Bursitis R hip, Prediabetes  PAIN:  Are you having pain? Yes: NPRS scale: 2/10 Pain location: side of the hip R LE Pain description: shooting, short, jolt Aggravating factors: walking  Relieving factors: not aware of anything  PRECAUTIONS: None  WEIGHT BEARING RESTRICTIONS: No  FALLS:  Has  patient fallen in last 6 months? Yes. Number of falls 1  LIVING ENVIRONMENT: Lives with: lives with their family Lives in: House/apartment Stairs: Yes: Internal: 17 steps; on left going up and External: 4 steps; none Has following equipment at home: None  OCCUPATION: retired  PLOF: Independent and Leisure: walking, golf, gym  PATIENT GOALS: "remove the constant pain and decrease jolting pain"   OBJECTIVE:   DIAGNOSTIC FINDINGS:  02/11/22 XR Hip Unilat w or w/o pelvis Findings:  An AP pelvis and lateral right hip shows mild arthritic findings which is slight joint space narrowing and a small osteophyte.   02/11/22  XR Knee  Findings: 2 views of the right knee show well-maintained medial lateral compartments.  There is narrowing of the patellofemoral joint.  There are changes in the metaphyseal area of the femur that are chronic and appear to be consistent with an enchondroma.  This is an incidental finding.   PATIENT SURVEYS:  LEFS 56 / 80 = 70.0 %  COGNITION: Overall cognitive status: Within functional limits for tasks assessed    SENSATION: WFL  MUSCLE LENGTH: Hamstrings: mod tightness ITB: min tightness Piriformis: mod tightness Hip flexors: mod tightness Quads: mod tightness Heelcord: NT  POSTURE:  No Significant postural limitations  PALPATION: Increased tenderness of R Greater trochanter  LUMBAR ROM:   Active  A/PROM  eval  Flexion 50% limited  Extension 25%  limited  Right lateral flexion 50% limited  Left lateral flexion 50% limted  Right rotation WFL   Left rotation WFL   (Blank rows = not tested)  LOWER EXTREMITY ROM:  Grossly WFL  LOWER EXTREMITY MMT:  MMT Right eval Left eval  Hip flexion 4 4  Hip extension 4 4  Hip abduction 4 4  Hip adduction 4- 4-  Hip internal rotation 4+ 4+  Hip external rotation 4+ 4+  Knee flexion 5 5  Knee extension 5 5  Ankle dorsiflexion 5 5  Ankle plantarflexion    Ankle inversion    Ankle eversion     (Blank rows = not tested)  LOWER EXTREMITY SPECIAL TESTS:  Hip special tests: Saralyn Pilar (FABER) test: positive , Trendelenburg test: negative, and Hip scouring test: negative  FUNCTIONAL TESTS: *test at next visit Functional gait assessment: NT  GAIT: Distance walked: 90 ft Assistive device utilized: None Level of assistance: Complete Independence Comments: slight L side hip drop/ R Trendelenburg   TODAY'S TREATMENT:                                                                                                                              DATE:  04/30/22 THERAPEUTIC EXERCISE: to improve flexibility, strength and  mobility.  Verbal and tactile cues throughout for technique. NuStep L5 x 6 min Seated HS Stretch R/L 1x30 Seated Piriformis Stretch R/L 1x30 Seated forward, lateral flexion with Red Swiss ball 1x10 Supine Isometric Hip Abduction Single Leg R/L GTB- cues to hold for 5-10 seconds Glute  bridges GTB 1x10 with 5 sec hold- cues to keep tension on the band throughout Supine BLE alt marches with GTB R/L 2x10  THERAPEUTIC ACTIVITIES: FGA: 26/30   04/28/22  Initial eval Seated Piriformis Stretch, bringing knee to chest R/L 1x30 sec Supine Single Leg Hip Abduction with RTB R/L 1x10  PATIENT EDUCATION:  Education details: HEP update - glute bridges and HEP review Person educated: Patient Education method: Explanation, Demonstration, Tactile cues, Verbal cues, and Handouts Education comprehension: verbalized understanding and returned demonstration  HOME EXERCISE PROGRAM: Access Code: [email or web address] URL: [email or web address] Date: 04/30/2022 Prepared by: Zeb Comfort  Exercises - Hooklying Hamstring Stretch with Strap  - 2 x daily - 7 x weekly - 3 reps - 30 sec hold - Supine Quadriceps Stretch with Strap on Table  - 2 x daily - 7 x weekly - 3 reps - 30 sec hold - Seated Piriformis Stretch  - 2 x daily - 7 x weekly - 3 sets - 30 sec hold - Hooklying Isometric Clamshell  - 1 x daily - 3-4 x weekly - 3 sets - 10 reps - Supine Bridge with Resistance Band  - 1 x daily - 5 x weekly - 3 sets - 10 reps - 5 hold - Supine March with Resistance Band  - 1 x daily - 5 x weekly - 3 sets - 10 reps - 5 hold  ASSESSMENT:  CLINICAL IMPRESSION: [name] [name] presents today with slight hip pain but no other complaints. This session completed assessment of FGA to look at dynamic balance and he scored 26/30, difficulty performing dynamic activities with decreased BOS. This session also focused on TE to improve hip mobility, muscle length, and progressive strengthening of proximal hip musculature. He was able  to tolerate the session well but is still restricted by tight proximal hip muscles. He will benefit from continued skilled therapy to address hip mobility, strength, and dynamic balance with decreased BOS.   OBJECTIVE IMPAIRMENTS: Abnormal gait, decreased activity tolerance, decreased balance, decreased coordination, decreased endurance, decreased mobility, difficulty walking, decreased ROM, decreased strength, hypomobility, increased fascial restrictions, impaired perceived functional ability, impaired flexibility, improper body mechanics, postural dysfunction, and pain.   ACTIVITY LIMITATIONS: bending, sitting, standing, squatting, stairs, and locomotion level  PARTICIPATION LIMITATIONS: meal prep, cleaning, laundry, shopping, community activity, yard work, and walking, gym, golfing  PERSONAL FACTORS: Time since onset of injury/illness/exacerbation and 1-2 comorbidities: Afib/HTN, Greater Trochanteric Bursitis R hip, Prediabetes  are also affecting patient's functional outcome.   REHAB POTENTIAL: Good  CLINICAL DECISION MAKING: Stable/uncomplicated  EVALUATION COMPLEXITY: Low   GOALS: Goals reviewed with patient? Yes  SHORT TERM GOALS: Target date: 05/26/22 Pt will be independent with initial HEP.  Baseline: Goal status: IN PROGRESS  LONG TERM GOALS: Target date: 06/23/22  Pt will be independent with advanced/ongoing HEP to improve outcomes/carryover.  Baseline:  Goal status: IN PROGRESS  2.  Pt will report at least 75% improvement in R hip pain to improve QOL.  Baseline:  Goal status: IN PROGRESS  3.  Pt will score at least 67 on LEFS to demonstrate improved functional ability. Baseline: 56 / 80 = 70.0 % Goal status: IN PROGRESS  4.  Pt will score >/= 19/30 on FGA to decrease risk of falls.  Baseline:  Goal status: IN PROGRESS  5.  Pt will amb >10 minutes with decreased R Trendelenburg without increased pain to access community or play golf. Baseline:  Goal status: IN  PROGRESS  6.  Pt will demonstrate reduced muscle tightness  of proximal hip musculature on BLE to </=mild tightness to improve mobility and ease of functional tasks.  Baseline:  Goal status: IN PROGRESS  7.  Pt will report >/= 2/4 when donning shoes/socks, squatting, and getting into and out of a car on LEFS to demonstrate decreased pain and improved functional ability.  Baseline:  Goal status: IN PROGRESS   PLAN:  PT FREQUENCY: 2x/week  PT DURATION: 8 weeks  PLANNED INTERVENTIONS: Therapeutic exercises, Therapeutic activity, Neuromuscular re-education, Balance training, Gait training, Patient/Family education, Self Care, Joint mobilization, Stair training, Aquatic Therapy, Dry Needling, Electrical stimulation, Cryotherapy, Moist heat, Ultrasound, Ionotophoresis '4mg'$ /ml Dexamethasone, Manual therapy, and Re-evaluation  PLAN FOR NEXT SESSION: Stretching of proximal hip muscles, progressive strengthening/loading of R hip abductors/ERs, balance, gait   Zeb Comfort, Student-PT 04/30/2022, 11:00 AM

## 2022-05-06 ENCOUNTER — Ambulatory Visit: Payer: PPO | Admitting: Physical Therapy

## 2022-05-06 ENCOUNTER — Encounter: Payer: Self-pay | Admitting: Physical Therapy

## 2022-05-06 DIAGNOSIS — M25551 Pain in right hip: Secondary | ICD-10-CM | POA: Diagnosis not present

## 2022-05-06 DIAGNOSIS — M6281 Muscle weakness (generalized): Secondary | ICD-10-CM

## 2022-05-06 DIAGNOSIS — R2689 Other abnormalities of gait and mobility: Secondary | ICD-10-CM

## 2022-05-06 DIAGNOSIS — R2681 Unsteadiness on feet: Secondary | ICD-10-CM

## 2022-05-06 NOTE — Therapy (Signed)
OUTPATIENT PHYSICAL THERAPY LOWER EXTREMITY TREATMENT   Patient Name: [name] [name] MRN: [id number] DOB:Dec 22, 1950, 71 y.o., male Today's Date: 05/06/2022  END OF SESSION:  PT End of Session - 05/06/22 1013     Visit Number 3    Date for PT Re-Evaluation 06/23/22    Authorization Type [employer]    PT Start Time 1013    PT Stop Time 1055    PT Time Calculation (min) 42 min    Activity Tolerance Patient tolerated treatment well    Behavior During Therapy WFL for tasks assessed/performed             Past Medical History:  Diagnosis Date   Atrial fibrillation with RVR ([place])    Converted to NSR with lopressor   Benign prostatic hypertrophy with elevated PSA    Dr Alinda Money, High grade PIN w/o malignancy   History of colonic polyps 2003, 2007   hyperplastic   Hx of colonoscopy 2012   neg   Hyperlipidemia    Hypertension    Retinal vascular occlusion    Dr Delman Cheadle   Past Surgical History:  Procedure Laterality Date   APPENDECTOMY     COLONOSCOPY W/ POLYPECTOMY      X2 , Dr Deatra Ina   ENDOVENOUS ABLATION SAPHENOUS VEIN W/ LASER Left 09/29/2017   endovenous laser ablation left greater saphenous vein by Tinnie Gens MD    HERNIA REPAIR  [id number]    Dr Margot Chimes   lichenoid keratosis resected  8/14   LIPOMA EXCISION     X 5   PROSTATE BIOPSY  09/2010   Dr Alinda Money   stab phlebectomy  Left 01/27/2018   stab phlebectomy 10-20 incisions left leg by Ruta Hinds MD   Patient Active Problem List   Diagnosis Date Noted   Right hip pain 04/01/2022   Poor posture 02/06/2022   Mid back pain 02/06/2022   Right foot pain 02/06/2022   Secondary hypercoagulable state ([place]) 03/28/2021   Ingrown toenail of right foot 03/23/2019   Current use of long term anticoagulation 01/20/2018   EtOH dependence ([place]) 07/30/2017   Paroxysmal atrial fibrillation ([place]) 07/29/2017   Varicose veins of left lower extremity with complications 47/82/9562   Greater trochanteric bursitis of right hip  01/02/2017   Prediabetes 09/20/2016   Rotator cuff syndrome of right shoulder 09/27/2015   Vitamin D deficiency 13/12/6576   Lichenoid keratosis 46/96/2952   Superficial thrombosis of leg 03/28/2013   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   Inguinal hernia recurrent unilateral 08/10/2009   History of colonic polyps 05/15/2008   Essential hypertension 05/15/2008   HYPERLIPIDEMIA 05/12/2007   Elevated prostate specific antigen (PSA) 05/12/2007    PCP: Binnie Rail, MD  REFERRING PROVIDER: Binnie Rail, MD  REFERRING DIAG: [phone] (ICD-10-CM) - Right hip pain  THERAPY DIAG:  Pain in right hip  Other abnormalities of gait and mobility  Muscle weakness (generalized)  Unsteadiness on feet  RATIONALE FOR EVALUATION AND TREATMENT: Rehabilitation  ONSET DATE: 2-3 years on/off  NEXT MD VISIT: Unknown   SUBJECTIVE:   SUBJECTIVE STATEMENT: Pt reports more pain in his R upper shoulder today than in his hip (hip seems to be getting better) - not sure what triggered the shoulder pain.   PAIN:  Are you having pain? Yes: NPRS scale: 2/10 Pain location: side of the hip R LE Pain description: shooting, short, jolt Aggravating factors: walking  Relieving factors: not aware of anything  Are you having pain? Yes: NPRS scale: 3/10 Pain  location: R posterior upper shoulder  Pain description: sore Aggravating factors: uncertain - may have done something during his stretches Relieving factors: unknown  PERTINENT HISTORY: Afib, HTN, Greater Trochanteric Bursitis R hip, Prediabetes  PRECAUTIONS: None  WEIGHT BEARING RESTRICTIONS: No  FALLS:  Has patient fallen in last 6 months? Yes. Number of falls 1  LIVING ENVIRONMENT: Lives with: lives with their family Lives in: House/apartment Stairs: Yes: Internal: 17 steps; on left going up and External: 4 steps; none Has following equipment at home: None  OCCUPATION: retired  PLOF: Independent and Leisure: walking, golf, gym  PATIENT  GOALS: "remove the constant pain and decrease jolting pain"   OBJECTIVE:   DIAGNOSTIC FINDINGS:  02/11/22 XR Hip Unilat w or w/o pelvis Findings:  An AP pelvis and lateral right hip shows mild arthritic findings which is slight joint space narrowing and a small osteophyte.   02/11/22 XR Knee  Findings: 2 views of the right knee show well-maintained medial lateral compartments.  There is narrowing of the patellofemoral joint.  There are changes in the metaphyseal area of the femur that are chronic and appear to be consistent with an enchondroma.  This is an incidental finding.   PATIENT SURVEYS:  LEFS 56 / 80 = 70.0 %  COGNITION: Overall cognitive status: Within functional limits for tasks assessed    SENSATION: WFL  MUSCLE LENGTH: Hamstrings: mod tightness ITB: min tightness Piriformis: mod tightness Hip flexors: mod tightness Quads: mod tightness Heelcord: NT  POSTURE:  No Significant postural limitations  PALPATION: Increased tenderness of R Greater trochanter  LUMBAR ROM:   Active  A/PROM  eval  Flexion 50% limited  Extension 25%  limited  Right lateral flexion 50% limited  Left lateral flexion 50% limted  Right rotation WFL   Left rotation WFL   (Blank rows = not tested)  LOWER EXTREMITY ROM:  Grossly WFL  LOWER EXTREMITY MMT:  MMT Right eval Left eval  Hip flexion 4 4  Hip extension 4 4  Hip abduction 4 4  Hip adduction 4- 4-  Hip internal rotation 4+ 4+  Hip external rotation 4+ 4+  Knee flexion 5 5  Knee extension 5 5  Ankle dorsiflexion 5 5  Ankle plantarflexion    Ankle inversion    Ankle eversion     (Blank rows = not tested)  LOWER EXTREMITY SPECIAL TESTS:  Hip special tests: Saralyn Pilar (FABER) test: positive , Trendelenburg test: negative, and Hip scouring test: negative  FUNCTIONAL TESTS: *test at next visit Functional gait assessment: NT  GAIT: Distance walked: 90 ft Assistive device utilized: None Level of assistance: Complete  Independence Comments: slight L side hip drop/ R Trendelenburg   TODAY'S TREATMENT:                                                                                                                              DATE:   05/06/22 THERAPEUTIC EXERCISE: to improve flexibility, strength and mobility.  Verbal and tactile cues throughout for technique. NuStep - L5 x 7 min Standing RTB 4-way SLR x 10 each direction bil; 2 pole A for balance Standing R lateral lean ITB stretch at wall 2 x 30" Supine R crossbody ITB stretch with strap 2 x 30" Sidelying GTB clam 10 x 3" bil Sit <> stand + GTB hip ABD isometric x 10 L/R SLS + opp hip ABD/ER isometric into ball on wall 10 x 5"   04/30/22 THERAPEUTIC EXERCISE: to improve flexibility, strength and mobility.  Verbal and tactile cues throughout for technique. NuStep L5 x 6 min Seated HS Stretch R/L 1x30 Seated Piriformis Stretch R/L 1x30 Seated forward, lateral flexion with Red Swiss ball 1x10 Supine Isometric Hip Abduction Single Leg R/L GTB- cues to hold for 5-10 seconds Glute bridges GTB 1x10 with 5 sec hold- cues to keep tension on the band throughout Supine BLE alt marches with GTB R/L 2x10  THERAPEUTIC ACTIVITIES: FGA: 26/30   04/28/22  Initial eval Seated Piriformis Stretch, bringing knee to chest R/L 1x30 sec Supine Single Leg Hip Abduction with RTB R/L 1x10   PATIENT EDUCATION:  Education details: HEP update - ITB stretches Person educated: Patient Education method: Explanation, Demonstration, and Handouts Education comprehension: verbalized understanding and returned demonstration  HOME EXERCISE PROGRAM: Access Code: [email or web address] URL: [email or web address] Date: 05/06/2022 Prepared by: Sterling Hamstring Stretch with Strap  - 2 x daily - 7 x weekly - 3 reps - 30 sec hold - Supine Quadriceps Stretch with Strap on Table  - 2 x daily - 7 x weekly - 3 reps - 30 sec hold - Seated Piriformis  Stretch  - 2 x daily - 7 x weekly - 3 sets - 30 sec hold - Hooklying Isometric Clamshell  - 1 x daily - 5 x weekly - 3 sets - 10 reps - Supine Bridge with Resistance Band  - 1 x daily - 5 x weekly - 3 sets - 10 reps - 5 hold - Supine March with Resistance Band  - 1 x daily - 5 x weekly - 3 sets - 10 reps - 5 hold - Supine Iliotibial Band Stretch with Strap  - 2-3 x daily - 7 x weekly - 3 reps - 30 sec hold - Standing ITB Stretch  - 2-3 x daily - 7 x weekly - 3 reps - 30 sec hold  ASSESSMENT:  CLINICAL IMPRESSION: Connar feels that his R hip is improving but notes some new pain in his R upper shoulder today from unknown MOI. He reports the HEP is going well and denies need for review today. Added stretching for ITB to reduce muscle tension/pressure over lateral hip and greater trochanter. Progressed proximal LE strengthening with mix of isometric and dynamic resistance while incorporating SLS for improved balance and stability. All exercises well tolerated but deferred progression of HEP strengthening as pt reports current strengthening exercises remain challenging. No point tenderness perceived over R greater trochanter today, but may consider ionto patch if this is noted again in the future.  OBJECTIVE IMPAIRMENTS: Abnormal gait, decreased activity tolerance, decreased balance, decreased coordination, decreased endurance, decreased mobility, difficulty walking, decreased ROM, decreased strength, hypomobility, increased fascial restrictions, impaired perceived functional ability, impaired flexibility, improper body mechanics, postural dysfunction, and pain.   ACTIVITY LIMITATIONS: bending, sitting, standing, squatting, stairs, and locomotion level  PARTICIPATION LIMITATIONS: meal prep, cleaning, laundry, shopping, community activity, yard work, and walking, gym, golfing  PERSONAL FACTORS: Time since onset of injury/illness/exacerbation  and 1-2 comorbidities: Afib/HTN, Greater Trochanteric Bursitis R  hip, Prediabetes  are also affecting patient's functional outcome.   REHAB POTENTIAL: Good  CLINICAL DECISION MAKING: Stable/uncomplicated  EVALUATION COMPLEXITY: Low   GOALS: Goals reviewed with patient? Yes  SHORT TERM GOALS: Target date: 05/26/22 Pt will be independent with initial HEP.  Baseline: Goal status: IN PROGRESS  LONG TERM GOALS: Target date: 06/23/22  Pt will be independent with advanced/ongoing HEP to improve outcomes/carryover.  Baseline:  Goal status: IN PROGRESS  2.  Pt will report at least 75% improvement in R hip pain to improve QOL.  Baseline:  Goal status: IN PROGRESS  3.  Pt will score at least 67 on LEFS to demonstrate improved functional ability. Baseline: 56 / 80 = 70.0 % Goal status: IN PROGRESS  4.  Pt will score >/= 19/30 on FGA to decrease risk of falls.  Baseline:  Goal status: IN PROGRESS  5.  Pt will amb >10 minutes with decreased R Trendelenburg without increased pain to access community or play golf. Baseline:  Goal status: IN PROGRESS  6.  Pt will demonstrate reduced muscle tightness of proximal hip musculature on BLE to </=mild tightness to improve mobility and ease of functional tasks.  Baseline:  Goal status: IN PROGRESS  7.  Pt will report >/= 2/4 when donning shoes/socks, squatting, and getting into and out of a car on LEFS to demonstrate decreased pain and improved functional ability.  Baseline:  Goal status: IN PROGRESS   PLAN:  PT FREQUENCY: 2x/week  PT DURATION: 8 weeks  PLANNED INTERVENTIONS: Therapeutic exercises, Therapeutic activity, Neuromuscular re-education, Balance training, Gait training, Patient/Family education, Self Care, Joint mobilization, Stair training, Aquatic Therapy, Dry Needling, Electrical stimulation, Cryotherapy, Moist heat, Ultrasound, Ionotophoresis '4mg'$ /ml Dexamethasone, Manual therapy, and Re-evaluation  PLAN FOR NEXT SESSION: Stretching of proximal hip muscles, progressive  strengthening/loading of R hip abductors/ERs, balance, gait   Percival Spanish, PT 05/06/2022, 11:12 AM

## 2022-05-07 DIAGNOSIS — R972 Elevated prostate specific antigen [PSA]: Secondary | ICD-10-CM | POA: Diagnosis not present

## 2022-05-07 DIAGNOSIS — N401 Enlarged prostate with lower urinary tract symptoms: Secondary | ICD-10-CM | POA: Diagnosis not present

## 2022-05-07 DIAGNOSIS — R3915 Urgency of urination: Secondary | ICD-10-CM | POA: Diagnosis not present

## 2022-05-08 ENCOUNTER — Ambulatory Visit: Payer: PPO | Attending: Internal Medicine

## 2022-05-08 DIAGNOSIS — R2681 Unsteadiness on feet: Secondary | ICD-10-CM | POA: Diagnosis not present

## 2022-05-08 DIAGNOSIS — R2689 Other abnormalities of gait and mobility: Secondary | ICD-10-CM | POA: Insufficient documentation

## 2022-05-08 DIAGNOSIS — M25551 Pain in right hip: Secondary | ICD-10-CM | POA: Insufficient documentation

## 2022-05-08 DIAGNOSIS — M6281 Muscle weakness (generalized): Secondary | ICD-10-CM | POA: Insufficient documentation

## 2022-05-08 NOTE — Therapy (Addendum)
OUTPATIENT PHYSICAL THERAPY LOWER EXTREMITY TREATMENT / DISCHARGE SUMMARY    Patient Name: [name] [name] MRN: [id number] DOB:1950/09/16, 71 y.o., male Today's Date: 05/08/2022  END OF SESSION:  PT End of Session - 05/08/22 1406     Visit Number 4    Date for PT Re-Evaluation 06/23/22    Authorization Type [employer]    PT Start Time 1316    PT Stop Time 1359    PT Time Calculation (min) 43 min    Activity Tolerance Patient tolerated treatment well    Behavior During Therapy WFL for tasks assessed/performed              Past Medical History:  Diagnosis Date   Atrial fibrillation with RVR ([facility])    Converted to NSR with lopressor   Benign prostatic hypertrophy with elevated PSA    Dr Laverle Patter, High grade PIN w/o malignancy   History of colonic polyps 2003, 2007   hyperplastic   Hx of colonoscopy 2012   neg   Hyperlipidemia    Hypertension    Retinal vascular occlusion    Dr Emily Filbert   Past Surgical History:  Procedure Laterality Date   APPENDECTOMY     COLONOSCOPY W/ POLYPECTOMY      X2 , Dr Arlyce Dice   ENDOVENOUS ABLATION SAPHENOUS VEIN W/ LASER Left 09/29/2017   endovenous laser ablation left greater saphenous vein by Josephina Gip MD    HERNIA REPAIR  [phone]    Dr Jamey Ripa   lichenoid keratosis resected  8/14   LIPOMA EXCISION     X 5   PROSTATE BIOPSY  09/2010   Dr Laverle Patter   stab phlebectomy  Left 01/27/2018   stab phlebectomy 10-20 incisions left leg by Fabienne Bruns MD   Patient Active Problem List   Diagnosis Date Noted   Right hip pain 04/01/2022   Poor posture 02/06/2022   Mid back pain 02/06/2022   Right foot pain 02/06/2022   Secondary hypercoagulable state ([facility]) 03/28/2021   Ingrown toenail of right foot 03/23/2019   Current use of long term anticoagulation 01/20/2018   EtOH dependence ([facility]) 07/30/2017   Paroxysmal atrial fibrillation ([facility]) 07/29/2017   Varicose veins of left lower extremity with complications 03/24/2017   Greater  trochanteric bursitis of right hip 01/02/2017   Prediabetes 09/20/2016   Rotator cuff syndrome of right shoulder 09/27/2015   Vitamin D deficiency 09/07/2014   Lichenoid keratosis 09/01/2013   Superficial thrombosis of leg 03/28/2013   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   Inguinal hernia recurrent unilateral 08/10/2009   History of colonic polyps 05/15/2008   Essential hypertension 05/15/2008   HYPERLIPIDEMIA 05/12/2007   Elevated prostate specific antigen (PSA) 05/12/2007    PCP: Pincus Sanes, MD  REFERRING PROVIDER: Pincus Sanes, MD  REFERRING DIAG: [phone] (ICD-10-CM) - Right hip pain  THERAPY DIAG:  Pain in right hip  Other abnormalities of gait and mobility  Muscle weakness (generalized)  Unsteadiness on feet  RATIONALE FOR EVALUATION AND TREATMENT: Rehabilitation  ONSET DATE: 2-3 years on/off  NEXT MD VISIT: Unknown   SUBJECTIVE:   SUBJECTIVE STATEMENT: Pt notes an electric shock type of pain when stepping occasionally but not much pain.  PAIN:  Are you having pain? Yes: NPRS scale: 2/10 Pain location: side of the hip R LE Pain description: shooting, short, jolt Aggravating factors: walking  Relieving factors: not aware of anything  Are you having pain? Yes: NPRS scale: 3/10 Pain location: R posterior upper shoulder  Pain description: sore  Aggravating factors: uncertain - may have done something during his stretches Relieving factors: unknown  PERTINENT HISTORY: Afib, HTN, Greater Trochanteric Bursitis R hip, Prediabetes  PRECAUTIONS: None  WEIGHT BEARING RESTRICTIONS: No  FALLS:  Has patient fallen in last 6 months? Yes. Number of falls 1  LIVING ENVIRONMENT: Lives with: lives with their family Lives in: House/apartment Stairs: Yes: Internal: 17 steps; on left going up and External: 4 steps; none Has following equipment at home: None  OCCUPATION: retired  PLOF: Independent and Leisure: walking, golf, gym  PATIENT GOALS: "remove the  constant pain and decrease jolting pain"   OBJECTIVE:   DIAGNOSTIC FINDINGS:  02/11/22 XR Hip Unilat w or w/o pelvis Findings:  An AP pelvis and lateral right hip shows mild arthritic findings which is slight joint space narrowing and a small osteophyte.   02/11/22 XR Knee  Findings: 2 views of the right knee show well-maintained medial lateral compartments.  There is narrowing of the patellofemoral joint.  There are changes in the metaphyseal area of the femur that are chronic and appear to be consistent with an enchondroma.  This is an incidental finding.   PATIENT SURVEYS:  LEFS 56 / 80 = 70.0 %  COGNITION: Overall cognitive status: Within functional limits for tasks assessed    SENSATION: WFL  MUSCLE LENGTH: Hamstrings: mod tightness ITB: min tightness Piriformis: mod tightness Hip flexors: mod tightness Quads: mod tightness Heelcord: NT  POSTURE:  No Significant postural limitations  PALPATION: Increased tenderness of R Greater trochanter  LUMBAR ROM:   Active  A/PROM  eval  Flexion 50% limited  Extension 25%  limited  Right lateral flexion 50% limited  Left lateral flexion 50% limted  Right rotation WFL   Left rotation WFL   (Blank rows = not tested)  LOWER EXTREMITY ROM:  Grossly WFL  LOWER EXTREMITY MMT:  MMT Right eval Left eval  Hip flexion 4 4  Hip extension 4 4  Hip abduction 4 4  Hip adduction 4- 4-  Hip internal rotation 4+ 4+  Hip external rotation 4+ 4+  Knee flexion 5 5  Knee extension 5 5  Ankle dorsiflexion 5 5  Ankle plantarflexion    Ankle inversion    Ankle eversion     (Blank rows = not tested)  LOWER EXTREMITY SPECIAL TESTS:  Hip special tests: Luisa Hart (FABER) test: positive , Trendelenburg test: negative, and Hip scouring test: negative  FUNCTIONAL TESTS: *test at next visit Functional gait assessment: NT  GAIT: Distance walked: 90 ft Assistive device utilized: None Level of assistance: Complete  Independence Comments: slight L side hip drop/ R Trendelenburg   TODAY'S TREATMENT:                                                                                                                              DATE:   05/08/22 THERAPEUTIC EXERCISE: to improve flexibility, strength and mobility.  Verbal and tactile cues throughout for technique. NuStep -  L5 x 6 min Seated piriformis stretch KTOS x 30 sec R/L Standing hip flexor stretch on step x 30 sec R/L Supine ER/IR 10x5" bil Supine R hip IR AROM 10x5" S/L GTB clamshells 10x3" bil - cues to avoid rolling back Supine bridge GTB x 10  Lateral step downs 6' step with 1 ski pole x 10 RLE  05/06/22 THERAPEUTIC EXERCISE: to improve flexibility, strength and mobility.  Verbal and tactile cues throughout for technique. NuStep - L5 x 7 min Standing RTB 4-way SLR x 10 each direction bil; 2 pole A for balance Standing R lateral lean ITB stretch at wall 2 x 30" Supine R crossbody ITB stretch with strap 2 x 30" Sidelying GTB clam 10 x 3" bil Sit <> stand + GTB hip ABD isometric x 10 L/R SLS + opp hip ABD/ER isometric into ball on wall 10 x 5"   04/30/22 THERAPEUTIC EXERCISE: to improve flexibility, strength and mobility.  Verbal and tactile cues throughout for technique. NuStep L5 x 6 min Seated HS Stretch R/L 1x30 Seated Piriformis Stretch R/L 1x30 Seated forward, lateral flexion with Red Swiss ball 1x10 Supine Isometric Hip Abduction Single Leg R/L GTB- cues to hold for 5-10 seconds Glute bridges GTB 1x10 with 5 sec hold- cues to keep tension on the band throughout Supine BLE alt marches with GTB R/L 2x10  THERAPEUTIC ACTIVITIES: FGA: 26/30   04/28/22  Initial eval Seated Piriformis Stretch, bringing knee to chest R/L 1x30 sec Supine Single Leg Hip Abduction with RTB R/L 1x10   PATIENT EDUCATION:  Education details: HEP update - ITB stretches Person educated: Patient Education method: Explanation, Demonstration, and  Handouts Education comprehension: verbalized understanding and returned demonstration  HOME EXERCISE PROGRAM: Access Code: [email or web address] URL: [email or web address] Date: 05/06/2022 Prepared by: Glenetta Hew  Exercises - Hooklying Hamstring Stretch with Strap  - 2 x daily - 7 x weekly - 3 reps - 30 sec hold - Supine Quadriceps Stretch with Strap on Table  - 2 x daily - 7 x weekly - 3 reps - 30 sec hold - Seated Piriformis Stretch  - 2 x daily - 7 x weekly - 3 sets - 30 sec hold - Hooklying Isometric Clamshell  - 1 x daily - 5 x weekly - 3 sets - 10 reps - Supine Bridge with Resistance Band  - 1 x daily - 5 x weekly - 3 sets - 10 reps - 5 hold - Supine March with Resistance Band  - 1 x daily - 5 x weekly - 3 sets - 10 reps - 5 hold - Supine Iliotibial Band Stretch with Strap  - 2-3 x daily - 7 x weekly - 3 reps - 30 sec hold - Standing ITB Stretch  - 2-3 x daily - 7 x weekly - 3 reps - 30 sec hold  ASSESSMENT:  CLINICAL IMPRESSION: Continued with progression of exercises to improve R hip strength and mobility. Pt noted a good stretch with with supine ER/IR exercises and added to HEP. Good demo of exercises, only needing cues to clamshells to avoid rolling back over to target glute med.  OBJECTIVE IMPAIRMENTS: Abnormal gait, decreased activity tolerance, decreased balance, decreased coordination, decreased endurance, decreased mobility, difficulty walking, decreased ROM, decreased strength, hypomobility, increased fascial restrictions, impaired perceived functional ability, impaired flexibility, improper body mechanics, postural dysfunction, and pain.   ACTIVITY LIMITATIONS: bending, sitting, standing, squatting, stairs, and locomotion level  PARTICIPATION LIMITATIONS: meal prep, cleaning, laundry, shopping, community activity, yard work, and walking, gym, golfing  PERSONAL FACTORS: Time since onset of injury/illness/exacerbation and 1-2 comorbidities: Afib/HTN, Greater  Trochanteric Bursitis R hip, Prediabetes  are also affecting patient's functional outcome.   REHAB POTENTIAL: Good  CLINICAL DECISION MAKING: Stable/uncomplicated  EVALUATION COMPLEXITY: Low   GOALS: Goals reviewed with patient? Yes  SHORT TERM GOALS: Target date: 05/26/22 Pt will be independent with initial HEP.  Baseline: Goal status: IN PROGRESS  LONG TERM GOALS: Target date: 06/23/22  Pt will be independent with advanced/ongoing HEP to improve outcomes/carryover.  Baseline:  Goal status: IN PROGRESS  2.  Pt will report at least 75% improvement in R hip pain to improve QOL.  Baseline:  Goal status: IN PROGRESS  3.  Pt will score at least 67 on LEFS to demonstrate improved functional ability. Baseline: 56 / 80 = 70.0 % Goal status: IN PROGRESS  4.  Pt will score >/= 19/30 on FGA to decrease risk of falls.  Baseline:  Goal status: IN PROGRESS  5.  Pt will amb >10 minutes with decreased R Trendelenburg without increased pain to access community or play golf. Baseline:  Goal status: IN PROGRESS  6.  Pt will demonstrate reduced muscle tightness of proximal hip musculature on BLE to </=mild tightness to improve mobility and ease of functional tasks.  Baseline:  Goal status: IN PROGRESS  7.  Pt will report >/= 2/4 when donning shoes/socks, squatting, and getting into and out of a car on LEFS to demonstrate decreased pain and improved functional ability.  Baseline:  Goal status: IN PROGRESS   PLAN:  PT FREQUENCY: 2x/week  PT DURATION: 8 weeks  PLANNED INTERVENTIONS: Therapeutic exercises, Therapeutic activity, Neuromuscular re-education, Balance training, Gait training, Patient/Family education, Self Care, Joint mobilization, Stair training, Aquatic Therapy, Dry Needling, Electrical stimulation, Cryotherapy, Moist heat, Ultrasound, Ionotophoresis /ml Dexamethasone, Manual therapy, and Re-evaluation  PLAN FOR NEXT SESSION: Stretching of proximal hip muscles,  progressive strengthening/loading of R hip abductors/ERs, balance, gait   Antoinett Dorman L Solina Heron, PTA 05/08/2022, 2:06 PM    PHYSICAL THERAPY DISCHARGE SUMMARY  Visits from Start of Care: 4  Current functional level related to goals / functional outcomes:   Refer to above clinical impression and goal assessment for status as of last visit on 05/08/22. Patient cancelled all scheduled appts in January 2024 due to traveling and has not returned to PT in >30 days, therefore will proceed with discharge from PT for this episode.     Remaining deficits:   As above. Unable to formally assess status at discharge due to failure to return to PT.   Education / Equipment:   HEP   Patient agrees to discharge. Patient goals were not met. Patient is being discharged due to not returning since the last visit.   Marry Guan, PT, MPT 09/11/22, 4:31 PM  [facility] [address]  [address] [place], [place], [address] Phone: [phone]   Fax:  [phone]

## 2022-05-22 DIAGNOSIS — H52203 Unspecified astigmatism, bilateral: Secondary | ICD-10-CM | POA: Diagnosis not present

## 2022-05-22 DIAGNOSIS — H31002 Unspecified chorioretinal scars, left eye: Secondary | ICD-10-CM | POA: Diagnosis not present

## 2022-05-23 ENCOUNTER — Encounter: Payer: Self-pay | Admitting: Internal Medicine

## 2022-05-27 ENCOUNTER — Encounter: Payer: PPO | Admitting: Physical Therapy

## 2022-06-03 ENCOUNTER — Ambulatory Visit: Payer: PPO | Admitting: Physical Therapy

## 2022-06-10 ENCOUNTER — Encounter: Payer: PPO | Admitting: Physical Therapy

## 2022-07-07 ENCOUNTER — Ambulatory Visit (INDEPENDENT_AMBULATORY_CARE_PROVIDER_SITE_OTHER): Payer: PPO | Admitting: Physician Assistant

## 2022-07-07 ENCOUNTER — Encounter: Payer: Self-pay | Admitting: Physician Assistant

## 2022-07-07 VITALS — Ht 75.0 in | Wt 225.0 lb

## 2022-07-07 DIAGNOSIS — M25561 Pain in right knee: Secondary | ICD-10-CM | POA: Diagnosis not present

## 2022-07-07 MED ORDER — METHYLPREDNISOLONE ACETATE 40 MG/ML IJ SUSP
40.0000 mg | INTRAMUSCULAR | Status: AC | PRN
  Administered 2022-07-07: 40 mg via INTRA_ARTICULAR

## 2022-07-07 MED ORDER — LIDOCAINE HCL 1 % IJ SOLN
4.0000 mL | INTRAMUSCULAR | Status: AC | PRN
  Administered 2022-07-07: 4 mL

## 2022-07-07 NOTE — Progress Notes (Signed)
Office Visit Note   Patient: [name] [name]           Date of Birth: 1950-05-27           MRN: [id number] Visit Date: 07/07/2022              Requested by: [name] Rail, MD [name] [name],  [name] [name] [id number] PCP: [name] Rail, MD   Assessment & Plan: Visit Diagnoses:  1. Acute pain of right knee     Plan:  Given the acute nature of the knee pain and the acute mechanical block of the knee recommend MRI to rule out meniscal tear as the source.  Having follow-up after the MRI to go over results and discuss further treatment.  Questions were encouraged and answered at length  Follow-Up Instructions: Return for After MRI.   Orders:  Orders Placed This Encounter  Procedures   Large Joint Inj   No orders of the defined types were placed in this encounter.     Procedures: Large Joint Inj on 07/07/2022 2:46 PM Indications: pain Details: 22 G 1.5 in needle, superolateral approach  Arthrogram: No  Medications: 4 mL lidocaine 1 %; 40 mg methylPREDNISolone acetate 40 MG/ML Aspirate: 2 mL yellow and blood-tinged Outcome: tolerated well, no immediate complications Procedure, treatment alternatives, risks and benefits explained, specific risks discussed. Consent was given by the patient. Immediately prior to procedure a time out was called to verify the correct patient, procedure, equipment, support staff and site/side marked as required. Patient was prepped and draped in the usual sterile fashion.       Clinical Data: No additional findings.   Subjective: Chief Complaint  Patient presents with   Right Knee - Pain   Right Leg - Pain    HPI [name] [name] 72 year old male known to [name] [name] service comes in today with right knee pain that started 1 week ago.  He states that he was walking into the living room and had a sharp pain in the knee.  Did not twist it.  However prior to that 4 days he had been moving boxes and going up and down stairs.  Notes some  swelling in the knee and decreased range of motion.  He is also having some radicular symptoms down his right leg from the buttocks to the foot.  But he states the knee pain is what bothers him the most.  Denies any back pain.  He notes that the first day after the sharp pain in his knee was unable to ambulate without a cane.  He is no longer using a cane.  He has been using ibuprofen with some relief. Prior imaging of his right knee on 02/11/2022 showed slight narrowing the patellofemoral joint.  Medial lateral compartments were well-preserved.  Endochondroma in the distal metaphysis of the femur.  No acute fractures at that time.  Review of Systems  Constitutional:  Negative for chills and fever.     Objective: Vital Signs: Ht 6' 3"$  (1.905 m)   Wt 225 lb (102.1 kg)   BMI 28.12 kg/m   Physical Exam Constitutional:      Appearance: He is not ill-appearing or diaphoretic.  Pulmonary:     Effort: Pulmonary effort is normal.  Neurological:     Mental Status: He is alert and oriented to person, place, and time.     Ortho Exam Bilateral knees left knee full range of motion without pain.  Right knee he lacks full extension  by approximately 10 to 15 degrees both actively and passively.  He has full flexion.  Tenderness posterior aspect of the knee.  McMurray's is positive on the right negative on the left.  Slight effusion right knee no abnormal warmth erythema of either knee.  Specialty Comments:  No specialty comments available.  Imaging: No results found.   PMFS History: Patient Active Problem List   Diagnosis Date Noted   Right hip pain 04/01/2022   Poor posture 02/06/2022   Mid back pain 02/06/2022   Right foot pain 02/06/2022   Secondary hypercoagulable state ([place]) 03/28/2021   Ingrown toenail of right foot 03/23/2019   Current use of long term anticoagulation 01/20/2018   EtOH dependence ([place]) 07/30/2017   Paroxysmal atrial fibrillation ([place]) 07/29/2017   Varicose veins  of left lower extremity with complications [id number]   Greater trochanteric bursitis of right hip 01/02/2017   Prediabetes 09/20/2016   Rotator cuff syndrome of right shoulder 09/27/2015   Vitamin D deficiency 0000000   Lichenoid keratosis Q000111Q   Superficial thrombosis of leg 03/28/2013   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   Inguinal hernia recurrent unilateral 08/10/2009   History of colonic polyps 05/15/2008   Essential hypertension 05/15/2008   HYPERLIPIDEMIA 05/12/2007   Elevated prostate specific antigen (PSA) 05/12/2007   Past Medical History:  Diagnosis Date   Atrial fibrillation with RVR ([place])    Converted to NSR with lopressor   Benign prostatic hypertrophy with elevated PSA    [name] [name], High grade PIN w/o malignancy   History of colonic polyps 2003, 2007   hyperplastic   Hx of colonoscopy 2012   neg   Hyperlipidemia    Hypertension    Retinal vascular occlusion    [name] [name]    Family History  Problem Relation Age of Onset   Alzheimer's disease Mother    Heart attack Father 57   Hypertension Brother    Hypertension Maternal Grandmother    Diabetes Neg Hx    Stroke Neg Hx    Colon cancer Neg Hx    Esophageal cancer Neg Hx    Pancreatic cancer Neg Hx    Rectal cancer Neg Hx    Stomach cancer Neg Hx     Past Surgical History:  Procedure Laterality Date   APPENDECTOMY     COLONOSCOPY W/ POLYPECTOMY      X2 , [name] [name]   ENDOVENOUS ABLATION SAPHENOUS VEIN W/ LASER Left 09/29/2017   endovenous laser ablation left greater saphenous vein by [name] Gens MD    HERNIA REPAIR  [id number]    [name] [name]   lichenoid keratosis resected  8/14   LIPOMA EXCISION     X 5   PROSTATE BIOPSY  09/2010   [name] [name]   stab phlebectomy  Left 01/27/2018   stab phlebectomy 10-20 incisions left leg by [name] Hinds MD   Social History   Occupational History   Not on file  Tobacco Use   Smoking status: Never   Smokeless tobacco: Never  Vaping Use   Vaping Use: Never  used  Substance and Sexual Activity   Alcohol use: Yes    Alcohol/week: 3.0 standard drinks of alcohol    Types: 3 Glasses of wine Landin week    Comment: daily   Drug use: No   Sexual activity: Not on file

## 2022-07-08 NOTE — Addendum Note (Signed)
Addended by: Robyne Peers on: 07/08/2022 09:14 AM   Modules accepted: Orders

## 2022-07-12 ENCOUNTER — Ambulatory Visit
Admission: RE | Admit: 2022-07-12 | Discharge: 2022-07-12 | Disposition: A | Discharge status: Home / Self Care | Payer: PPO | Source: Ambulatory Visit | Attending: Physician Assistant | Admitting: Physician Assistant

## 2022-07-12 DIAGNOSIS — M7121 Synovial cyst of popliteal space [Baker], right knee: Secondary | ICD-10-CM | POA: Diagnosis not present

## 2022-07-12 DIAGNOSIS — M25561 Pain in right knee: Secondary | ICD-10-CM

## 2022-07-12 DIAGNOSIS — M948X6 Other specified disorders of cartilage, lower leg: Secondary | ICD-10-CM | POA: Diagnosis not present

## 2022-07-12 DIAGNOSIS — M1711 Unilateral primary osteoarthritis, right knee: Secondary | ICD-10-CM | POA: Diagnosis not present

## 2022-07-12 DIAGNOSIS — M23221 Derangement of posterior horn of medial meniscus due to old tear or injury, right knee: Secondary | ICD-10-CM | POA: Diagnosis not present

## 2022-07-21 ENCOUNTER — Ambulatory Visit (INDEPENDENT_AMBULATORY_CARE_PROVIDER_SITE_OTHER): Payer: PPO | Admitting: Physician Assistant

## 2022-07-21 ENCOUNTER — Encounter: Payer: Self-pay | Admitting: Physician Assistant

## 2022-07-21 DIAGNOSIS — M1711 Unilateral primary osteoarthritis, right knee: Secondary | ICD-10-CM | POA: Diagnosis not present

## 2022-07-21 DIAGNOSIS — M7061 Trochanteric bursitis, right hip: Secondary | ICD-10-CM

## 2022-07-21 MED ORDER — METHYLPREDNISOLONE ACETATE 40 MG/ML IJ SUSP
40.0000 mg | INTRAMUSCULAR | Status: AC | PRN
  Administered 2022-07-21: 40 mg via INTRA_ARTICULAR

## 2022-07-21 MED ORDER — LIDOCAINE HCL 1 % IJ SOLN
3.0000 mL | INTRAMUSCULAR | Status: AC | PRN
  Administered 2022-07-21: 3 mL

## 2022-07-21 NOTE — Progress Notes (Signed)
   Procedure Note  Patient: [name] [name]             Date of Birth: 09-21-1950           MRN: [id number]             Visit Date: 07/21/2022 HPI: Mr. Arbon comes in today to review the MRI of his left knee.  States overall the knee is better since undergoing the injection.  He still having pain right hip hamstring region. MRI right knee dated 07/15/2022 showed full-thickness cartilage loss lateral facet of the patella with subchondral reactive marrow edema.  High-grade partial-thickness cartilage loss and areas of full-thickness cartilage loss in the medial patellar facet.  Trochlea with partial thickness cartilage loss.  No meniscal tear.  Medial lateral cartilage well-preserved.  Slight edema in the anterior superior patellar fat pad.  Review of systems: See HPI otherwise negative  Physical exam: General well-developed well-nourished male ambulates about the room without any assistive device. Right hip good range of motion.  Tenderness over the trochanteric region.  Procedures: Visit Diagnoses:  1. Trochanteric bursitis, right hip   2. Patellofemoral arthritis of right knee     Large Joint Inj: R greater trochanter on 07/21/2022 4:45 PM Indications: pain Details: 22 G 1.5 in needle, lateral approach  Arthrogram: No  Medications: 3 mL lidocaine 1 %; 40 mg methylPREDNISolone acetate 40 MG/ML Outcome: tolerated well, no immediate complications Procedure, treatment alternatives, risks and benefits explained, specific risks discussed. Consent was given by the patient. Immediately prior to procedure a time out was called to verify the correct patient, procedure, equipment, support staff and site/side marked as required. Patient was prepped and draped in the usual sterile fashion.      Plan: In regards to his knee discussed treatments this will include physical therapy, supplemental injection he is unable to take NSAIDs.  In regards to his hip he shown IT band stretching exercises.  At  this point time he close like to watch the knee and see how it does.  Quad strengthening exercises for the knee were shown and knee friendly exercises were discussed.  Follow-up as needed. n

## 2022-07-24 ENCOUNTER — Encounter: Payer: Self-pay | Admitting: Radiology

## 2022-08-07 ENCOUNTER — Other Ambulatory Visit: Payer: Self-pay | Admitting: Cardiovascular Disease

## 2022-08-11 ENCOUNTER — Encounter: Payer: Self-pay | Admitting: Cardiovascular Disease

## 2022-08-14 ENCOUNTER — Other Ambulatory Visit: Payer: Self-pay | Admitting: Cardiovascular Disease

## 2022-08-14 DIAGNOSIS — I480 Paroxysmal atrial fibrillation: Secondary | ICD-10-CM

## 2022-08-14 NOTE — Telephone Encounter (Signed)
Prescription refill request for Eliquis received. Indication: Afib  Last office visit: 08/09/21 ([facility]) Scr: 0.89 (03/25/22)  Age: 72 Weight: 102.1kg  Office visit overdue. Pt has scheduled appt on 08/22/22. Appropriate dose. Refill sent.

## 2022-08-19 DIAGNOSIS — L821 Other seborrheic keratosis: Secondary | ICD-10-CM | POA: Diagnosis not present

## 2022-08-19 DIAGNOSIS — C44519 Basal cell carcinoma of skin of other part of trunk: Secondary | ICD-10-CM | POA: Diagnosis not present

## 2022-08-19 DIAGNOSIS — D225 Melanocytic nevi of trunk: Secondary | ICD-10-CM | POA: Diagnosis not present

## 2022-08-19 DIAGNOSIS — C4441 Basal cell carcinoma of skin of scalp and neck: Secondary | ICD-10-CM | POA: Diagnosis not present

## 2022-08-19 DIAGNOSIS — Z85828 Personal history of other malignant neoplasm of skin: Secondary | ICD-10-CM | POA: Diagnosis not present

## 2022-08-19 DIAGNOSIS — L820 Inflamed seborrheic keratosis: Secondary | ICD-10-CM | POA: Diagnosis not present

## 2022-08-19 DIAGNOSIS — L578 Other skin changes due to chronic exposure to nonionizing radiation: Secondary | ICD-10-CM | POA: Diagnosis not present

## 2022-08-19 DIAGNOSIS — C44612 Basal cell carcinoma of skin of right upper limb, including shoulder: Secondary | ICD-10-CM | POA: Diagnosis not present

## 2022-08-19 DIAGNOSIS — D1721 Benign lipomatous neoplasm of skin and subcutaneous tissue of right arm: Secondary | ICD-10-CM | POA: Diagnosis not present

## 2022-08-19 DIAGNOSIS — L570 Actinic keratosis: Secondary | ICD-10-CM | POA: Diagnosis not present

## 2022-08-22 ENCOUNTER — Encounter: Payer: Self-pay | Admitting: Cardiovascular Disease

## 2022-08-22 ENCOUNTER — Ambulatory Visit: Payer: PPO | Attending: Cardiovascular Disease | Admitting: Cardiovascular Disease

## 2022-08-22 VITALS — BP 136/78 | HR 69 | Ht 76.0 in | Wt 223.4 lb

## 2022-08-22 DIAGNOSIS — I872 Venous insufficiency (chronic) (peripheral): Secondary | ICD-10-CM | POA: Diagnosis not present

## 2022-08-22 DIAGNOSIS — D6869 Other thrombophilia: Secondary | ICD-10-CM | POA: Diagnosis not present

## 2022-08-22 DIAGNOSIS — I480 Paroxysmal atrial fibrillation: Secondary | ICD-10-CM

## 2022-08-22 DIAGNOSIS — I10 Essential (primary) hypertension: Secondary | ICD-10-CM

## 2022-08-22 NOTE — Patient Instructions (Signed)
Medication Instructions:  No changes *If you need a refill on your cardiac medications before your next appointment, please call your pharmacy*  Follow-Up: At [facility], you and your health needs are our priority.  As part of our continuing mission to provide you with exceptional heart care, we have created designated Provider Care Teams.  These Care Teams include your primary Cardiologist (physician) and Advanced Practice Providers (APPs -  Physician Assistants and Nurse Practitioners) who all work together to provide you with the care you need, when you need it.  We recommend signing up for the patient portal called "MyChart".  Sign up information is provided on this After Visit Summary.  MyChart is used to connect with patients for Virtual Visits (Telemedicine).  Patients are able to view lab/test results, encounter notes, upcoming appointments, etc.  Non-urgent messages can be sent to your provider as well.   To learn more about what you can do with MyChart, go to [email or web address].    Your next appointment:   1 year(s)- Can be a MyChart visit  Provider:   Thurmon Fair, MD

## 2022-08-22 NOTE — Progress Notes (Signed)
Cardiology Office Note:    Date:  08/22/2022   ID:  [name] [name], DOB [birth year], MRN [id number]  PCP:  Pincus SanesBurns, Stacy J, MD  Cardiologist:  Thurmon FairMihai Toma Erichsen, MD  Electrophysiologist:  None   Referring MD: Pincus SanesBurns, Stacy J, MD   No chief complaint on file.   History of Present Illness:    [name] [name] is a 72 y.o. male with a hx of paroxysmal atrial fibrillation led to a brief hospitalization in March 2019, without clinical recurrence.  He has systemic hypertension and peripheral venous insufficiency, but no structural heart disease other than mild left ventricular hypertrophy.  His left atrium was normal in size and left ventricular systolic function is normal.  He does not have significant valvular abnormalities other than mild aortic valve calcification without stenosis.  He has not been aware of recurrent palpitations.  He wears a smart watch that has not shown atrial fibrillation, but he does not wear it 24 hours a day.  He remains physically active, although recently has had a lot of pain due to arthritis in his right knee.  The patient specifically denies any chest pain at rest exertion, dyspnea at rest or with exertion, orthopnea, paroxysmal nocturnal dyspnea, syncope, palpitations, focal neurological deficits, intermittent claudication, persistent lower extremity edema, unexplained weight gain, cough, hemoptysis or wheezing.   He has bilateral varicose veins (previous ablation of the right greater saphenous) and occasionally develops some swelling calves (the socks leave a mark)  He had normal ABIs and no evidence of obstruction by lower extreme arterial Dopplers last year.  [name] [name].  [place] for the next few years.  He has had ablation of the right greater saphenous vein, but does not have a history of DVT.  He described fatigue with the beta-blockers and  the dose of Lopressor was reduced to 12.5 mg twice daily which led to improvement in his symptoms.     Past Medical History:  Diagnosis Date   Atrial fibrillation with RVR    Converted to NSR with lopressor   Benign prostatic hypertrophy with elevated PSA    Dr Laverle PatterBorden, High grade PIN w/o malignancy   History of colonic polyps 2003, 2007   hyperplastic   Hx of colonoscopy 2012   neg   Hyperlipidemia    Hypertension    Retinal vascular occlusion    Dr Emily FilbertGould    Past Surgical History:  Procedure Laterality Date   APPENDECTOMY     COLONOSCOPY W/ POLYPECTOMY      X2 , Dr Arlyce DiceKaplan   ENDOVENOUS ABLATION SAPHENOUS VEIN W/ LASER Left 09/29/2017   endovenous laser ablation left greater saphenous vein by Josephina GipJames Lawson MD    HERNIA REPAIR  [phone]    Dr Jamey RipaStreck   lichenoid keratosis resected  8/14   LIPOMA EXCISION     X 5   PROSTATE BIOPSY  09/2010   Dr Laverle PatterBorden   stab phlebectomy  Left 01/27/2018   stab phlebectomy 10-20 incisions left leg by Fabienne Brunsharles Fields MD    Current Medications: Current Meds  Medication Sig   apixaban (ELIQUIS) 5 MG TABS tablet Take 1 tablet by mouth twice daily   irbesartan (AVAPRO) 75 MG tablet Take 1 tablet (75 mg total) by mouth daily. Take 1 tablet by mouth once daily. Please contact our office to schedule an appointment for future refills. [phone]. Thank you. 1st  attempt.   metoprolol succinate (TOPROL-XL) 25 MG 24 hr tablet Take 1 tablet (25 mg total) by mouth daily. Take 1 tablet by mouth once daily. Please contact our office to schedule an appointment for future refills. [phone]. Thank you. 1st attempt.   VITAMIN D, CHOLECALCIFEROL, PO Take 1 capsule by mouth daily in the afternoon.     Allergies:   Patient has no known allergies.   Social History   Socioeconomic History   Marital status: Married    Spouse name: Not on file   Number of children: 3   Years of education: Not on file   Highest education level: Not on file  Occupational  History   Not on file  Tobacco Use   Smoking status: Never   Smokeless tobacco: Never  Vaping Use   Vaping Use: Never used  Substance and Sexual Activity   Alcohol use: Yes    Alcohol/week: 3.0 standard drinks of alcohol    Types: 3 Glasses of wine [place] week    Comment: daily   Drug use: No   Sexual activity: Not on file  Other Topics Concern   Not on file  Social History Narrative   Not on file   Social Determinants of Health   Financial Resource Strain: Low Risk  (09/25/2020)   Overall Financial Resource Strain (CARDIA)    Difficulty of Paying Living Expenses: Not hard at all  Food Insecurity: No Food Insecurity (09/25/2020)   Hunger Vital Sign    Worried About Running Out of Food in the Last Year: Never true    Ran Out of Food in the Last Year: Never true  Transportation Needs: No Transportation Needs (09/25/2020)   PRAPARE - Administrator, Civil Service (Medical): No    Lack of Transportation (Non-Medical): No  Physical Activity: Sufficiently Active (09/25/2020)   Exercise Vital Sign    Days of Exercise Jaeveon Week: 5 days    Minutes of Exercise Angelgabriel Session: 30 min  Stress: No Stress Concern Present (09/25/2020)   [employer] of Occupational Health - Occupational Stress Questionnaire    Feeling of Stress : Not at all  Social Connections: Socially Integrated (09/25/2020)   Social Connection and Isolation Panel [NHANES]    Frequency of Communication with Friends and Family: More than three times a week    Frequency of Social Gatherings with Friends and Family: Once a week    Attends Religious Services: More than 4 times [place] year    Active Member of [employer] or Organizations: Yes    Attends Engineer, structural: More than 4 times [place] year    Marital Status: Married     Family History: The patient's family history includes Alzheimer's disease in his mother; Heart attack (age of onset: 79) in his father; Hypertension in his brother and maternal  grandmother. There is no history of Diabetes, Stroke, Colon cancer, Esophageal cancer, Pancreatic cancer, Rectal cancer, or Stomach cancer.  ROS:   Please see the history of present illness.    All other systems are reviewed and are negative.  EKGs/Labs/Other Studies Reviewed:    EKG:  EKG is ordered today.  Shows normal sinus rhythm and incomplete right bundle branch block with a QRS duration of 118 ms, otherwise normal tracing.  Intraventricular conduction delay is new since last year. Recent Labs: 03/25/2022: ALT 25; BUN 12; Creatinine, Ser 0.89; Hemoglobin 14.3; Platelets 200.0; Potassium 4.1; Sodium 139; TSH 1.55  Recent Lipid Panel    Component Value  Date/Time   CHOL 169 03/25/2022 0859   TRIG 48.0 03/25/2022 0859   HDL 80.10 03/25/2022 0859   CHOLHDL 2 03/25/2022 0859   VLDL 9.6 03/25/2022 0859   LDLCALC 80 03/25/2022 0859   LDLDIRECT 108.9 05/17/2009 0000    Physical Exam:    VS:  BP 136/78 (BP Location: Left Arm, Patient Position: Sitting, Cuff Size: Normal)   Pulse 69   Ht 6\' 4"  (1.93 m)   Wt 223 lb 6.4 oz (101.3 kg)   SpO2 97%   BMI 27.19 kg/m     Wt Readings from Last 3 Encounters:  08/22/22 223 lb 6.4 oz (101.3 kg)  07/07/22 225 lb (102.1 kg)  04/01/22 222 lb (100.7 kg)      General: Alert, oriented x3, no distress, appears fit, younger than stated age. Head: no evidence of trauma, PERRL, EOMI, no exophtalmos or lid lag, no myxedema, no xanthelasma; normal ears, nose and oropharynx Neck: normal jugular venous pulsations and no hepatojugular reflux; brisk carotid pulses without delay and no carotid bruits Chest: clear to auscultation, no signs of consolidation by percussion or palpation, normal fremitus, symmetrical and full respiratory excursions Cardiovascular: normal position and quality of the apical impulse, regular rhythm, normal first and second heart sounds, 1/6 early peaking aortic ejection murmur, no diastolic murmurs, rubs or gallops Abdomen: no  tenderness or distention, no masses by palpation, no abnormal pulsatility or arterial bruits, normal bowel sounds, no hepatosplenomegaly Extremities: no clubbing, cyanosis or edema; has some varicose veins especially obvious in the left lower extremity; 2+ radial, ulnar and brachial pulses bilaterally; 2+ right femoral, posterior tibial and dorsalis pedis pulses; 2+ left femoral, posterior tibial and dorsalis pedis pulses; no subclavian or femoral bruits Neurological: grossly nonfocal Psych: Normal mood and affect    ASSESSMENT:    1. Paroxysmal atrial fibrillation   2. Peripheral venous insufficiency   3. Essential hypertension   4. Acquired thrombophilia      PLAN:    In order of problems listed above:  AFib: Asymptomatic, no episodes recorded by smart watch.  On anticoagulation.  Tolerates only a very low-dose of beta-blocker. Aortic sclerosis: Explains his short systolic murmur.  No symptoms of aortic stenosis.  Asked him to call us if he develops exertional angina/dyspnea/dizziness-syncope. Peripheral venous insufficiency: He has minimal edema.  Has history of previous venous ablation.  He wears compression stockings on longer trips or flights.  Keep legs elevated whenever not standing. HTN: Very well-controlled. Eliquis: Well-tolerated without bleeding complications.  He would like to occasionally take NSAIDs for his right knee and I think it is okay as long as he does it sparingly.   Medication Adjustments/Labs and Tests Ordered: Current medicines are reviewed at length with the patient today.  Concerns regarding medicines are outlined above.  Orders Placed This Encounter  Procedures   EKG 12-Lead   No orders of the defined types were placed in this encounter.   Patient Instructions  Medication Instructions:  No changes *If you need a refill on your cardiac medications before your next appointment, please call your pharmacy*  Follow-Up: At [facility], you  and your health needs are our priority.  As part of our continuing mission to provide you with exceptional heart care, we have created designated Provider Care Teams.  These Care Teams include your primary Cardiologist (physician) and Advanced Practice Providers (APPs -  Physician Assistants and Nurse Practitioners) who all work together to provide you with the care you need, when you  need it.  We recommend signing up for the patient portal called "MyChart".  Sign up information is provided on this After Visit Summary.  MyChart is used to connect with patients for Virtual Visits (Telemedicine).  Patients are able to view lab/test results, encounter notes, upcoming appointments, etc.  Non-urgent messages can be sent to your provider as well.   To learn more about what you can do with MyChart, go to [email or web address].    Your next appointment:   1 year(s)- Can be a MyChart visit  Provider:   Thurmon Fair, MD       Signed, Thurmon Fair, MD  08/22/2022 2:09 PM    [facility]

## 2022-09-10 ENCOUNTER — Other Ambulatory Visit: Payer: Self-pay | Admitting: Cardiovascular Disease

## 2022-09-10 ENCOUNTER — Other Ambulatory Visit: Payer: Self-pay

## 2022-09-10 MED ORDER — METOPROLOL SUCCINATE ER 25 MG PO TB24: 25.0000 mg | ORAL_TABLET | Freq: Every day | ORAL | 3 refills | Status: DC

## 2022-09-25 ENCOUNTER — Telehealth: Payer: Self-pay

## 2022-09-25 NOTE — Telephone Encounter (Signed)
Called patient to schedule Medicare Annual Wellness Visit (AWV). Left message for patient to call back and schedule Medicare Annual Wellness Visit (AWV).  Last date of AWV: 12/19/21  Please schedule an appointment at any time On Annual Wellness Visit Schedule.

## 2022-11-14 DIAGNOSIS — L820 Inflamed seborrheic keratosis: Secondary | ICD-10-CM | POA: Diagnosis not present

## 2022-11-14 DIAGNOSIS — L821 Other seborrheic keratosis: Secondary | ICD-10-CM | POA: Diagnosis not present

## 2022-11-14 DIAGNOSIS — L578 Other skin changes due to chronic exposure to nonionizing radiation: Secondary | ICD-10-CM | POA: Diagnosis not present

## 2022-11-14 DIAGNOSIS — D485 Neoplasm of uncertain behavior of skin: Secondary | ICD-10-CM | POA: Diagnosis not present

## 2022-11-14 DIAGNOSIS — L905 Scar conditions and fibrosis of skin: Secondary | ICD-10-CM | POA: Diagnosis not present

## 2022-11-14 DIAGNOSIS — L570 Actinic keratosis: Secondary | ICD-10-CM | POA: Diagnosis not present

## 2022-12-08 ENCOUNTER — Telehealth: Payer: Self-pay | Admitting: *Deleted

## 2022-12-08 NOTE — Telephone Encounter (Signed)
I connected with [name] [name] on 7/22 at 1429 by telephone and verified that I am speaking with the correct person using two identifiers. According to the patient's chart they are due for physical in nov with [facility]. Pt has moved to [place] but does not want to remove dr burns as pcp until he finds new primary there. Nothing further was needed at the end of our conversation.

## 2022-12-23 DIAGNOSIS — R739 Hyperglycemia, unspecified: Secondary | ICD-10-CM | POA: Diagnosis not present

## 2022-12-23 DIAGNOSIS — N400 Enlarged prostate without lower urinary tract symptoms: Secondary | ICD-10-CM | POA: Diagnosis not present

## 2022-12-23 DIAGNOSIS — I10 Essential (primary) hypertension: Secondary | ICD-10-CM | POA: Diagnosis not present

## 2022-12-23 DIAGNOSIS — I480 Paroxysmal atrial fibrillation: Secondary | ICD-10-CM | POA: Diagnosis not present

## 2023-01-26 ENCOUNTER — Other Ambulatory Visit: Payer: Self-pay | Admitting: Cardiovascular Disease

## 2023-01-26 DIAGNOSIS — I480 Paroxysmal atrial fibrillation: Secondary | ICD-10-CM

## 2023-01-26 NOTE — Telephone Encounter (Signed)
Prescription refill request for Eliquis received. Indication: Afib  Last office visit: 08/22/22 ([facility])  Scr: 0.89 (03/25/22)  Age: 72 Weight: 101.3kg  Appropriate dose. Refill sent.

## 2023-03-04 ENCOUNTER — Telehealth: Payer: Self-pay | Admitting: Internal Medicine

## 2023-03-04 NOTE — Telephone Encounter (Signed)
Contacted [name] [name] to schedule their annual wellness visit. Patient declined to schedule AWV at this time. Transferred care  [facility] Guide [facility] AWV TEAM Direct Dial: [phone]'

## 2023-04-13 DIAGNOSIS — M216X1 Other acquired deformities of right foot: Secondary | ICD-10-CM | POA: Diagnosis not present

## 2023-04-13 DIAGNOSIS — M7731 Calcaneal spur, right foot: Secondary | ICD-10-CM | POA: Diagnosis not present

## 2023-04-21 ENCOUNTER — Ambulatory Visit (HOSPITAL_BASED_OUTPATIENT_CLINIC_OR_DEPARTMENT_OTHER): Payer: PPO | Admitting: Student

## 2023-04-21 ENCOUNTER — Encounter (HOSPITAL_BASED_OUTPATIENT_CLINIC_OR_DEPARTMENT_OTHER): Payer: Self-pay | Admitting: Student

## 2023-04-21 DIAGNOSIS — M25561 Pain in right knee: Secondary | ICD-10-CM

## 2023-04-21 DIAGNOSIS — M1711 Unilateral primary osteoarthritis, right knee: Secondary | ICD-10-CM

## 2023-04-21 MED ORDER — LIDOCAINE HCL 1 % IJ SOLN
4.0000 mL | INTRAMUSCULAR | Status: AC | PRN
  Administered 2023-04-21: 4 mL

## 2023-04-21 MED ORDER — TRIAMCINOLONE ACETONIDE 40 MG/ML IJ SUSP
2.0000 mL | INTRAMUSCULAR | Status: AC | PRN
  Administered 2023-04-21: 2 mL via INTRA_ARTICULAR

## 2023-04-21 NOTE — Progress Notes (Signed)
Chief Complaint: Right knee pain     History of Present Illness:    [name] [name] is a 72 y.o. male here today for evaluation of his right knee.  He is a patient of [name] [name] and Dr. Magnus [name] and was last seen for this knee in March.  He did have a cortisone injection performed on 07/07/2022 which did get him a few months of good relief.  MRI of the right knee earlier this year did reveal full-thickness cartilage loss of the patella and trochlea.  Reports that pain has returned over the past few months which she is located below the kneecap and is made worse particularly when climbing stairs.  He does use Voltaren gel as he is on Eliquis and cannot take oral NSAIDs.  Has also been dealing with right ankle issues which she received an injection for a few weeks ago.  He lives near [place], [place] but continues to follow with doctors in this area as he is not able to get established there.   Surgical History:   None  PMH/PSH/Family History/Social History/Meds/Allergies:    Past Medical History:  Diagnosis Date   Atrial fibrillation with RVR (HCC)    Converted to NSR with lopressor   Benign prostatic hypertrophy with elevated PSA    Dr Laverle Patter, High grade PIN w/o malignancy   History of colonic polyps 2003, 2007   hyperplastic   Hx of colonoscopy 2012   neg   Hyperlipidemia    Hypertension    Retinal vascular occlusion    Dr Emily Filbert   Past Surgical History:  Procedure Laterality Date   APPENDECTOMY     COLONOSCOPY W/ POLYPECTOMY      X2 , Dr Arlyce Dice   ENDOVENOUS ABLATION SAPHENOUS VEIN W/ LASER Left 09/29/2017   endovenous laser ablation left greater saphenous vein by Josephina Gip MD    HERNIA REPAIR  [phone]    Dr Jamey Ripa   lichenoid keratosis resected  8/14   LIPOMA EXCISION     X 5   PROSTATE BIOPSY  09/2010   Dr Laverle Patter   stab phlebectomy  Left 01/27/2018   stab phlebectomy 10-20 incisions left leg by Fabienne Bruns MD   Social History    Socioeconomic History   Marital status: Married    Spouse name: Not on file   Number of children: 3   Years of education: Not on file   Highest education level: Not on file  Occupational History   Not on file  Tobacco Use   Smoking status: Never   Smokeless tobacco: Never  Vaping Use   Vaping status: Never Used  Substance and Sexual Activity   Alcohol use: Yes    Alcohol/week: 3.0 standard drinks of alcohol    Types: 3 Glasses of wine Owen week    Comment: daily   Drug use: No   Sexual activity: Not on file  Other Topics Concern   Not on file  Social History Narrative   Not on file   Social Determinants of Health   Financial Resource Strain: Low Risk  (09/25/2020)   Overall Financial Resource Strain (CARDIA)    Difficulty of Paying Living Expenses: Not hard at all  Food Insecurity: No Food Insecurity (09/25/2020)   Hunger Vital Sign    Worried About Running Out of Food  in the Last Year: Never true    Ran Out of Food in the Last Year: Never true  Transportation Needs: No Transportation Needs (09/25/2020)   PRAPARE - Administrator, Civil Service (Medical): No    Lack of Transportation (Non-Medical): No  Physical Activity: Sufficiently Active (09/25/2020)   Exercise Vital Sign    Days of Exercise Amond Week: 5 days    Minutes of Exercise Caroll Session: 30 min  Stress: No Stress Concern Present (09/25/2020)   [employer] of Occupational Health - Occupational Stress Questionnaire    Feeling of Stress : Not at all  Social Connections: Socially Integrated (09/25/2020)   Social Connection and Isolation Panel [NHANES]    Frequency of Communication with Friends and Family: More than three times a week    Frequency of Social Gatherings with Friends and Family: Once a week    Attends Religious Services: More than 4 times Kaz year    Active Member of [employer] or Organizations: Yes    Attends Engineer, structural: More than 4 times Venus year    Marital Status:  Married   Family History  Problem Relation Age of Onset   Alzheimer's disease Mother    Heart attack Father 45   Hypertension Brother    Hypertension Maternal Grandmother    Diabetes Neg Hx    Stroke Neg Hx    Colon cancer Neg Hx    Esophageal cancer Neg Hx    Pancreatic cancer Neg Hx    Rectal cancer Neg Hx    Stomach cancer Neg Hx    No Known Allergies Current Outpatient Medications  Medication Sig Dispense Refill   apixaban (ELIQUIS) 5 MG TABS tablet Take 1 tablet by mouth twice daily 180 tablet 1   irbesartan (AVAPRO) 75 MG tablet Take 1 tablet by mouth once daily 90 tablet 3   metoprolol succinate (TOPROL-XL) 25 MG 24 hr tablet Take 1 tablet (25 mg total) by mouth daily. Take 1 tablet by mouth once daily. 90 tablet 3   VITAMIN D, CHOLECALCIFEROL, PO Take 1 capsule by mouth daily in the afternoon.     No current facility-administered medications for this visit.   No results found.  Review of Systems:   A ROS was performed including pertinent positives and negatives as documented in the HPI.  Physical Exam :   Constitutional: NAD and appears stated age Neurological: Alert and oriented Psych: Appropriate affect and cooperative There were no vitals taken for this visit.   Comprehensive Musculoskeletal Exam:    Right knee exam reveals no obvious deformity, erythema, or edema.  Active range of motion from 0-110 degrees.  Positive lateral joint line tenderness.  Knee flexion extension strength is 5/5.  No instability with varus valgus stress.  Imaging:     Assessment:   72 y.o. male with right knee patellofemoral osteoarthritis.  His pain has become more bothersome lately and has been affecting his gait for the past few months.  He did get good relief from last cortisone injection so I have offered to repeat this today.  Patient is agreeable and 2 cc Kenalog and 4 cc lidocaine was injected using sterile technique without any complication.  Will have him assess relief with  the injection and continue following with [name] [name] and Dr. Magnus [name].  Plan :    -Right knee cortisone injection performed today after patient consent -Return to clinic as needed     Procedure Note  Patient: [name] [name]  Date of Birth: 1950/09/26           MRN: [id number]             Visit Date: 04/21/2023  Procedures: Visit Diagnoses:  1. Patellofemoral arthritis of right knee     Large Joint Inj: R knee on 04/21/2023 11:38 PM Indications: pain Details: 22 G 1.5 in needle, anterolateral approach Medications: 4 mL lidocaine 1 %; 2 mL triamcinolone acetonide 40 MG/ML Outcome: tolerated well, no immediate complications Procedure, treatment alternatives, risks and benefits explained, specific risks discussed. Consent was given by the patient. Immediately prior to procedure a time out was called to verify the correct patient, procedure, equipment, support staff and site/side marked as required. Patient was prepped and draped in the usual sterile fashion.      I personally saw and evaluated the patient, and participated in the management and treatment plan.  Hazle Nordmann, PA-C Orthopedics

## 2023-05-05 DIAGNOSIS — M7731 Calcaneal spur, right foot: Secondary | ICD-10-CM | POA: Diagnosis not present

## 2023-05-05 DIAGNOSIS — J069 Acute upper respiratory infection, unspecified: Secondary | ICD-10-CM | POA: Diagnosis not present

## 2023-05-05 DIAGNOSIS — Z6827 Body mass index (BMI) 27.0-27.9, adult: Secondary | ICD-10-CM | POA: Diagnosis not present

## 2023-08-19 ENCOUNTER — Other Ambulatory Visit: Payer: Self-pay | Admitting: Cardiovascular Disease

## 2023-08-19 DIAGNOSIS — I480 Paroxysmal atrial fibrillation: Secondary | ICD-10-CM

## 2023-08-20 NOTE — Telephone Encounter (Signed)
 Office visit and labs overdue. Note placed on upcoming appt to make pt aware of overdue labs and order labs (scheduled appt is virtual) . Refill sent.

## 2023-08-20 NOTE — Telephone Encounter (Signed)
 Prescription refill request for Eliquis received. Indication: afib  Last office visit: [place] 08/11/2022 Scr: 0.89, 03/25/2022 Age: 73 yo  Weight: 101.3 kg   Scheduled to see Dr. Royann Shivers on 5/6

## 2023-08-29 ENCOUNTER — Other Ambulatory Visit: Payer: Self-pay | Admitting: Cardiovascular Disease

## 2023-09-03 ENCOUNTER — Encounter: Payer: Self-pay | Admitting: Cardiovascular Disease

## 2023-09-22 ENCOUNTER — Telehealth: Payer: PPO | Admitting: Cardiovascular Disease

## 2023-09-24 ENCOUNTER — Encounter: Payer: Self-pay | Admitting: Sports Medicine

## 2023-09-24 ENCOUNTER — Ambulatory Visit: Admitting: Sports Medicine

## 2023-09-24 DIAGNOSIS — M7731 Calcaneal spur, right foot: Secondary | ICD-10-CM | POA: Diagnosis not present

## 2023-09-24 DIAGNOSIS — G8929 Other chronic pain: Secondary | ICD-10-CM

## 2023-09-24 DIAGNOSIS — M79671 Pain in right foot: Secondary | ICD-10-CM

## 2023-09-24 DIAGNOSIS — M7661 Achilles tendinitis, right leg: Secondary | ICD-10-CM | POA: Diagnosis not present

## 2023-09-24 DIAGNOSIS — M7751 Other enthesopathy of right foot: Secondary | ICD-10-CM | POA: Diagnosis not present

## 2023-09-24 MED ORDER — PREDNISONE 20 MG PO TABS: 20.0000 mg | ORAL_TABLET | Freq: Every day | ORAL | 0 refills | Status: AC

## 2023-09-24 MED ORDER — PREDNISONE 20 MG PO TABS: ORAL_TABLET | ORAL | 0 refills | Status: DC

## 2023-09-24 NOTE — Progress Notes (Signed)
 Patient says that he had x-rays in November 2024 which showed a heel spur. He says he had two injections that seemed to help the swelling in his heel, but did not do much for his pain. He has the worst pain with activities such as going up stairs, but will have pain occasionally at rest. He does mention that changing his shoes will flare up his pain as well. He is not currently taking medicine for it.   Patient was instructed in 10 minutes of therapeutic exercises for right heel to improve strength, ROM and function according to my instructions and plan of care by a Certified Athletic Trainer during the office visit. A customized handout was provided and demonstration of proper technique shown and discussed. Patient did perform exercises and demonstrate understanding through teachback.  All questions discussed and answered.

## 2023-09-24 NOTE — Progress Notes (Signed)
 [name] [name] - 73 y.o. male MRN [id number]  Date of birth: Dec 25, 1950  Office Visit Note: Visit Date: 09/24/2023 PCP: Pcp, No Referred by: No ref. provider found  Subjective: Chief Complaint  Patient presents with   Right Heel - Pain   HPI: [name] [name] is a pleasant 73 y.o. male who presents today for evaluation of right chronic heel pain and swelling.   His pain began around September without inciting event or injury.  He did bump the heel prior to this but does not feel this was a direct correlation with his pain.  He was seen at an outside orthopedic office near his home and had x-rays as well as an injection in the back of the heel.  He states this helped with the swelling quite significantly in his heel but was not significantly helpful for his pain.  The heel is painful with activity such as going up and down steps but also gets pain at rest.  Certain shoes will flareup his pain as well.  He is not taking any medication consistently for this.  He does need to avoid consistent NSAIDs given his cardiac conditions. He is on Eliquis .  Pertinent ROS were reviewed with the patient and found to be negative unless otherwise specified above in HPI.   Assessment & Plan: Visit Diagnoses:  1. Chronic heel pain, right   2. Heel spur, right   3. Retrocalcaneal bursitis (back of heel), right   4. Achilles tendinitis, right leg    Plan: Impression is acute on chronic right heel pain which is multifactorial both with a small Haglund's spur as well as distal Achilles tendinopathy.  He does have reciprocal retrocalcaneal bursitis, which sounds like was much worse but did improve with an injection into the heel in the past.  We discussed all treatment options including surgical and nonsurgical.  We will start with conservative management.  He may continue his heel lift/cushion, we will start him on a short course of prednisone  20 mg x 7 days only to help with the inflammatory component.  We also  discussed the importance of therapy to help elongate his Achilles and support the posterior heel/posterior chain.  He does prefer home therapy given his travel.  We printed out a customized handout for him and my athletic trainer, Lonzell Robin, did review these exercises with him in the room today.  He will perform these once daily for the next 4-6 weeks.  We did trial extracorporeal shockwave therapy for him as well, he tolerated well and found good benefit.  If he receives significant benefit over the next few days to weeks, he may consider additional treatments.  We did discuss this given he is about 4 hours from our office.  He may call or message if he wishes to proceed with additional treatment.  Follow-up: Return if symptoms worsen or fail to improve.   Meds & Orders:  Meds ordered this encounter  Medications   DISCONTD: predniSONE  (DELTASONE ) 20 MG tablet    Sig: Take 2 tablets (40mg ) daily for 3 days, then take 1 tablet (20mg  total) daily for 4 additional days (1-week total)    Dispense:  10 tablet    Refill:  0   predniSONE  (DELTASONE ) 20 MG tablet    Sig: Take 1 tablet (20 mg total) by mouth daily with breakfast for 7 days.    Dispense:  7 tablet    Refill:  0   No orders of the defined types were  placed in this encounter.    Procedures: No procedures performed      Clinical History: No specialty comments available.  He reports that he has never smoked. He has never used smokeless tobacco. No results for input(s): "HGBA1C", "LABURIC" in the last 8760 hours.  Objective:    Physical Exam  Gen: Well-appearing, in no acute distress; non-toxic CV: Well-perfused. Warm.  Resp: Breathing unlabored on room air; no wheezing. Psych: Fluid speech in conversation; appropriate affect; normal thought process  Ortho Exam - Right foot/ankle: + TTP over the superior calcaneus as well as the distal Achilles insertion.  There is a small Achilles tendon contracture with dorsiflexion of about 90  degrees.  There is preserved longitudinal arch.  There is a small retrocalcaneal bursitis without redness.  Imaging:  *2 view x-ray of the right heel, os calcis, from outside facility from 04/13/2023 including AP and lateral film were independently reviewed and interpreted by myself today.  X-rays demonstrate a small Haglund deformity as well as calcaneal enthesophytes at the distal aspect of the Achilles insertion.  There is preserved longitudinal arch.  Otherwise no acute fracture or otherwise acute bony abnormality noted.  Past Medical/Family/Surgical/Social History: Medications & Allergies reviewed [facility], new medications updated. Patient Active Problem List   Diagnosis Date Noted   Right hip pain 04/01/2022   Poor posture 02/06/2022   Mid back pain 02/06/2022   Right foot pain 02/06/2022   Secondary hypercoagulable state (HCC) 03/28/2021   Ingrown toenail of right foot 03/23/2019   Current use of long term anticoagulation 01/20/2018   EtOH dependence (HCC) 07/30/2017   Paroxysmal atrial fibrillation (HCC) 07/29/2017   Varicose veins of left lower extremity with complications 03/24/2017   Greater trochanteric bursitis of right hip 01/02/2017   Prediabetes 09/20/2016   Rotator cuff syndrome of right shoulder 09/27/2015   Vitamin D  deficiency 09/07/2014   Lichenoid keratosis 09/01/2013   Superficial thrombosis of leg 03/28/2013   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   Inguinal hernia recurrent unilateral 08/10/2009   History of colonic polyps 05/15/2008   Essential hypertension 05/15/2008   HYPERLIPIDEMIA 05/12/2007   Elevated prostate specific antigen (PSA) 05/12/2007   Past Medical History:  Diagnosis Date   Atrial fibrillation with RVR (HCC)    Converted to NSR with lopressor    Benign prostatic hypertrophy with elevated PSA    Dr Rozanne Corners, High grade PIN w/o malignancy   History of colonic polyps 2003, 2007   hyperplastic   Hx of colonoscopy 2012   neg   Hyperlipidemia     Hypertension    Retinal vascular occlusion    Dr Joanne Muckle   Family History  Problem Relation Age of Onset   Alzheimer's disease Mother    Heart attack Father 46   Hypertension Brother    Hypertension Maternal Grandmother    Diabetes Neg Hx    Stroke Neg Hx    Colon cancer Neg Hx    Esophageal cancer Neg Hx    Pancreatic cancer Neg Hx    Rectal cancer Neg Hx    Stomach cancer Neg Hx    Past Surgical History:  Procedure Laterality Date   APPENDECTOMY     COLONOSCOPY W/ POLYPECTOMY      X2 , Dr Arvie Latus   ENDOVENOUS ABLATION SAPHENOUS VEIN W/ LASER Left 09/29/2017   endovenous laser ablation left greater saphenous vein by Merced Stair MD    HERNIA REPAIR  [phone]    Dr Linell Rhymes   lichenoid keratosis resected  8/14  LIPOMA EXCISION     X 5   PROSTATE BIOPSY  09/2010   Dr Rozanne Corners   stab phlebectomy  Left 01/27/2018   stab phlebectomy 10-20 incisions left leg by Stacia Dynes MD   Social History   Occupational History   Not on file  Tobacco Use   Smoking status: Never   Smokeless tobacco: Never  Vaping Use   Vaping status: Never Used  Substance and Sexual Activity   Alcohol use: Yes    Alcohol/week: 3.0 standard drinks of alcohol    Types: 3 Glasses of wine Brenton week    Comment: daily   Drug use: No   Sexual activity: Not on file

## 2023-09-25 ENCOUNTER — Other Ambulatory Visit: Payer: Self-pay | Admitting: Cardiovascular Disease

## 2023-09-25 ENCOUNTER — Ambulatory Visit: Admitting: Sports Medicine

## 2023-09-25 NOTE — Telephone Encounter (Signed)
 Please advise on this. I don't know if we should go ahead and refill or if it should go to his new Cardiologist.

## 2023-11-14 ENCOUNTER — Other Ambulatory Visit: Payer: Self-pay | Admitting: Cardiovascular Disease

## 2023-11-14 DIAGNOSIS — I480 Paroxysmal atrial fibrillation: Secondary | ICD-10-CM

## 2023-11-16 ENCOUNTER — Other Ambulatory Visit: Payer: Self-pay | Admitting: Cardiovascular Disease

## 2023-11-16 DIAGNOSIS — I480 Paroxysmal atrial fibrillation: Secondary | ICD-10-CM

## 2023-11-17 NOTE — Telephone Encounter (Signed)
 Prescription refill request for Eliquis  received. Indication:afib Last office visit:needs appt Scr:na Age: 73 Weight:101.3  kg  Prescription refilled

## 2023-12-29 ENCOUNTER — Other Ambulatory Visit: Payer: Self-pay | Admitting: Cardiovascular Disease

## 2024-03-21 ENCOUNTER — Encounter: Payer: Self-pay | Admitting: Radiology
